# Patient Record
Sex: Female | Born: 1950 | Race: White | Hispanic: No | Marital: Married | Smoking: Never smoker
Health system: Southern US, Community
[De-identification: ages and names within clinical notes are randomized; demographics above are authoritative.]

## PROBLEM LIST (undated history)

## (undated) DIAGNOSIS — C50919 Malignant neoplasm of unspecified site of unspecified female breast: Secondary | ICD-10-CM

## (undated) DIAGNOSIS — C801 Malignant (primary) neoplasm, unspecified: Secondary | ICD-10-CM

## (undated) DIAGNOSIS — K219 Gastro-esophageal reflux disease without esophagitis: Secondary | ICD-10-CM

## (undated) DIAGNOSIS — M1990 Unspecified osteoarthritis, unspecified site: Secondary | ICD-10-CM

## (undated) DIAGNOSIS — K759 Inflammatory liver disease, unspecified: Secondary | ICD-10-CM

## (undated) DIAGNOSIS — R112 Nausea with vomiting, unspecified: Secondary | ICD-10-CM

## (undated) DIAGNOSIS — Z9889 Other specified postprocedural states: Secondary | ICD-10-CM

## (undated) HISTORY — PX: VULVECTOMY: SHX1086

## (undated) HISTORY — DX: Malignant neoplasm of unspecified site of unspecified female breast: C50.919

## (undated) HISTORY — DX: Malignant (primary) neoplasm, unspecified: C80.1

## (undated) HISTORY — PX: BREAST BIOPSY: SHX20

## (undated) HISTORY — PX: OTHER SURGICAL HISTORY: SHX169

---

## 1995-03-18 HISTORY — PX: ABDOMINAL HYSTERECTOMY: SHX81

## 2004-01-30 ENCOUNTER — Ambulatory Visit: Payer: Self-pay | Admitting: Unknown Physician Specialty

## 2004-02-21 ENCOUNTER — Ambulatory Visit: Payer: Self-pay | Admitting: Gynecologic Oncology

## 2004-03-01 ENCOUNTER — Ambulatory Visit: Payer: Self-pay | Admitting: Unknown Physician Specialty

## 2004-03-20 ENCOUNTER — Ambulatory Visit: Payer: Self-pay | Admitting: Oncology

## 2005-02-17 ENCOUNTER — Ambulatory Visit: Payer: Self-pay

## 2005-02-19 ENCOUNTER — Ambulatory Visit: Payer: Self-pay

## 2005-12-16 ENCOUNTER — Ambulatory Visit: Payer: Self-pay | Admitting: Vascular Surgery

## 2005-12-22 ENCOUNTER — Ambulatory Visit: Payer: Self-pay | Admitting: Unknown Physician Specialty

## 2006-01-28 ENCOUNTER — Ambulatory Visit: Payer: Self-pay

## 2006-11-06 ENCOUNTER — Ambulatory Visit: Payer: Self-pay | Admitting: Internal Medicine

## 2007-02-24 ENCOUNTER — Ambulatory Visit: Payer: Self-pay

## 2007-04-22 ENCOUNTER — Ambulatory Visit: Payer: Self-pay | Admitting: Internal Medicine

## 2007-04-23 ENCOUNTER — Ambulatory Visit: Payer: Self-pay | Admitting: Internal Medicine

## 2008-06-01 ENCOUNTER — Ambulatory Visit: Payer: Self-pay | Admitting: Internal Medicine

## 2008-09-13 ENCOUNTER — Ambulatory Visit: Payer: Self-pay

## 2008-09-19 IMAGING — CT CT ABD-PELV W/ CM
1 of 2 series · 15 of 32 positions shown, 19 images · non-contrast
Comparison: none

REASON FOR EXAM: abdominal pain   CALL report [phone]
COMMENTS:

PROCEDURE:     CT  - CT ABDOMEN / PELVIS  W  - December 16, 2005  [DATE]
RESULT:
HISTORY: Abdominal pain.

[Series 2: abdomen · axial · 0.56mm/px · z∈[-220,+196]mm · 15 of 58 slices shown, 19 images]
[im 3/58  soft-tissue]
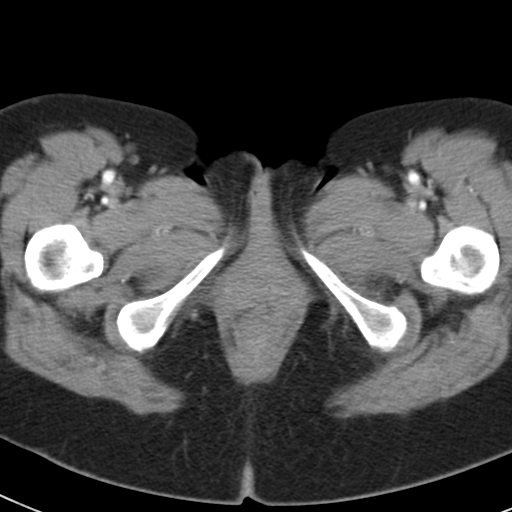
[im 3/58  bone]
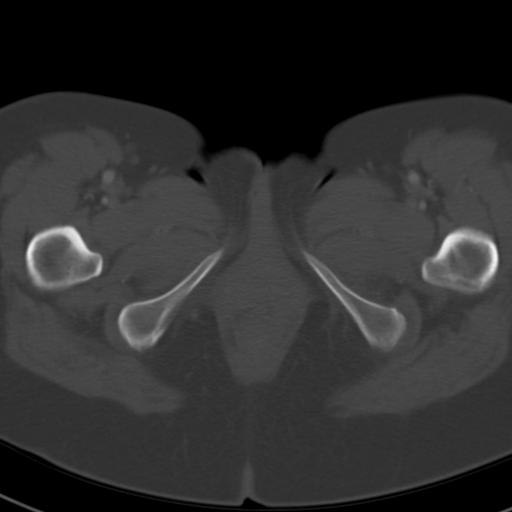
[im 7/58  soft-tissue]
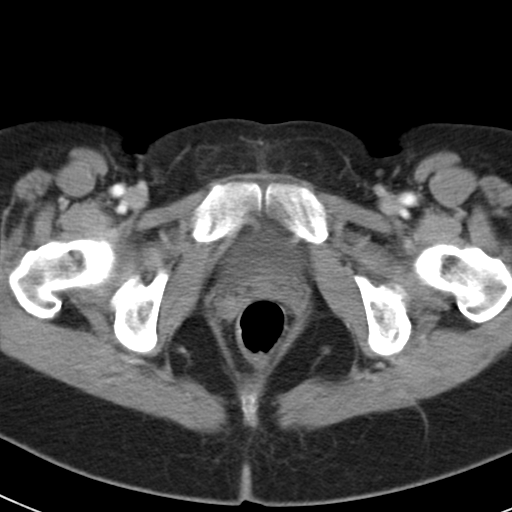
[im 11/58  soft-tissue]
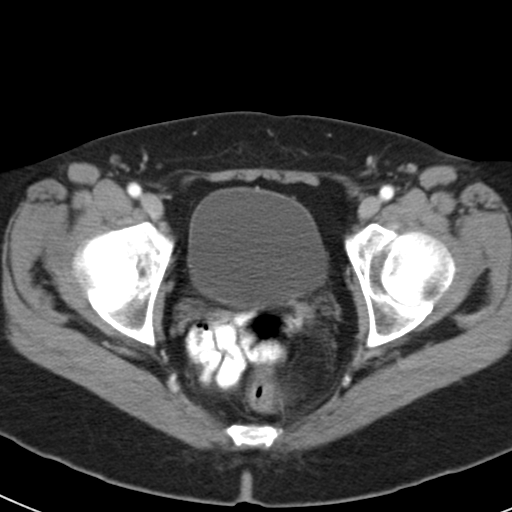
[im 16/58  soft-tissue]
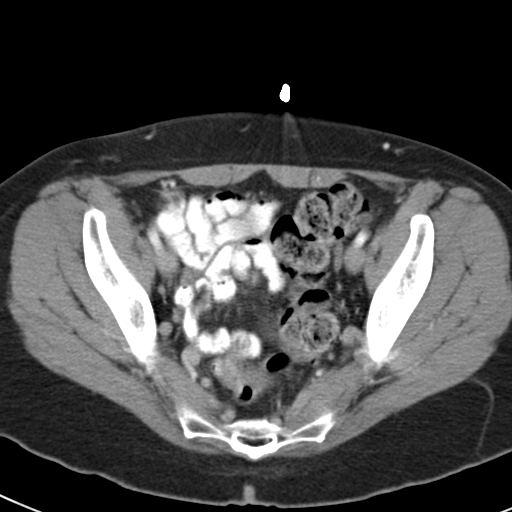
[im 20/58  soft-tissue]
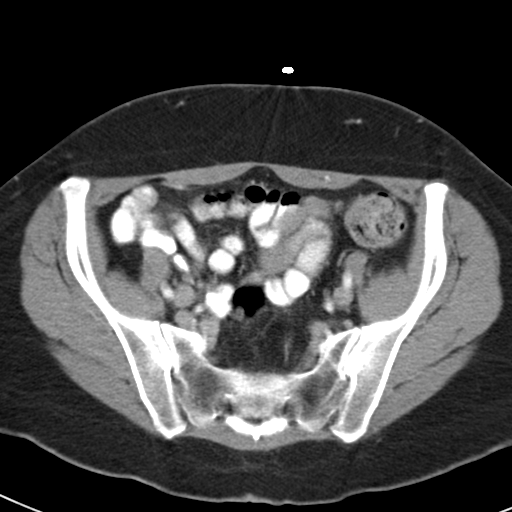
[im 25/58  soft-tissue]
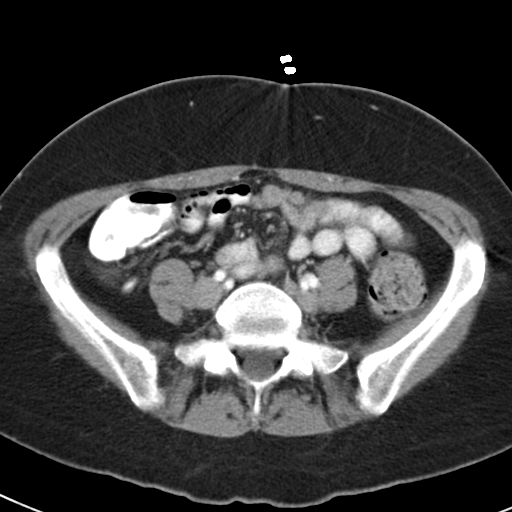
[im 29/58  soft-tissue]
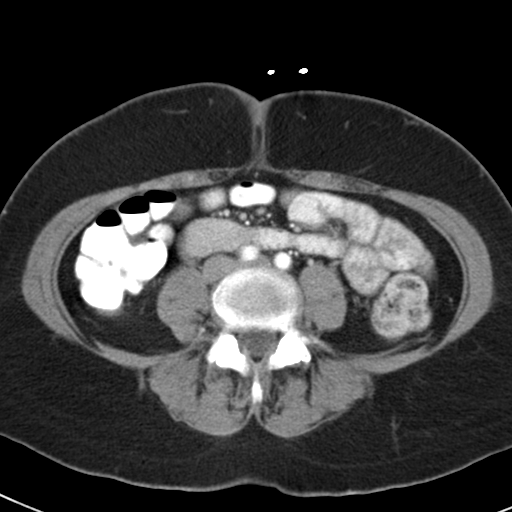
[im 33/58  soft-tissue]
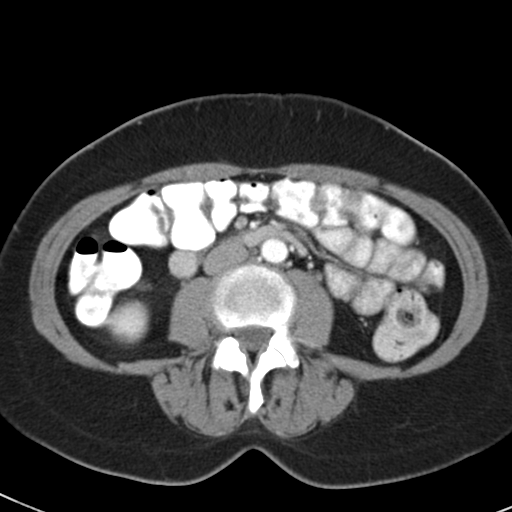
[im 38/58  soft-tissue]
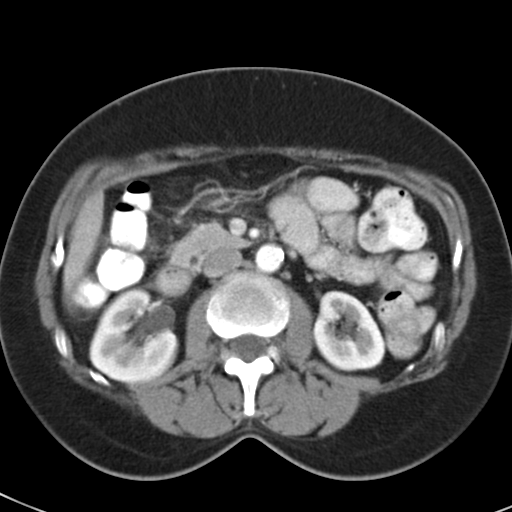
[im 38/58  bone]
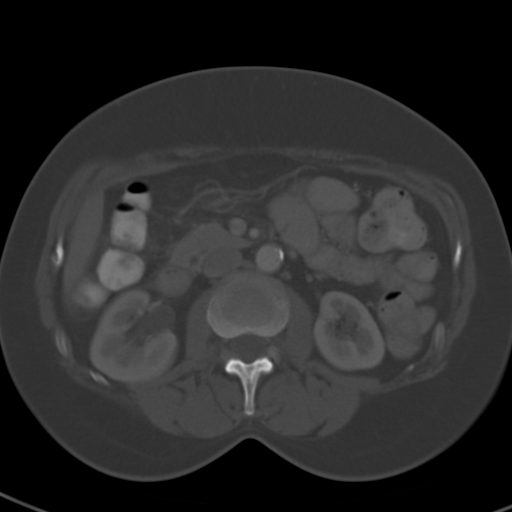
[im 42/58  soft-tissue]
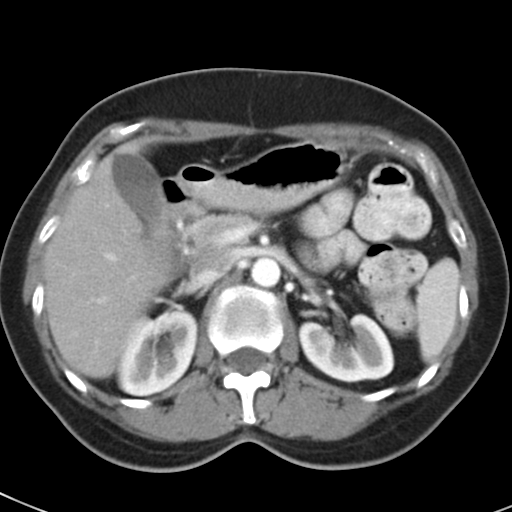
[im 47/58  soft-tissue]
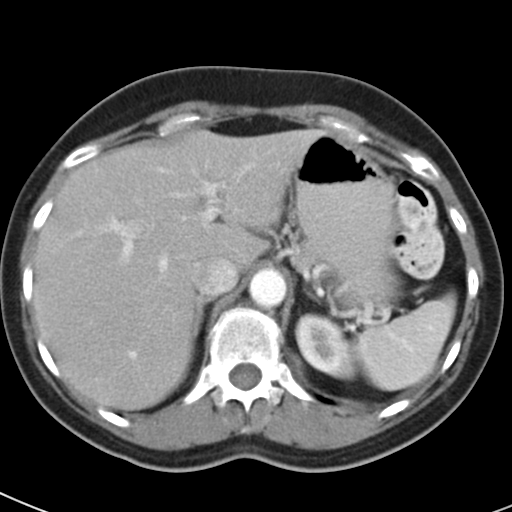
[im 49/58  lung]
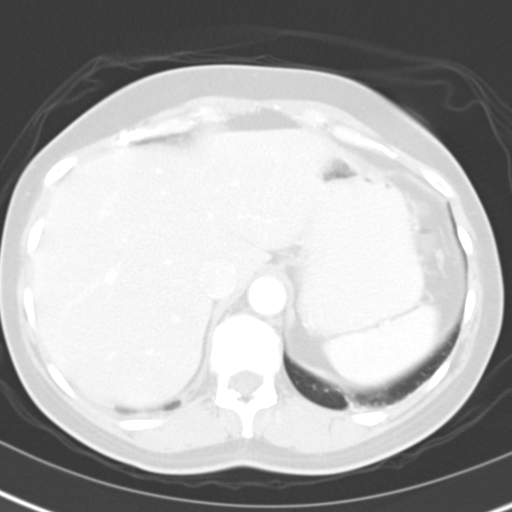
[im 51/58  soft-tissue]
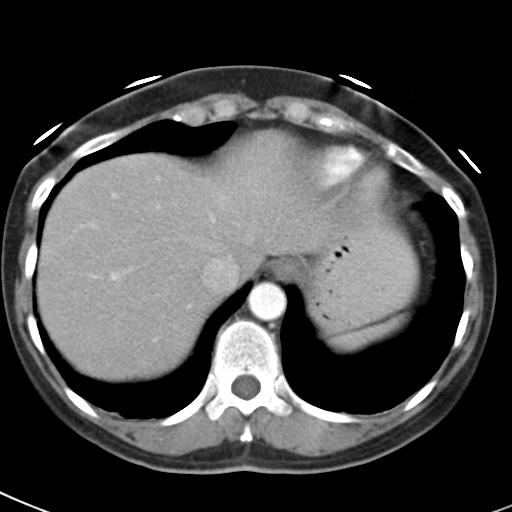
[im 51/58  lung]
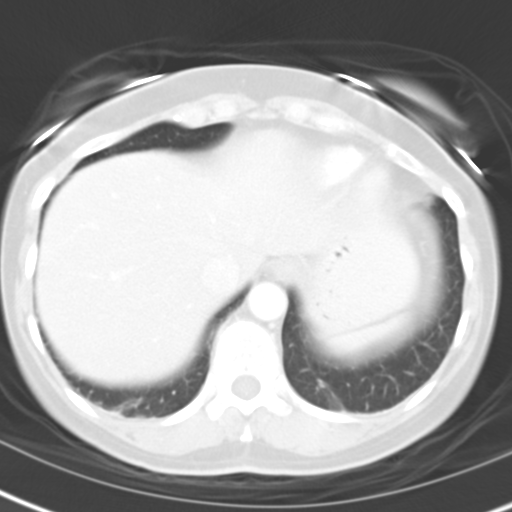
[im 53/58  lung]
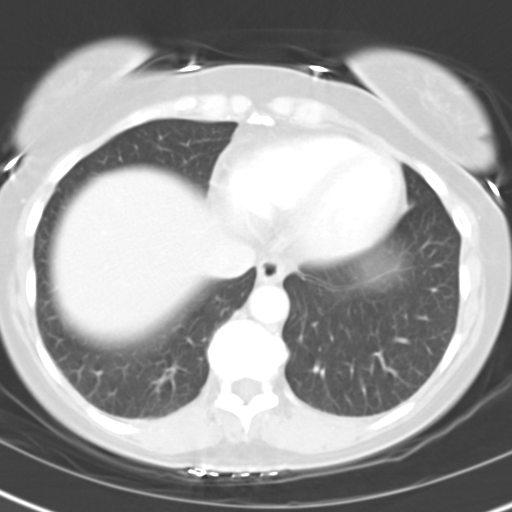
[im 55/58  soft-tissue]
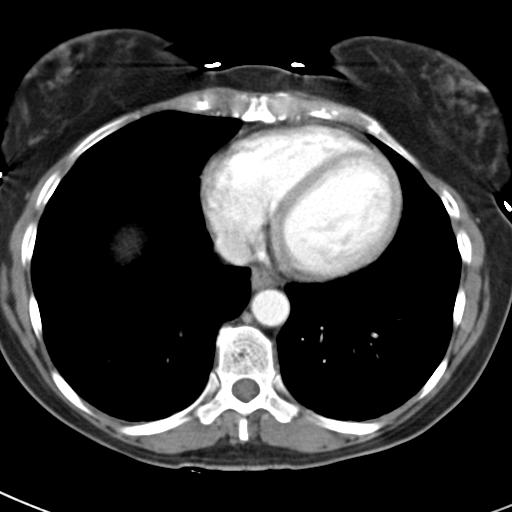
[im 55/58  lung]
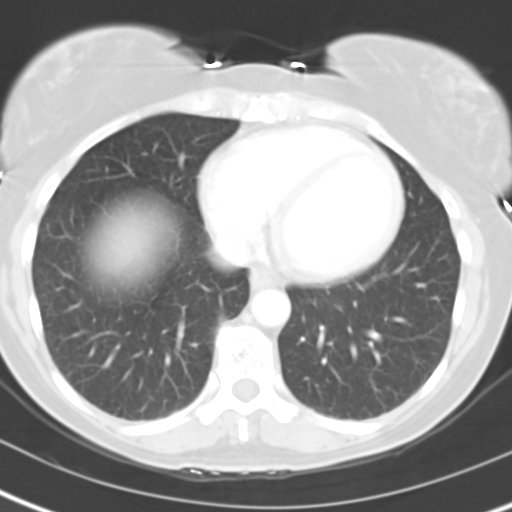

[15 of 32 positions shown; findings below may reference images not displayed]

FINDINGS: IV and oral contrast-enhanced CT scan of the abdomen and
pelvis is obtained.  The liver and spleen are normal.  The pancreas is
normal.  The gallbladder is not distended.  The adrenals are normal.  The
kidneys are normal.  There is no bowel distention.  The appendix is normal.
Stool is noted throughout the colon.  There is soft tissue thickening of the
rectosigmoid.  This could be related to infectious or inflammatory etiology,
including colitis.  However, malignancy cannot be excluded, and colonoscopy
is suggested with attention to the rectosigmoid.  No pelvic masses are
noted.  There is no inguinal adenopathy.  The bladder is unremarkable.
IMPRESSION: Bowel wall thickening of the rectosigmoid for which
colonoscopy is suggested for further evaluation.  Although this could be an
infectious or inflammatory etiology, colonoscopy is suggested to exclude a
malignancy involving the rectosigmoid.

This report was phoned to the patient's physician.

## 2009-09-19 ENCOUNTER — Ambulatory Visit: Payer: Self-pay | Admitting: Internal Medicine

## 2010-01-16 ENCOUNTER — Ambulatory Visit: Payer: Self-pay

## 2011-02-11 ENCOUNTER — Ambulatory Visit: Payer: Self-pay | Admitting: Internal Medicine

## 2011-02-14 ENCOUNTER — Ambulatory Visit: Payer: Self-pay | Admitting: Internal Medicine

## 2011-08-20 ENCOUNTER — Ambulatory Visit: Payer: Self-pay | Admitting: Internal Medicine

## 2012-10-14 ENCOUNTER — Ambulatory Visit: Payer: Self-pay | Admitting: Internal Medicine

## 2012-10-26 ENCOUNTER — Ambulatory Visit: Payer: Self-pay | Admitting: Internal Medicine

## 2012-11-29 ENCOUNTER — Ambulatory Visit: Payer: Self-pay | Admitting: Surgery

## 2012-12-20 ENCOUNTER — Ambulatory Visit: Payer: Self-pay | Admitting: Surgery

## 2012-12-30 ENCOUNTER — Other Ambulatory Visit (HOSPITAL_COMMUNITY): Payer: Self-pay | Admitting: Surgery

## 2012-12-30 DIAGNOSIS — R921 Mammographic calcification found on diagnostic imaging of breast: Secondary | ICD-10-CM

## 2013-01-26 ENCOUNTER — Ambulatory Visit (HOSPITAL_COMMUNITY): Payer: Commercial Managed Care - PPO

## 2013-01-26 ENCOUNTER — Ambulatory Visit (HOSPITAL_COMMUNITY)
Admission: RE | Admit: 2013-01-26 | Discharge: 2013-01-26 | Disposition: A | Discharge status: Home / Self Care | Payer: 59 | Source: Ambulatory Visit | Attending: Surgery | Admitting: Surgery

## 2013-01-26 DIAGNOSIS — R921 Mammographic calcification found on diagnostic imaging of breast: Secondary | ICD-10-CM

## 2013-01-26 DIAGNOSIS — Z803 Family history of malignant neoplasm of breast: Secondary | ICD-10-CM | POA: Insufficient documentation

## 2013-01-26 DIAGNOSIS — N63 Unspecified lump in unspecified breast: Secondary | ICD-10-CM | POA: Insufficient documentation

## 2013-01-26 MED ORDER — GADOBENATE DIMEGLUMINE 529 MG/ML IV SOLN
14.0000 mL | Freq: Once | INTRAVENOUS | Status: AC | PRN
  Administered 2013-01-26: 14 mL via INTRAVENOUS

## 2013-01-28 ENCOUNTER — Other Ambulatory Visit: Payer: Self-pay | Admitting: Surgery

## 2013-01-28 DIAGNOSIS — R928 Other abnormal and inconclusive findings on diagnostic imaging of breast: Secondary | ICD-10-CM

## 2013-02-08 ENCOUNTER — Ambulatory Visit
Admission: RE | Admit: 2013-02-08 | Discharge: 2013-02-08 | Disposition: A | Discharge status: Home / Self Care | Payer: 59 | Source: Ambulatory Visit | Attending: Surgery | Admitting: Surgery

## 2013-02-08 DIAGNOSIS — R928 Other abnormal and inconclusive findings on diagnostic imaging of breast: Secondary | ICD-10-CM

## 2013-02-08 MED ORDER — GADOBENATE DIMEGLUMINE 529 MG/ML IV SOLN
14.0000 mL | Freq: Once | INTRAVENOUS | Status: AC | PRN
  Administered 2013-02-08: 14 mL via INTRAVENOUS

## 2013-02-14 HISTORY — PX: TOTAL MASTECTOMY: SHX6129

## 2013-02-16 ENCOUNTER — Ambulatory Visit: Payer: Self-pay | Admitting: Cardiology

## 2013-02-24 ENCOUNTER — Ambulatory Visit: Payer: Self-pay | Admitting: Surgery

## 2013-03-08 ENCOUNTER — Ambulatory Visit: Payer: Self-pay | Admitting: Internal Medicine

## 2013-03-17 ENCOUNTER — Ambulatory Visit: Payer: Self-pay | Admitting: Internal Medicine

## 2013-04-18 ENCOUNTER — Ambulatory Visit: Payer: Self-pay | Admitting: Internal Medicine

## 2013-07-19 ENCOUNTER — Ambulatory Visit: Payer: Self-pay | Admitting: Internal Medicine

## 2013-08-15 ENCOUNTER — Ambulatory Visit: Payer: Self-pay | Admitting: Internal Medicine

## 2013-09-23 ENCOUNTER — Ambulatory Visit: Payer: Self-pay | Admitting: Unknown Physician Specialty

## 2013-10-17 ENCOUNTER — Ambulatory Visit: Payer: Self-pay | Admitting: Internal Medicine

## 2013-11-01 ENCOUNTER — Ambulatory Visit: Payer: Self-pay | Admitting: Internal Medicine

## 2013-11-01 LAB — CREATININE, SERUM
Creatinine: 0.76 mg/dL (ref 0.60–1.30)
EGFR (African American): >60
EGFR (Non-African Amer.): >60

## 2013-11-01 LAB — BUN: BUN: 13 mg/dL (ref 7–18)

## 2013-11-02 ENCOUNTER — Other Ambulatory Visit (HOSPITAL_COMMUNITY): Payer: Self-pay | Admitting: Internal Medicine

## 2013-11-02 DIAGNOSIS — C50911 Malignant neoplasm of unspecified site of right female breast: Secondary | ICD-10-CM

## 2013-11-02 DIAGNOSIS — R922 Inconclusive mammogram: Secondary | ICD-10-CM

## 2013-11-15 ENCOUNTER — Ambulatory Visit: Payer: Self-pay | Admitting: Internal Medicine

## 2014-02-14 DIAGNOSIS — C50919 Malignant neoplasm of unspecified site of unspecified female breast: Secondary | ICD-10-CM

## 2014-02-14 HISTORY — DX: Malignant neoplasm of unspecified site of unspecified female breast: C50.919

## 2014-05-18 ENCOUNTER — Ambulatory Visit
Admit: 2014-05-18 | Disposition: A | Discharge status: Home / Self Care | Payer: Self-pay | Attending: Hematology and Oncology | Admitting: Hematology and Oncology

## 2014-06-16 ENCOUNTER — Ambulatory Visit
Admit: 2014-06-16 | Disposition: A | Discharge status: Home / Self Care | Payer: Self-pay | Attending: Internal Medicine | Admitting: Internal Medicine

## 2014-06-27 ENCOUNTER — Encounter: Payer: Self-pay | Admitting: *Deleted

## 2014-07-07 NOTE — Op Note (Signed)
PATIENT NAME:  [name], [name] MR#:  [id number] DATE OF BIRTH:  07-20-1950  DATE OF PROCEDURE:  02/24/2013  PREOPERATIVE DIAGNOSIS: Right breast carcinoma.   POSTOPERATIVE DIAGNOSIS: Right breast carcinoma.   OPERATION: Right mastectomy with sentinel node biopsy.   ANESTHESIA: General.   SURGEON: Micheline Maze, MD  ASSISTANTBebe Liter, B and E student.   DESCRIPTION OF PROCEDURE: With the patient in the supine position, after the induction of appropriate general anesthesia and appropriate padding and positioning of the patient, the patient's right chest and upper arm were prepped with ChloraPrep and draped with sterile towels. A fishmouth incision was made around the nipple from the lateral aspect of the chest wall to just lateral to the sternum. Superior flap and inferior flap were created by using traction sutures of 3-0 silk, elevating the skin and dissecting the flap down to the chest wall. Superiorly, the flap was taken down to the second intercostal space. Inferiorly, it was taken down to the inframammary fold. The axilla was then interrogated with the Neoprobe. The background counts in the axilla were in the 10 to 20 range. The lymph node was identified with counts in the 1200 to 1300 range. The counts were over 5000 at the injection site. Single lymph node was identified and sent for touch prep, which were negative for macrometastasis. The area was irrigated. No other significant lymph nodes were identified with the Neoprobe. The breast was then swept off the chest wall using Bovie electrocautery. It was marked for pathology. The area was then copiously irrigated. Flat Jackson-Pratt drains were inserted through the inferior flap, one to the superior flap and one to the axilla. The incision was closed with interrupted vertical mattress sutures of 4-0 nylon. Drain was secured with 3-0 nylon. Sterile compressive dressings were applied. The patient was returned to the recovery room, having tolerated the  procedure well. Sponge, instrument and needle counts were correct x2 in the operating room.   ____________________________ Micheline Maze, MD rle:lb D: 02/24/2013 13:56:56 ET T: 02/24/2013 14:43:38 ET JOB#: [id number]  cc: Micheline Maze, MD, <Dictator> Halina Maidens, MD Rodena Goldmann MD ELECTRONICALLY SIGNED 02/25/2013 23:38

## 2014-09-19 ENCOUNTER — Other Ambulatory Visit: Payer: Self-pay

## 2014-09-19 DIAGNOSIS — C50919 Malignant neoplasm of unspecified site of unspecified female breast: Secondary | ICD-10-CM

## 2014-09-20 ENCOUNTER — Encounter: Payer: Self-pay | Admitting: Hematology and Oncology

## 2014-09-20 ENCOUNTER — Inpatient Hospital Stay (HOSPITAL_BASED_OUTPATIENT_CLINIC_OR_DEPARTMENT_OTHER): Payer: 59 | Admitting: Hematology and Oncology

## 2014-09-20 ENCOUNTER — Inpatient Hospital Stay: Payer: 59 | Attending: Hematology and Oncology

## 2014-09-20 DIAGNOSIS — Z8589 Personal history of malignant neoplasm of other organs and systems: Secondary | ICD-10-CM

## 2014-09-20 DIAGNOSIS — Z803 Family history of malignant neoplasm of breast: Secondary | ICD-10-CM

## 2014-09-20 DIAGNOSIS — Z79811 Long term (current) use of aromatase inhibitors: Secondary | ICD-10-CM | POA: Insufficient documentation

## 2014-09-20 DIAGNOSIS — C50911 Malignant neoplasm of unspecified site of right female breast: Secondary | ICD-10-CM | POA: Diagnosis not present

## 2014-09-20 DIAGNOSIS — Z170 Estrogen receptor positive status [ER+]: Secondary | ICD-10-CM | POA: Diagnosis not present

## 2014-09-20 DIAGNOSIS — M79671 Pain in right foot: Secondary | ICD-10-CM | POA: Insufficient documentation

## 2014-09-20 DIAGNOSIS — Z806 Family history of leukemia: Secondary | ICD-10-CM | POA: Insufficient documentation

## 2014-09-20 DIAGNOSIS — Z79899 Other long term (current) drug therapy: Secondary | ICD-10-CM | POA: Diagnosis not present

## 2014-09-20 DIAGNOSIS — M79672 Pain in left foot: Secondary | ICD-10-CM | POA: Insufficient documentation

## 2014-09-20 DIAGNOSIS — M2550 Pain in unspecified joint: Secondary | ICD-10-CM

## 2014-09-20 DIAGNOSIS — C50919 Malignant neoplasm of unspecified site of unspecified female breast: Secondary | ICD-10-CM

## 2014-09-20 LAB — COMPREHENSIVE METABOLIC PANEL
ALT: 18 U/L (ref 14–54)
AST: 22 U/L (ref 15–41)
Albumin: 4.1 g/dL (ref 3.5–5.0)
Alkaline Phosphatase: 65 U/L (ref 38–126)
Anion gap: 10 (ref 5–15)
BUN: 16 mg/dL (ref 6–20)
CO2: 25 mmol/L (ref 22–32)
Calcium: 9.7 mg/dL (ref 8.9–10.3)
Chloride: 103 mmol/L (ref 101–111)
Creatinine, Ser: 0.85 mg/dL (ref 0.44–1.00)
GFR calc Af Amer: >60 mL/min (ref >60)
GFR calc non Af Amer: >60 mL/min (ref >60)
Glucose, Bld: 110 mg/dL — ABNORMAL HIGH (ref 65–99)
Potassium: 4 mmol/L (ref 3.5–5.1)
Sodium: 138 mmol/L (ref 135–145)
Total Bilirubin: 0.4 mg/dL (ref 0.3–1.2)
Total Protein: 7.4 g/dL (ref 6.5–8.1)

## 2014-09-20 LAB — CBC WITH DIFFERENTIAL/PLATELET
Basophils Absolute: 0.1 10*3/uL (ref 0–0.1)
Basophils Relative: 1 %
Eosinophils Absolute: 0.3 10*3/uL (ref 0–0.7)
Eosinophils Relative: 5 %
HCT: 42.4 % (ref 35.0–47.0)
Hemoglobin: 14.5 g/dL (ref 12.0–16.0)
Lymphocytes Relative: 26 %
Lymphs Abs: 1.6 10*3/uL (ref 1.0–3.6)
MCH: 31.2 pg (ref 26.0–34.0)
MCHC: 34.2 g/dL (ref 32.0–36.0)
MCV: 91.4 fL (ref 80.0–100.0)
Monocytes Absolute: 0.9 10*3/uL (ref 0.2–0.9)
Monocytes Relative: 15 %
Neutro Abs: 3.4 10*3/uL (ref 1.4–6.5)
Neutrophils Relative %: 53 %
Platelets: 277 10*3/uL (ref 150–440)
RBC: 4.63 MIL/uL (ref 3.80–5.20)
RDW: 13.9 % (ref 11.5–14.5)
WBC: 6.3 10*3/uL (ref 3.6–11.0)

## 2014-09-21 LAB — CANCER ANTIGEN 27.29: CA 27.29: 22.6 U/mL (ref 0.0–38.6)

## 2014-11-02 ENCOUNTER — Ambulatory Visit: Payer: 59

## 2014-11-06 ENCOUNTER — Ambulatory Visit
Admission: RE | Admit: 2014-11-06 | Discharge: 2014-11-06 | Disposition: A | Discharge status: Home / Self Care | Payer: 59 | Source: Ambulatory Visit | Attending: Hematology and Oncology | Admitting: Hematology and Oncology

## 2014-11-06 ENCOUNTER — Other Ambulatory Visit: Payer: Self-pay | Admitting: Hematology and Oncology

## 2014-11-06 DIAGNOSIS — Z9011 Acquired absence of right breast and nipple: Secondary | ICD-10-CM | POA: Diagnosis not present

## 2014-11-06 DIAGNOSIS — C50911 Malignant neoplasm of unspecified site of right female breast: Secondary | ICD-10-CM

## 2014-11-06 DIAGNOSIS — Z853 Personal history of malignant neoplasm of breast: Secondary | ICD-10-CM | POA: Insufficient documentation

## 2014-11-06 DIAGNOSIS — Z1231 Encounter for screening mammogram for malignant neoplasm of breast: Secondary | ICD-10-CM | POA: Diagnosis not present

## 2014-12-05 ENCOUNTER — Telehealth: Payer: Self-pay | Admitting: *Deleted

## 2014-12-05 MED ORDER — EXEMESTANE 25 MG PO TABS: 25.0000 mg | ORAL_TABLET | Freq: Every day | ORAL | Status: DC

## 2014-12-05 NOTE — Telephone Encounter (Signed)
Escribed

## 2014-12-18 ENCOUNTER — Telehealth: Payer: Self-pay

## 2014-12-18 ENCOUNTER — Encounter: Payer: Self-pay | Admitting: Internal Medicine

## 2014-12-18 ENCOUNTER — Other Ambulatory Visit: Payer: Self-pay | Admitting: Internal Medicine

## 2014-12-18 DIAGNOSIS — L659 Nonscarring hair loss, unspecified: Secondary | ICD-10-CM | POA: Insufficient documentation

## 2014-12-18 DIAGNOSIS — Z86002 Personal history of in-situ neoplasm of other and unspecified genital organs: Secondary | ICD-10-CM | POA: Insufficient documentation

## 2014-12-18 DIAGNOSIS — R600 Localized edema: Secondary | ICD-10-CM

## 2014-12-18 DIAGNOSIS — Z853 Personal history of malignant neoplasm of breast: Secondary | ICD-10-CM | POA: Insufficient documentation

## 2014-12-18 DIAGNOSIS — B1910 Unspecified viral hepatitis B without hepatic coma: Secondary | ICD-10-CM

## 2014-12-18 DIAGNOSIS — Z8619 Personal history of other infectious and parasitic diseases: Secondary | ICD-10-CM | POA: Insufficient documentation

## 2014-12-18 DIAGNOSIS — C50919 Malignant neoplasm of unspecified site of unspecified female breast: Secondary | ICD-10-CM | POA: Insufficient documentation

## 2014-12-18 DIAGNOSIS — R609 Edema, unspecified: Secondary | ICD-10-CM | POA: Insufficient documentation

## 2014-12-18 DIAGNOSIS — K219 Gastro-esophageal reflux disease without esophagitis: Secondary | ICD-10-CM

## 2014-12-18 DIAGNOSIS — J3089 Other allergic rhinitis: Secondary | ICD-10-CM | POA: Insufficient documentation

## 2014-12-18 DIAGNOSIS — E785 Hyperlipidemia, unspecified: Secondary | ICD-10-CM | POA: Insufficient documentation

## 2014-12-18 DIAGNOSIS — C519 Malignant neoplasm of vulva, unspecified: Secondary | ICD-10-CM | POA: Insufficient documentation

## 2014-12-18 NOTE — Telephone Encounter (Signed)
done

## 2014-12-25 ENCOUNTER — Encounter: Payer: Self-pay | Admitting: Internal Medicine

## 2014-12-27 ENCOUNTER — Encounter: Payer: Self-pay | Admitting: Internal Medicine

## 2014-12-27 ENCOUNTER — Ambulatory Visit (INDEPENDENT_AMBULATORY_CARE_PROVIDER_SITE_OTHER): Payer: 59 | Admitting: Internal Medicine

## 2014-12-27 VITALS — BP 118/64 | HR 80 | Ht 66.0 in | Wt 168.0 lb

## 2014-12-27 DIAGNOSIS — K219 Gastro-esophageal reflux disease without esophagitis: Secondary | ICD-10-CM | POA: Diagnosis not present

## 2014-12-27 DIAGNOSIS — C50911 Malignant neoplasm of unspecified site of right female breast: Secondary | ICD-10-CM | POA: Diagnosis not present

## 2014-12-27 DIAGNOSIS — Z01818 Encounter for other preprocedural examination: Secondary | ICD-10-CM | POA: Diagnosis not present

## 2014-12-27 NOTE — Progress Notes (Signed)
Date:  12/27/2014   Name:  [name]   DOB:  January 06, 1951   MRN:  [id number]   Chief Complaint: Pre-op Exam  Patient is here for a preop examination. She's undergone mastectomy and treatment for breast cancer and is now planning tissue expander and prosthesis. Her plastic surgeon is just requesting that she have a preop checkup. Blood work was done recently through oncology and was normal. Prior to her mastectomy she had a cardiology workup and stress test which was also normal. She has occasional reflux for which she takes omeprazole 20 mg every other day. She denies early satiety, vomiting, blood in the stool or significant abdominal pain.   Review of Systems  Constitutional: Negative for fever, chills and fatigue.  HENT: Negative for hearing loss, tinnitus and trouble swallowing.   Eyes: Negative for visual disturbance.  Respiratory: Negative for choking, chest tightness and shortness of breath.   Cardiovascular: Negative for chest pain, palpitations and leg swelling.  Gastrointestinal: Negative for abdominal pain, diarrhea and constipation.       Reflux  Genitourinary: Negative for dysuria and hematuria.  Musculoskeletal: Positive for arthralgias. Negative for neck pain and neck stiffness. Back pain: feet.  Skin: Negative for color change and rash.  Allergic/Immunologic: Positive for environmental allergies.  Neurological: Negative for dizziness, light-headedness and headaches.  Hematological: Negative for adenopathy. Does not bruise/bleed easily.  Psychiatric/Behavioral: Negative for sleep disturbance and dysphoric mood.    Patient Active Problem List   Diagnosis Date Noted  . Laboratory animal allergy 12/18/2014  . 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 Alopecia 12/18/2014  . Edema extremities 12/18/2014  . Acid reflux 12/18/2014  . HBV (hepatitis B virus) infection 12/18/2014  . Malignant neoplasm of breast ([place]) 12/18/2014  . Hyperlipidemia, mild 12/18/2014  . Cancer of  right female breast ([place]) 09/20/2014    Prior to Admission medications   Medication Sig Start Date End Date Taking? Authorizing Provider  Calcium Carbonate-Vitamin D (CALCIUM + D PO) Take by mouth.   Yes Historical Provider, MD  exemestane (AROMASIN) 25 MG tablet Take 1 tablet (25 mg total) by mouth daily after breakfast. 12/05/14  Yes Lequita Asal, MD  fexofenadine (ALLEGRA) 180 MG tablet Take 1 tablet by mouth daily. 03/01/14  Yes Historical Provider, MD  glucosamine-chondroitin 500-400 MG tablet Take 1 tablet by mouth 2 (two) times daily.   Yes Historical Provider, MD  Magnesium 500 MG TABS Take by mouth.   Yes Historical Provider, MD  Melatonin 5 MG TABS Take 5 mg by mouth at bedtime as needed.   Yes Historical Provider, MD  MULTIPLE VITAMIN PO Take 1 tablet by mouth daily.   Yes Historical Provider, MD  Omega 3 1000 MG CAPS Take 1,000 mg by mouth 2 (two) times daily.   Yes Historical Provider, MD  omeprazole (PRILOSEC) 20 MG capsule Take 1 capsule by mouth daily. 03/01/14  Yes Historical Provider, MD  Probiotic Product (PROBIOTIC DAILY PO) Take 10 mg by mouth 2 (two) times daily.   Yes Historical Provider, MD  triamcinolone (NASACORT ALLERGY 24HR) 55 MCG/ACT AERO nasal inhaler Place into the nose. 03/29/14  Yes Historical Provider, MD  Turmeric Curcumin 500 MG CAPS Take 2 tablets by mouth.   Yes Historical Provider, MD    Allergies  Allergen Reactions  . Penicillins Rash  . Sulfa Antibiotics Rash    Past Surgical History  Procedure Laterality Date  . Abdominal hysterectomy    . Total mastectomy Right 02/2013  stage 1 ER/PR+  . Vulvectomy      Social History  Substance Use Topics  . Smoking status: Never Smoker   . Smokeless tobacco: Never Used  . Alcohol Use: 1.8 oz/week    3 Glasses of wine per week     Ref Range 25moago    WBC 3.6 - 11.0 K/uL 6.3   RBC 3.80 - 5.20 MIL/uL 4.63   Hemoglobin 12.0 - 16.0 g/dL 14.5   HCT 35.0 - 47.0 % 42.4   MCV 80.0 -  100.0 fL 91.4   MCH 26.0 - 34.0 pg 31.2   MCHC 32.0 - 36.0 g/dL 34.2   RDW 11.5 - 14.5 % 13.9   Platelets 150 - 440 K/uL 277   Neutrophils Relative % % 53   Neutro Abs 1.4 - 6.5 K/uL 3.4   Lymphocytes Relative % 26   Lymphs Abs 1.0 - 3.6 K/uL 1.6   Monocytes Relative % 15   Monocytes Absolute 0.2 - 0.9 K/uL 0.9   Eosinophils Relative % 5   Eosinophils Absolute 0 - 0.7 K/uL 0.3   Basophils Relative % 1   Basophils Absolute 0 - 0.1 K/uL 0.1   Resulting Agency [facility]       Specimen Collected: 09/20/14 11:09 AM            Ref Range 322mogo (09/20/14) 1y60yro (11/01/13) 45yr60yr (11/01/13)    Sodium 135 - 145 mmol/L 138      Potassium 3.5 - 5.1 mmol/L 4.0      Chloride 101 - 111 mmol/L 103      CO2 22 - 32 mmol/L 25      Glucose, Bld 65 - 99 mg/dL 110 (H)      BUN 6 - 20 mg/dL 16 13R     Creatinine, Ser 0.44 - 1.00 mg/dL 0.85  0.76R    Calcium 8.9 - 10.3 mg/dL 9.7      Total Protein 6.5 - 8.1 g/dL 7.4      Albumin 3.5 - 5.0 g/dL 4.1      AST 15 - 41 U/L 22      ALT 14 - 54 U/L 18      Alkaline Phosphatase 38 - 126 U/L 65      Total Bilirubin 0.3 - 1.2 mg/dL 0.4      GFR calc non Af Amer >60 mL/min >60  >60R, CM    GFR calc Af Amer >60 mL/min >60  >60R   Comments: (NOTE)  The eGFR has been calculated using the CKD EPI equation.  This calculation has not been validated in all clinical situations.  eGFR's persistently <60 mL/min signify possible Chronic Kidney  Disease.      Anion gap 5 - 15  10     Resulting Agency  [facility]      Specimen Collected: 09/20/14 11:09 AM          Medication list has been reviewed and updated.   Physical Exam  Constitutional: She is oriented to person, place, and time. She appears well-developed and well-nourished. No distress.  HENT:  Head: Normocephalic and atraumatic.  Eyes: Conjunctivae are normal. Pupils are equal, round, and reactive to light. Right  eye exhibits no discharge. Left eye exhibits no discharge. No scleral icterus.  Neck: Normal range of motion. Neck supple. Carotid bruit is not present. No thyromegaly present.  Cardiovascular: Normal rate, regular rhythm, normal heart sounds and intact distal pulses.  Pulmonary/Chest: Effort normal and breath sounds normal. No respiratory distress.  Abdominal: Soft. Normal appearance and bowel sounds are normal. There is no hepatosplenomegaly. There is no tenderness. There is no rebound.  Musculoskeletal: Normal range of motion.       Right knee: Normal.       Left knee: Normal.  Lymphadenopathy:    She has no cervical adenopathy.  Neurological: She is alert and oriented to person, place, and time. She has normal reflexes.  Skin: Skin is warm and dry. No rash noted.  Psychiatric: She has a normal mood and affect. Her behavior is normal. Thought content normal.    BP 118/64 mmHg  Pulse 80  Ht 5' 6"  (1.676 m)  Wt 168 lb (76.204 kg)  BMI 27.13 kg/m2  Assessment and Plan: 1. Encounter for preoperative examination for general surgical procedure Normal physical exam with normal recent labs Should be cleared to proceed  2. Malignant neoplasm of right female breast, unspecified site of breast [facility]) Planning tissue expander/prosthesis and contralateral breast reduction  3. Gastroesophageal reflux disease, esophagitis presence not specified Continue omeprazole as needed   Halina Maidens, MD [employer]  12/27/2014

## 2015-01-10 ENCOUNTER — Other Ambulatory Visit: Payer: Self-pay | Admitting: *Deleted

## 2015-01-10 NOTE — Patient Outreach (Signed)
[facility]) Care Management  01/10/2015  [name] 1950-09-16 [id number]  RN spoke with pt today and introduced the [facility] program and services for [facility] members. Pt further explained her request for the benefit exception due to her residential distance from the [place] and opt to have an upcoming procedure at the [facility] in [place]. Pt states she would has had interaction with Dr. Tula Nakayama prior and would prefer to remain with this provider for requested mastectomy reconstruction surgery planned for Nov. Pt has expressed that she is closer to the [place] location for this procedure then coming to the [facility] here in [place], [place]. RN verified the present paperwork for CPT coding on the upcoming procedure and will update pt accordingly on any addition information or progress on this case.  Member appreciative for the assistance.   Will inquire further on the specifics with [facility] concerning networking facility/providers closer to the [place] location for the requested CPT codes on this requested procedure.   Raina Mina, RN Care Management Coordinator [facility] Main Office [phone]

## 2015-01-12 ENCOUNTER — Encounter: Payer: Self-pay | Admitting: *Deleted

## 2015-01-15 ENCOUNTER — Encounter: Payer: Self-pay | Admitting: Anesthesiology

## 2015-01-16 HISTORY — PX: REDUCTION MAMMAPLASTY: SUR839

## 2015-01-16 NOTE — Discharge Instructions (Signed)

## 2015-01-18 ENCOUNTER — Encounter
Admission: RE | Disposition: A | Discharge status: Home / Self Care | Payer: Self-pay | Source: Ambulatory Visit | Attending: Plastic Surgery

## 2015-01-18 ENCOUNTER — Ambulatory Visit: Payer: 59 | Admitting: Certified Registered"

## 2015-01-18 ENCOUNTER — Ambulatory Visit
Admission: RE | Admit: 2015-01-18 | Discharge: 2015-01-18 | Disposition: A | Discharge status: Home / Self Care | Payer: 59 | Source: Ambulatory Visit | Attending: Plastic Surgery | Admitting: Plastic Surgery

## 2015-01-18 DIAGNOSIS — Z853 Personal history of malignant neoplasm of breast: Secondary | ICD-10-CM | POA: Insufficient documentation

## 2015-01-18 DIAGNOSIS — Z9011 Acquired absence of right breast and nipple: Secondary | ICD-10-CM | POA: Diagnosis not present

## 2015-01-18 DIAGNOSIS — Z880 Allergy status to penicillin: Secondary | ICD-10-CM | POA: Insufficient documentation

## 2015-01-18 DIAGNOSIS — K219 Gastro-esophageal reflux disease without esophagitis: Secondary | ICD-10-CM | POA: Insufficient documentation

## 2015-01-18 DIAGNOSIS — Z90710 Acquired absence of both cervix and uterus: Secondary | ICD-10-CM | POA: Insufficient documentation

## 2015-01-18 DIAGNOSIS — Z8619 Personal history of other infectious and parasitic diseases: Secondary | ICD-10-CM | POA: Insufficient documentation

## 2015-01-18 DIAGNOSIS — Z79899 Other long term (current) drug therapy: Secondary | ICD-10-CM | POA: Diagnosis not present

## 2015-01-18 DIAGNOSIS — Z882 Allergy status to sulfonamides status: Secondary | ICD-10-CM | POA: Insufficient documentation

## 2015-01-18 DIAGNOSIS — N6489 Other specified disorders of breast: Secondary | ICD-10-CM | POA: Diagnosis not present

## 2015-01-18 DIAGNOSIS — Z8589 Personal history of malignant neoplasm of other organs and systems: Secondary | ICD-10-CM | POA: Diagnosis not present

## 2015-01-18 HISTORY — PX: TISSUE EXPANDER PLACEMENT: SHX2530

## 2015-01-18 HISTORY — PX: BREAST REDUCTION SURGERY: SHX8

## 2015-01-18 HISTORY — DX: Other specified postprocedural states: Z98.890

## 2015-01-18 HISTORY — DX: Nausea with vomiting, unspecified: R11.2

## 2015-01-18 SURGERY — INSERTION, TISSUE EXPANDER
Anesthesia: General | Laterality: Right | Wound class: Clean

## 2015-01-18 MED ORDER — OXYCODONE HCL 5 MG/5ML PO SOLN: 5.0000 mg | Freq: Once | ORAL | Status: AC | PRN

## 2015-01-18 MED ORDER — MIDAZOLAM HCL 5 MG/5ML IJ SOLN
INTRAMUSCULAR | Status: DC | PRN
  Administered 2015-01-18: 2 mg via INTRAVENOUS

## 2015-01-18 MED ORDER — OXYCODONE HCL 5 MG PO TABS
5.0000 mg | ORAL_TABLET | Freq: Once | ORAL | Status: AC | PRN
  Administered 2015-01-18: 5 mg via ORAL

## 2015-01-18 MED ORDER — BUPIVACAINE-EPINEPHRINE (PF) 0.25% -1:200000 IJ SOLN
INTRAMUSCULAR | Status: DC | PRN
  Administered 2015-01-18: 100 mL via INTRAMUSCULAR

## 2015-01-18 MED ORDER — ONDANSETRON HCL 4 MG/2ML IJ SOLN
INTRAMUSCULAR | Status: DC | PRN
  Administered 2015-01-18: 4 mg via INTRAVENOUS

## 2015-01-18 MED ORDER — FENTANYL CITRATE (PF) 100 MCG/2ML IJ SOLN
INTRAMUSCULAR | Status: DC | PRN
  Administered 2015-01-18 (×3): 25 ug via INTRAVENOUS
  Administered 2015-01-18 (×2): 50 ug via INTRAVENOUS
  Administered 2015-01-18: 25 ug via INTRAVENOUS

## 2015-01-18 MED ORDER — PROPOFOL 10 MG/ML IV BOLUS
INTRAVENOUS | Status: DC | PRN
  Administered 2015-01-18: 200 mg via INTRAVENOUS

## 2015-01-18 MED ORDER — LIDOCAINE HCL (CARDIAC) 20 MG/ML IV SOLN
INTRAVENOUS | Status: DC | PRN
  Administered 2015-01-18: 40 mg via INTRATRACHEAL

## 2015-01-18 MED ORDER — VANCOMYCIN HCL 1000 MG IV SOLR
1000.0000 mg | Freq: Once | INTRAVENOUS | Status: AC
  Administered 2015-01-18: 1000 mg via INTRAVENOUS

## 2015-01-18 MED ORDER — LACTATED RINGERS IV SOLN
INTRAVENOUS | Status: DC
  Administered 2015-01-18: 07:00:00 via INTRAVENOUS

## 2015-01-18 MED ORDER — ACETAMINOPHEN 10 MG/ML IV SOLN
1000.0000 mg | Freq: Once | INTRAVENOUS | Status: AC
  Administered 2015-01-18: 1000 mg via INTRAVENOUS

## 2015-01-18 MED ORDER — HYDROMORPHONE HCL 1 MG/ML IJ SOLN
0.2500 mg | INTRAMUSCULAR | Status: DC | PRN
  Administered 2015-01-18: 0.5 mg via INTRAVENOUS
  Administered 2015-01-18 (×2): 0.25 mg via INTRAVENOUS

## 2015-01-18 MED ORDER — METHYLENE BLUE 1 % INJ SOLN
INTRAMUSCULAR | Status: DC | PRN
  Administered 2015-01-18: 1 mL via INTRADERMAL

## 2015-01-18 MED ORDER — GABAPENTIN 300 MG PO CAPS
300.0000 mg | ORAL_CAPSULE | Freq: Once | ORAL | Status: AC
  Administered 2015-01-18: 300 mg via ORAL

## 2015-01-18 MED ORDER — PROMETHAZINE HCL 25 MG/ML IJ SOLN: 6.2500 mg | INTRAMUSCULAR | Status: DC | PRN

## 2015-01-18 MED ORDER — SODIUM CHLORIDE 0.9 % IR SOLN
Status: DC | PRN
  Administered 2015-01-18: 10 mL

## 2015-01-18 MED ORDER — SCOPOLAMINE 1 MG/3DAYS TD PT72
1.0000 | MEDICATED_PATCH | TRANSDERMAL | Status: DC
  Administered 2015-01-18: 1.5 mg via TRANSDERMAL

## 2015-01-18 MED ORDER — MEPERIDINE HCL 25 MG/ML IJ SOLN: 6.2500 mg | INTRAMUSCULAR | Status: DC | PRN

## 2015-01-18 MED ORDER — DEXAMETHASONE SODIUM PHOSPHATE 4 MG/ML IJ SOLN
INTRAMUSCULAR | Status: DC | PRN
  Administered 2015-01-18: 8 mg via INTRAVENOUS

## 2015-01-18 SURGICAL SUPPLY — 66 items
ALLODERM 132 THICK RTU PERF (Tissue) ×1 IMPLANT
ALLODERM 132CM THICK RTU PERF (Tissue) ×1 IMPLANT
ALLODERM SELECT RTU 132 THK PF (Tissue) ×2 IMPLANT
BACTOSHIELD CHG 4% 4OZ (MISCELLANEOUS) ×6
BASIN GRAD PLASTIC 32OZ STRL (MISCELLANEOUS) ×4 IMPLANT
BLADE BOVIE TIP EXT 4 (BLADE) IMPLANT
BLADE SURG 15 STRL LF DISP TIS (BLADE) ×2 IMPLANT
BLADE SURG 15 STRL SS (BLADE) ×2
BLADE SURG SZ10 CARB STEEL (BLADE) ×8 IMPLANT
BNDG GAUZE 4.5X4.1 6PLY STRL (MISCELLANEOUS) ×8 IMPLANT
CANISTER SUCT 1200ML W/VALVE (MISCELLANEOUS) ×4 IMPLANT
CLOSURE WOUND 1/2 X4 (GAUZE/BANDAGES/DRESSINGS) ×4
CNTNR SPEC 2.5X3XGRAD LEK (MISCELLANEOUS) ×2
CONT SPEC 4OZ STER OR WHT (MISCELLANEOUS) ×2
CONTAINER SPEC 2.5X3XGRAD LEK (MISCELLANEOUS) ×2 IMPLANT
COVER LIGHT HANDLE FLEXIBLE (MISCELLANEOUS) ×8 IMPLANT
DRAIN BLAKE 15F W/TROCAR (DRAIN) ×12 IMPLANT
DRAPE CHEST BREAST 15X10 FENES (DRAPES) ×4 IMPLANT
DRAPE SHEET LG 3/4 BI-LAMINATE (DRAPES) ×4 IMPLANT
DRSG TEGADERM 2-3/8X2-3/4 SM (GAUZE/BANDAGES/DRESSINGS) ×8 IMPLANT
DRSG TEGADERM 4X4.75 (GAUZE/BANDAGES/DRESSINGS) ×28 IMPLANT
ELECT CAUTERY BLADE TIP 2.5 (TIP) ×8
ELECTRODE CAUTERY BLDE TIP 2.5 (TIP) ×4 IMPLANT
GAUZE SPONGE 4X4 12PLY STRL (GAUZE/BANDAGES/DRESSINGS) ×4 IMPLANT
GLOVE BIO SURGEON STRL SZ7.5 (GLOVE) ×8 IMPLANT
GOWN STRL REUS W/ TWL LRG LVL3 (GOWN DISPOSABLE) ×6 IMPLANT
GOWN STRL REUS W/TWL LRG LVL3 (GOWN DISPOSABLE) ×6
IV SET PRIMARY 15D 139IN B9900 (IV SETS) ×4 IMPLANT
LIQUID BAND (GAUZE/BANDAGES/DRESSINGS) ×8 IMPLANT
MARKER SKIN SURG W/RULER VIO (MISCELLANEOUS) ×8 IMPLANT
NDL HYPO TW 22X1.5 (NEEDLE) ×4 IMPLANT
NEEDLE HYPO 18GX1.5 BLUNT FILL (NEEDLE) ×16 IMPLANT
NS IRRIG 500ML POUR BTL (IV SOLUTION) ×12 IMPLANT
PACK BASIN MINOR ARMC (MISCELLANEOUS) ×4 IMPLANT
PAD ABD DERMACEA PRESS 5X9 (GAUZE/BANDAGES/DRESSINGS) IMPLANT
SCRUB CHG 4% DYNA-HEX 4OZ (MISCELLANEOUS) ×6 IMPLANT
SOL PREP PVP 2OZ (MISCELLANEOUS) ×4
SOLUTION PREP PVP 2OZ (MISCELLANEOUS) ×2 IMPLANT
SPONGE LAP 18X18 5 PK (GAUZE/BANDAGES/DRESSINGS) ×12 IMPLANT
STAPLER SKIN PROX 35W (STAPLE) ×4 IMPLANT
STOPCOCK 4WAY DISCOFIX (IV SETS) ×4 IMPLANT
STRAP BODY AND KNEE 60X3 (MISCELLANEOUS) ×4 IMPLANT
STRIP CLOSURE SKIN 1/2X4 (GAUZE/BANDAGES/DRESSINGS) ×12 IMPLANT
SUCT RESERVOIR 100CC (MISCELLANEOUS) ×8 IMPLANT
SUT GTX CV3 36 (SUTURE) IMPLANT
SUT MNCRL 4-0 (SUTURE) ×4
SUT MNCRL 4-0 27XMFL (SUTURE) ×4
SUT MON AB 3-0 SH 27 (SUTURE) IMPLANT
SUT PDS 2-0 27IN (SUTURE) IMPLANT
SUT PDS 3 0 CT 1 27 (SUTURE) ×8 IMPLANT
SUT PDSII 0 (SUTURE) ×8 IMPLANT
SUT SILK 2 0 SH (SUTURE) ×8 IMPLANT
SUT VIC AB 2-0 CT1 27 (SUTURE) ×2
SUT VIC AB 2-0 CT1 TAPERPNT 27 (SUTURE) ×2 IMPLANT
SUT VIC AB 3-0 SH 27 (SUTURE)
SUT VIC AB 3-0 SH 27X BRD (SUTURE) IMPLANT
SUT VICRYL 3-0 CR8 SH (SUTURE) ×4 IMPLANT
SUTURE MNCRL 4-0 27XMF (SUTURE) ×4 IMPLANT
SUTURE PDS 2-0 II CT-1 (SUTURE) IMPLANT
SYR 20CC LL (SYRINGE) ×4 IMPLANT
SYR BULB IRRIG 60ML STRL (SYRINGE) ×4 IMPLANT
TISSUE ALLDRM SELECT RTU 132 (Tissue) ×2 IMPLANT
TOWEL OR 17X26 4PK STRL BLUE (TOWEL DISPOSABLE) ×8 IMPLANT
Tissue expander (Expander) ×4 IMPLANT
UNDERPAD 30X60 958B10 (PK) (MISCELLANEOUS) ×8 IMPLANT
WATER STERILE IRR 500ML POUR (IV SOLUTION) ×4 IMPLANT

## 2015-01-18 NOTE — OR Nursing (Signed)
A total of 0.335kg of left breast tissue was removed and sent as surgical pathology specimen.

## 2015-01-18 NOTE — Transfer of Care (Signed)
Immediate Anesthesia Transfer of Care Note  Patient: [name]  Procedure(s) Performed: Procedure(s) with comments: Right  BREAST EXPANDER PLACEMENT WITH ALLODERM (Right) - DR Tula Nakayama RM 2 PER BRENDA MAMMARY REDUCTION  (BREAST) (Left)  Patient Location: PACU  Anesthesia Type: General  Level of Consciousness: awake, alert  and patient cooperative  Airway and Oxygen Therapy: Patient Spontanous Breathing and Patient connected to supplemental oxygen  Post-op Assessment: Post-op Vital signs reviewed, Patient's Cardiovascular Status Stable, Respiratory Function Stable, Patent Airway and No signs of Nausea or vomiting  Post-op Vital Signs: Reviewed and stable  Complications: No apparent anesthesia complications

## 2015-01-18 NOTE — Anesthesia Preprocedure Evaluation (Signed)
Anesthesia Evaluation  Patient identified by MRN, date of birth, ID band Patient awake    Reviewed: Allergy & Precautions, NPO status , Patient's Chart, lab work & pertinent test results, reviewed documented beta blocker date and time   History of Anesthesia Complications (+) PONV and history of anesthetic complications  Airway Mallampati: II  TM Distance: >3 FB Neck ROM: Full    Dental no notable dental hx.    Pulmonary neg pulmonary ROS,    Pulmonary exam normal        Cardiovascular negative cardio ROS Normal cardiovascular exam     Neuro/Psych negative neurological ROS     GI/Hepatic GERD  ,(+) Hepatitis -, B  Endo/Other  negative endocrine ROS  Renal/GU negative Renal ROS  negative genitourinary   Musculoskeletal negative musculoskeletal ROS (+)   Abdominal   Peds negative pediatric ROS (+)  Hematology negative hematology ROS (+)   Anesthesia Other Findings   Reproductive/Obstetrics negative OB ROS                             Anesthesia Physical Anesthesia Plan  ASA: II  Anesthesia Plan: General   Post-op Pain Management:    Induction: Intravenous  Airway Management Planned:   Additional Equipment:   Intra-op Plan:   Post-operative Plan:   Informed Consent: I have reviewed the patients History and Physical, chart, labs and discussed the procedure including the risks, benefits and alternatives for the proposed anesthesia with the patient or authorized representative who has indicated his/her understanding and acceptance.     Plan Discussed with: CRNA  Anesthesia Plan Comments:         Anesthesia Quick Evaluation

## 2015-01-18 NOTE — H&P (Signed)
  The current H&P has been reviewed and updated. It will be scanned in at a later date.  

## 2015-01-18 NOTE — Anesthesia Postprocedure Evaluation (Signed)
  Anesthesia Post-op Note  Patient: [name]  Procedure(s) Performed: Procedure(s) with comments: Right  BREAST EXPANDER PLACEMENT WITH ALLODERM (Right) - DR Tula Nakayama RM 2 PER BRENDA MAMMARY REDUCTION  (BREAST) (Left)  Anesthesia type:General  Patient location: PACU  Post pain: Pain level controlled  Post assessment: Post-op Vital signs reviewed, Patient's Cardiovascular Status Stable, Respiratory Function Stable, Patent Airway and No signs of Nausea or vomiting  Post vital signs: Reviewed and stable  Last Vitals:  Filed Vitals:   01/18/15 1130  BP: 144/81  Pulse: 90  Temp:   Resp: 16    Level of consciousness: awake, alert  and patient cooperative  Complications: No apparent anesthesia complications

## 2015-01-18 NOTE — Anesthesia Procedure Notes (Signed)
Procedure Name: LMA Insertion Date/Time: 01/18/2015 7:37 AM Performed by: Londell Moh Pre-anesthesia Checklist: Patient identified, Emergency Drugs available, Suction available, Timeout performed and Patient being monitored Patient Re-evaluated:Patient Re-evaluated prior to inductionOxygen Delivery Method: Circle system utilized Preoxygenation: Pre-oxygenation with 100% oxygen Intubation Type: IV induction LMA: LMA inserted LMA Size: 4.0 Number of attempts: 1 Placement Confirmation: positive ETCO2 and breath sounds checked- equal and bilateral Tube secured with: Tape

## 2015-01-19 ENCOUNTER — Encounter: Payer: Self-pay | Admitting: Plastic Surgery

## 2015-01-19 NOTE — Op Note (Signed)
[name], [name]                ACCOUNT NO.:  [id number]  MEDICAL RECORD NO.:  [id number]  LOCATION:  [facility]                        FACILITY:  [facility]  PHYSICIAN:  Cleda Daub, MD       DATE OF BIRTH:  June 03, 1950  DATE OF PROCEDURE:  01/18/2015 DATE OF DISCHARGE:  01/18/2015                              OPERATIVE REPORT   PREOPERATIVE DIAGNOSIS:  Previous history of right breast cancer status post mastectomy with asymmetry.  POSTOPERATIVE DIAGNOSIS:  Previous history of right breast cancer status post mastectomy with asymmetry.  PROCEDURE: 1. Left breast reduction (superomedial pedicle short scar). 2. Right breast delayed reconstruction with tissue expander and     acellular dermal matrix.  STAFF SURGEON:  Cleda Daub, MD.  ASSISTANT:  Marland Kitchen.  EBL:  Less than 50 mL.  COMPLICATIONS:  None.  DRAINS:  A 15-French Blake drain x3, one in the left and two on the right.  SPECIMENS:  Left breast 335 g.  DISPOSITION:  Stable to recovery.  STATEMENT OF NECESSITY:  The patient is a very pleasant 64 year old female who has had a right mastectomy and has a significant breast asymmetry with macromastia on the left and ptosis.  She desires a smaller breasts and requests reconstruction.  We have discussed the procedure, the risks, the benefits, and the alternatives.  We have utilized ASPS consent forms to discuss the common and uncommon complications and she has requested the procedures as described.  STATEMENT OF PROCEDURE:  The patient was brought to the operating room, awake, alert, and comfortable, and placed on the OR table in the supine position with both arms outstretched.  After anesthesia had been introduced, she was infiltrated with dilute mixture of lidocaine and Marcaine and marked with Methylene Blue according to the preoperative markings.  She was then prepped and draped in the usual sterile fashion.  Directing attention first towards the left breast.  A 45 mm  Cookie cutter was used to circumscribe the nipple areolar complex.  A 6 cm wide superior medial pedicle was de-epithelialized and then a mosque pattern was deepened sharply all the way around.  Skin flaps were elevated superiorly in a fairly thick fashion and medial and laterally creating a medial and lateral pillar.  The inferior portion of the breast was grasped with double hooks and dissected in a subcutaneous facelift plane.  The intervening tissue in the inferior pole was swept off the pectoralis fascia and then a C-shaped specimen was resected around the pedicle.  Care was taken to preserve the medial perforators and the attachments medially.  A small amount of extra resection was performed superiorly and at the triple point.  Hemostasis was confirmed.  A 15- [place] Blake drain was brought out through a stab incision.  The medial and lateral pillars were then reapproximated with interrupted 3-0 PDS sutures creating a projected shape with a smaller base diameter.  The nipple was then inset with 3-0 Vicryl and the closure was completed with 4-0 Monocryl.  On the right side, the reconstruction was performed at this point.  The previous mastectomy scar was excised and skin flaps were elevated superiorly and inferiorly.  The pectoralis was  divided along the inframammary fold up to the sternum.  A lighted retractor was utilized and dissection continued superiorly in order to create a pocket for the expander.  The base diameter was measured at 14 to 15 cm and it was felt that the patient would need approximately 600 mL.  An MX 600 mL 14 cm wide expander was chosen.  The air was removed.  A piece of acellular dermal matrix was then irrigated, washed, and prepared.  It was then placed in the field and pexed to the inferior mammary fold, which was recreated approximately 1 cm lower than previously.  It was pexed down using interrupted 0-PDS sutures in a mattress fashion.  Next, the  superior edge of the ADM was sutured to the edge of the pectoralis using 2-0 Vicryl and the pocket was then copiously irrigated and a 15-French Blake drain was brought out through a stab incision anteriorly into the superficial space and posteriorly into the deep space underneath the pectoralis.  These were secured into place.  Next, the expander was seated into the base of the wound inferiorly against the inframammary fold and then the closure was completed between pectoralis and ADM.  The expander was then expanded to 150 mL and she was inspected again for hemostasis.  The wound was irrigated.  Of course I did change my gloves prior to placing the expander.  Minimal touch of the expander and contact with the skin was preferred.  Finally the skin edges were reapproximated with 3-0 Vicryl in the scar layer followed by the deep dermis and a 4-0 subcuticular Monocryl completed the closure. Needle, sponge, and lap counts were correct and additional 50 mL was placed in the expander for a total of 300 mL.  The wounds were dressed with Dermabond, Steri-Strips, and sterile dressings.  She tolerated the procedure well.  There were no apparent complications.          ______________________________ Cleda Daub, MD     BSC/MEDQ  D:  01/18/2015  T:  01/19/2015  Job:  [id number]

## 2015-01-22 ENCOUNTER — Encounter: Payer: Self-pay | Admitting: Plastic Surgery

## 2015-01-22 LAB — SURGICAL PATHOLOGY

## 2015-01-23 ENCOUNTER — Encounter: Payer: Self-pay | Admitting: Plastic Surgery

## 2015-01-24 ENCOUNTER — Other Ambulatory Visit: Payer: 59

## 2015-01-24 ENCOUNTER — Ambulatory Visit: Payer: 59 | Admitting: Hematology and Oncology

## 2015-01-30 ENCOUNTER — Other Ambulatory Visit: Payer: Self-pay | Admitting: *Deleted

## 2015-01-31 NOTE — Patient Outreach (Addendum)
[facility]) Care Management  01/30/2015  [name] Aug 27, 1950 [id number]  RN spoke with pt today concerning her requested for a benefit exception. Pt verifies she has underwent her procedure and currently awaiting a response from [facility] concerning her covered benefits. Based upon the research of the facility (in network) and the physician or surgeon fees for an outpatient surgery there is a coverage of benefits. RN explained the process and the coinsurance based upon the billed tier.  RN informed the pt that the physician/surgeon fees are covered based upon the Tier 1 and 2 with a  coninsurance of 20% that's would be covered by the member. RN inquired on the bills received for this procedure performed earlier in this month however pt states the physician was awaiting approved via [facility] prior to billing. RN requested further information on the billing process and the  purpose for the delay. Pt proceeded to state the doctor does not except [facility] insurance. Based upon this information provided RN has encouraged pt to requested her provider to submit the billing under her insurance for possible coverage however if not this was not a benefit exception due to the providers option not to except her insurance. RN verified if she was aware of this information prior to proceeding with the planned surgery (pt verified she was aware). Pt indicated she would discussed this information with her provider and request office to bill her insurance. Pt has also indicated that she has been paying the speciality co-pays and received pre-op and post-op visits. Pt assumed this providers has billed her insurance for the difference but again will confirm and contact this provider's billing office to inquire further. As a Marketing executive offered to follow up in few weeks to inquired further on the progress. Will present closure on this case at that time as pt is aware she may have to work out a payment plan on the recent  surgery based upon her provider not excepting her insurance coverage. Pt with full understanding and grateful for the information provided today. No other inquires or request at this time.   Raina Mina, RN Care Management Coordinator [facility] Main Office [phone]

## 2015-02-15 HISTORY — PX: AUGMENTATION MAMMAPLASTY: SUR837

## 2015-02-21 ENCOUNTER — Other Ambulatory Visit: Payer: 59

## 2015-02-21 ENCOUNTER — Ambulatory Visit: Payer: 59 | Admitting: Hematology and Oncology

## 2015-02-28 ENCOUNTER — Inpatient Hospital Stay: Payer: 59 | Attending: Hematology and Oncology

## 2015-02-28 ENCOUNTER — Encounter: Payer: Self-pay | Admitting: Hematology and Oncology

## 2015-02-28 ENCOUNTER — Ambulatory Visit (HOSPITAL_BASED_OUTPATIENT_CLINIC_OR_DEPARTMENT_OTHER): Payer: 59 | Admitting: Hematology and Oncology

## 2015-02-28 VITALS — BP 142/71 | HR 90 | Temp 98.7°F | Resp 18 | Ht 66.0 in | Wt 163.6 lb

## 2015-02-28 DIAGNOSIS — Z8589 Personal history of malignant neoplasm of other organs and systems: Secondary | ICD-10-CM

## 2015-02-28 DIAGNOSIS — Z170 Estrogen receptor positive status [ER+]: Secondary | ICD-10-CM

## 2015-02-28 DIAGNOSIS — Z806 Family history of leukemia: Secondary | ICD-10-CM | POA: Insufficient documentation

## 2015-02-28 DIAGNOSIS — M2550 Pain in unspecified joint: Secondary | ICD-10-CM | POA: Diagnosis not present

## 2015-02-28 DIAGNOSIS — C50911 Malignant neoplasm of unspecified site of right female breast: Secondary | ICD-10-CM | POA: Insufficient documentation

## 2015-02-28 DIAGNOSIS — Z79811 Long term (current) use of aromatase inhibitors: Secondary | ICD-10-CM

## 2015-02-28 DIAGNOSIS — Z79899 Other long term (current) drug therapy: Secondary | ICD-10-CM | POA: Diagnosis not present

## 2015-02-28 DIAGNOSIS — Z803 Family history of malignant neoplasm of breast: Secondary | ICD-10-CM | POA: Insufficient documentation

## 2015-02-28 LAB — CBC WITH DIFFERENTIAL/PLATELET
Basophils Absolute: 0.1 10*3/uL (ref 0–0.1)
Basophils Relative: 1 %
Eosinophils Absolute: 0.3 10*3/uL (ref 0–0.7)
Eosinophils Relative: 4 %
HCT: 42.4 % (ref 35.0–47.0)
Hemoglobin: 14.2 g/dL (ref 12.0–16.0)
Lymphocytes Relative: 26 %
Lymphs Abs: 1.8 10*3/uL (ref 1.0–3.6)
MCH: 30.7 pg (ref 26.0–34.0)
MCHC: 33.6 g/dL (ref 32.0–36.0)
MCV: 91.3 fL (ref 80.0–100.0)
Monocytes Absolute: 0.9 10*3/uL (ref 0.2–0.9)
Monocytes Relative: 14 %
Neutro Abs: 3.7 10*3/uL (ref 1.4–6.5)
Neutrophils Relative %: 55 %
Platelets: 280 10*3/uL (ref 150–440)
RBC: 4.65 MIL/uL (ref 3.80–5.20)
RDW: 14 % (ref 11.5–14.5)
WBC: 6.8 10*3/uL (ref 3.6–11.0)

## 2015-02-28 LAB — COMPREHENSIVE METABOLIC PANEL
ALT: 15 U/L (ref 14–54)
AST: 22 U/L (ref 15–41)
Albumin: 4.3 g/dL (ref 3.5–5.0)
Alkaline Phosphatase: 68 U/L (ref 38–126)
Anion gap: 9 (ref 5–15)
BUN: 12 mg/dL (ref 6–20)
CO2: 24 mmol/L (ref 22–32)
Calcium: 9.5 mg/dL (ref 8.9–10.3)
Chloride: 104 mmol/L (ref 101–111)
Creatinine, Ser: 0.8 mg/dL (ref 0.44–1.00)
GFR calc Af Amer: >60 mL/min (ref >60)
GFR calc non Af Amer: >60 mL/min (ref >60)
Glucose, Bld: 113 mg/dL — ABNORMAL HIGH (ref 65–99)
Potassium: 4 mmol/L (ref 3.5–5.1)
Sodium: 137 mmol/L (ref 135–145)
Total Bilirubin: 0.8 mg/dL (ref 0.3–1.2)
Total Protein: 7.7 g/dL (ref 6.5–8.1)

## 2015-02-28 NOTE — Progress Notes (Signed)
Patient is here for follow-up of breast cancer. Patient states that she just had reconstructive breast surgery on November 3rd by Dr. Tula Nakayama. She states that everything went great, she is just having pain and soreness. Otherwise patient states that she has been doing well.

## 2015-02-28 NOTE — Progress Notes (Signed)
[facility] day:  02/28/2015   Chief Complaint: [name] is a 64 y.o. female with a history of stage IA right breast cancer who is seen for 5 month assessment.  HPI: The patient was last seen in the medical oncology clinic on 09/20/2014.  At that time, she denies any breast concerns.  Bone density study was normal.  She was tolerating her Aromasin with mild joint pain.  Exam was stable.  CA27.29 was 22.6 (normal).  Left sided mammogram on 11/06/2014 revealed no evidence of malignancy.  During the interim, she underwent right breast delayed reconstruction with tissue expander and acellular dermal matrix plus left breast reduction on 01/18/2015 by Dr. Nicholaus Bloom.  She states that she tolerated the procedure well.  Post-operatively she developed a left sided nipple infection.  She was off work for recovery.  She was off Aromasin for about 1 month.  Symptomatically, she notes a little residual soreness.    Past Medical History  Diagnosis Date  . Breast cancer ([place]) 02/2014  . Cancer ([place])     Vulvular  . PONV (postoperative nausea and vomiting)     pt states scopalamine patch helped and zofran and phenergan helped    Past Surgical History  Procedure Laterality Date  . Abdominal hysterectomy    . Total mastectomy Right 02/2013    stage 1 ER/PR+  . Vulvectomy    . Tissue expander placement Right 01/18/2015    Procedure: Right  BREAST EXPANDER PLACEMENT WITH ALLODERM;  Surgeon: Nicholaus Bloom, MD;  Location: [place];  Service: Plastics;  Laterality: Right;  DR COAN RM 2 PER BRENDA  . Breast reduction surgery Left 01/18/2015    Procedure: MAMMARY REDUCTION  (BREAST);  Surgeon: Nicholaus Bloom, MD;  Location: [place];  Service: Plastics;  Laterality: Left;    Family History  Problem Relation Age of Onset  . Hypertension Father   . Diabetes Father   . Stroke Father   . Diabetes Brother   . Hypertension Mother   . Thyroid  disease Mother   . Leukemia Maternal Grandmother   . Breast cancer Paternal Grandmother     Social History:  reports that she has never smoked. She has never used smokeless tobacco. She reports that she drinks about 1.8 oz of alcohol per week. She reports that she does not use illicit drugs.  The patient is alone today.  Allergies:  Allergies  Allergen Reactions  . Penicillins Rash  . Sulfa Antibiotics Rash    Current Medications: Current Outpatient Prescriptions  Medication Sig Dispense Refill  . Calcium Carbonate-Vitamin D (CALCIUM + D PO) Take by mouth.    Marland Kitchen exemestane (AROMASIN) 25 MG tablet Take 1 tablet (25 mg total) by mouth daily after breakfast. 90 tablet 0  . fexofenadine (ALLEGRA) 180 MG tablet Take 1 tablet by mouth daily.    Marland Kitchen glucosamine-chondroitin 500-400 MG tablet Take 1 tablet by mouth 2 (two) times daily.    . Magnesium 500 MG TABS Take by mouth.    . Melatonin 5 MG TABS Take 5 mg by mouth at bedtime as needed.    . MULTIPLE VITAMIN PO Take 1 tablet by mouth daily.    . Omega 3 1000 MG CAPS Take 1,000 mg by mouth 2 (two) times daily.    Marland Kitchen omeprazole (PRILOSEC) 20 MG capsule Take 1 capsule by mouth daily.    . Probiotic Product (PROBIOTIC DAILY PO) Take 10 mg by  mouth 2 (two) times daily.    Marland Kitchen triamcinolone (NASACORT ALLERGY 24HR) 55 MCG/ACT AERO nasal inhaler Place into the nose.    . Turmeric Curcumin 500 MG CAPS Take 2 tablets by mouth daily.      No current facility-administered medications for this visit.    Review of Systems:  GENERAL:  Feels "ok".  No fevers, sweats or weight loss. PERFORMANCE STATUS (ECOG):  1 HEENT:  Cracked crown.  No visual changes, runny nose, sore throat, mouth sores or tenderness. Lungs: No shortness of breath or cough.  No hemoptysis. Cardiac:  No chest pain, palpitations, orthopnea, or PND. GI:  No nausea, vomiting, diarrhea, constipation, melena or hematochezia. GU:  No urgency, frequency, dysuria, or  hematuria. Musculoskeletal:  Mild joint aches.  No muscle tenderness. Extremities:  No pain or swelling. Skin:  Interval left sided nipple infection.  No rashes or skin changes. Neuro:  No headache, numbness or weakness, balance or coordination issues. Endocrine:  No diabetes, thyroid issues, hot flashes or night sweats. Psych:  No mood changes, depression or anxiety. Pain:  No focal pain. Review of systems:  All other systems reviewed and found to be negative.  Physical Exam: Blood pressure 142/71, pulse 90, temperature 98.7 F (37.1 C), temperature source Tympanic, resp. rate 18, height _0  (1.676 m), weight 163 lb 9.3 oz (74.2 kg). GENERAL:  Well developed, well nourished, sitting comfortably in the exam room in no acute distress. MENTAL STATUS:  Alert and oriented to person, place and time. HEAD:  Short brown hair.  Normocephalic, atraumatic, face symmetric, no Cushingoid features. EYES:  Glasses.  Blue eyes.  Pupils equal round and reactive to light and accomodation.  No conjunctivitis or scleral icterus. ENT:  Oropharynx clear without lesion.  Tongue normal. Mucous membranes moist.  RESPIRATORY:  Clear to auscultation without rales, wheezes or rhonchi. CARDIOVASCULAR:  Regular rate and rhythm without murmur, rub or gallop. BREAST:  Right sided reconstruction with well healed incision.  No erythema or nodularity.  Left breast without masses, skin changes or nipple discharge. Small bandage on nipple. ABDOMEN:  Soft, non-tender, with active bowel sounds, and no hepatosplenomegaly.  No masses. SKIN:  No rashes, ulcers or lesions. EXTREMITIES: No edema, no skin discoloration or tenderness.  No palpable cords. LYMPH NODES: No palpable cervical, supraclavicular, axillary or inguinal adenopathy  NEUROLOGICAL: Unremarkable. PSYCH:  Appropriate.  Appointment on 02/28/2015  Component Date Value Ref Range Status  . WBC 02/28/2015 6.8  3.6 - 11.0 K/uL Final  . RBC 02/28/2015 4.65  3.80 -  5.20 MIL/uL Final  . Hemoglobin 02/28/2015 14.2  12.0 - 16.0 g/dL Final  . HCT 02/28/2015 42.4  35.0 - 47.0 % Final  . MCV 02/28/2015 91.3  80.0 - 100.0 fL Final  . MCH 02/28/2015 30.7  26.0 - 34.0 pg Final  . MCHC 02/28/2015 33.6  32.0 - 36.0 g/dL Final  . RDW 02/28/2015 14.0  11.5 - 14.5 % Final  . Platelets 02/28/2015 280  150 - 440 K/uL Final  . Neutrophils Relative % 02/28/2015 55   Final  . Neutro Abs 02/28/2015 3.7  1.4 - 6.5 K/uL Final  . Lymphocytes Relative 02/28/2015 26   Final  . Lymphs Abs 02/28/2015 1.8  1.0 - 3.6 K/uL Final  . Monocytes Relative 02/28/2015 14   Final  . Monocytes Absolute 02/28/2015 0.9  0.2 - 0.9 K/uL Final  . Eosinophils Relative 02/28/2015 4   Final  . Eosinophils Absolute 02/28/2015 0.3  0 - 0.7  K/uL Final  . Basophils Relative 02/28/2015 1   Final  . Basophils Absolute 02/28/2015 0.1  0 - 0.1 K/uL Final  . Sodium 02/28/2015 137  135 - 145 mmol/L Final  . Potassium 02/28/2015 4.0  3.5 - 5.1 mmol/L Final  . Chloride 02/28/2015 104  101 - 111 mmol/L Final  . CO2 02/28/2015 24  22 - 32 mmol/L Final  . Glucose, Bld 02/28/2015 113* 65 - 99 mg/dL Final  . BUN 02/28/2015 12  6 - 20 mg/dL Final  . Creatinine, Ser 02/28/2015 0.80  0.44 - 1.00 mg/dL Final  . Calcium 02/28/2015 9.5  8.9 - 10.3 mg/dL Final  . Total Protein 02/28/2015 7.7  6.5 - 8.1 g/dL Final  . Albumin 02/28/2015 4.3  3.5 - 5.0 g/dL Final  . AST 02/28/2015 22  15 - 41 U/L Final  . ALT 02/28/2015 15  14 - 54 U/L Final  . Alkaline Phosphatase 02/28/2015 68  38 - 126 U/L Final  . Total Bilirubin 02/28/2015 0.8  0.3 - 1.2 mg/dL Final  . GFR calc non Af Amer 02/28/2015 >60  >60 mL/min Final  . GFR calc Af Amer 02/28/2015 >60  >60 mL/min Final   Comment: (NOTE) The eGFR has been calculated using the CKD EPI equation. This calculation has not been validated in all clinical situations. eGFR's persistently <60 mL/min signify possible Chronic Kidney Disease.   . Anion gap 02/28/2015 9  5 - 15  Final    Assessment:  [name] is a 64 y.o. female with a history of stage IA right breast cancer status post mastectomy on 02/24/2013. 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 Sentinel lymph node was negative. Tumor was ER/PR positive and HER-2/neu negative.  She has been on aromatase inhibitors since 03/2013. She was initially on Femara than Arimidex, but discontinued secondary to hot flashes and joint pain. Currently she is tolerating Aromasin.  Left sided mammogram on 11/06/2014 revealed no evidence of malignancy.  CA27.29 was 22.6 (normal) on 09/20/2014.  Bone density study on 06/06/2014 revealed a T score of -0.9 in the L1-L4 region and -0.7 in the right femoral neck (normal).  She underwent right sided breast reconstruction and left sided breast reduction on 01/18/2015.  She was off Aromasin for 1 month.  She had a history of an abnormal PAP smear and is status post hysterectomy. 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 BRCA 1 and 2 testing was negative.   Symptomatically, she is doing well.  Exam reveals post-operative changes.  Plan: 1. Labs today:  CBC with diff, CMP, CA27.29. 2. Continue Aromasin. 3. Review left sided mammogram. 4. Discuss calcium and vitamin D. 5. RTC in 6 months for MD assess and labs (CBC, CMP, CA27.29).   Lequita Asal, MD  02/28/2015, 1:35 PM

## 2015-02-28 NOTE — Progress Notes (Signed)
[facility] day:  09/20/2014  Chief Complaint: [name] is a 64 y.o. female with a history of stage IA right breast cancer who is seen for 4 month assessment.  HPI: The patient was last seen in the medical oncology clinic on 05/24/2014.  At that time, she noted some joint aches on Aromasin.  She was considering a left-sided mastectomy versus right-sided reconstruction. Exam was unremarkable. CA-27-29 was 23.9 (normal).  She underwent bone density study on 06/06/2014. T score was -0.9 in the L1-L4 region and -0.7 in the right femoral neck (normal).  Symptomatically, she notes that her feet hurt all the time.  She denies any breast concerns.  Past Medical History  Diagnosis Date  . Breast cancer ([place]) 02/2014  . Cancer ([place])     Vulvular  . PONV (postoperative nausea and vomiting)     pt states scopalamine patch helped and zofran and phenergan helped    Past Surgical History  Procedure Laterality Date  . Abdominal hysterectomy    . Total mastectomy Right 02/2013    stage 1 ER/PR+  . Vulvectomy    . Tissue expander placement Right 01/18/2015    Procedure: Right  BREAST EXPANDER PLACEMENT WITH ALLODERM;  Surgeon: Nicholaus Bloom, MD;  Location: [employer];  Service: Plastics;  Laterality: Right;  DR COAN RM 2 PER BRENDA  . Breast reduction surgery Left 01/18/2015    Procedure: MAMMARY REDUCTION  (BREAST);  Surgeon: Nicholaus Bloom, MD;  Location: [place];  Service: Plastics;  Laterality: Left;    Family History  Problem Relation Age of Onset  . Hypertension Father   . Diabetes Father   . Stroke Father   . Diabetes Brother   . Hypertension Mother   . Thyroid disease Mother   . Leukemia Maternal Grandmother   . Breast cancer Paternal Grandmother     Social History:  reports that she has never smoked. She has never used smokeless tobacco. She reports that she drinks about 1.8 oz of alcohol per week. She reports that she  does not use illicit drugs.  The patient is alone today.  Allergies:  Allergies  Allergen Reactions  . Penicillins Rash  . Sulfa Antibiotics Rash    Current Medications: Current Outpatient Prescriptions  Medication Sig Dispense Refill  . Calcium Carbonate-Vitamin D (CALCIUM + D PO) Take by mouth.    Marland Kitchen glucosamine-chondroitin 500-400 MG tablet Take 1 tablet by mouth 2 (two) times daily.    . Magnesium 500 MG TABS Take by mouth.    . Melatonin 5 MG TABS Take 5 mg by mouth at bedtime as needed.    . Omega 3 1000 MG CAPS Take 1,000 mg by mouth 2 (two) times daily.    . Probiotic Product (PROBIOTIC DAILY PO) Take 10 mg by mouth 2 (two) times daily.    Marland Kitchen exemestane (AROMASIN) 25 MG tablet Take 1 tablet (25 mg total) by mouth daily after breakfast. 90 tablet 0  . fexofenadine (ALLEGRA) 180 MG tablet Take 1 tablet by mouth daily.    . MULTIPLE VITAMIN PO Take 1 tablet by mouth daily.    Marland Kitchen omeprazole (PRILOSEC) 20 MG capsule Take 1 capsule by mouth daily.    Marland Kitchen triamcinolone (NASACORT ALLERGY 24HR) 55 MCG/ACT AERO nasal inhaler Place into the nose.    . Turmeric Curcumin 500 MG CAPS Take 2 tablets by mouth daily.      No current facility-administered medications for this  visit.    Review of Systems:  GENERAL:  Feels good.  Active.  No fevers, sweats or weight loss. PERFORMANCE STATUS (ECOG):  0 HEENT:  No visual changes, runny nose, sore throat, mouth sores or tenderness. Lungs: No shortness of breath or cough.  No hemoptysis. Cardiac:  No chest pain, palpitations, orthopnea, or PND. GI:  No nausea, vomiting, diarrhea, constipation, melena or hematochezia. GU:  No urgency, frequency, dysuria, or hematuria. Musculoskeletal:  Feet hurt all of the time.  Mild joint aches.  No muscle tenderness. Extremities:  No pain or swelling. Skin:  No rashes or skin changes. Neuro:  No headache, numbness or weakness, balance or coordination issues. Endocrine:  No diabetes, thyroid issues, hot flashes  or night sweats. Psych:  No mood changes, depression or anxiety. Pain:  No focal pain. Review of systems:  All other systems reviewed and found to be negative.  Physical Exam: Blood pressure 143/73, pulse 79, temperature 97.8 F (36.6 C), temperature source Oral, resp. rate 17, height 5' 5.5" (1.664 m), weight 165 lb 7.3 oz (75.05 kg). GENERAL:  Well developed, well nourished, sitting comfortably in the exam room in no acute distress. MENTAL STATUS:  Alert and oriented to person, place and time. HEAD:  Shoulder length brown hair.  Normocephalic, atraumatic, face symmetric, no Cushingoid features. EYES:  Blue eyes.  Pupils equal round and reactive to light and accomodation.  No conjunctivitis or scleral icterus. ENT:  Oropharynx clear without lesion.  Tongue normal. Mucous membranes moist.  RESPIRATORY:  Clear to auscultation without rales, wheezes or rhonchi. CARDIOVASCULAR:  Regular rate and rhythm without murmur, rub or gallop. BREAST:  Right sided mastectomy without erythema or nodularity.  Left breast without masses, skin changes or nipple discharge.  ABDOMEN:  Soft, non-tender, with active bowel sounds, and no hepatosplenomegaly.  No masses. SKIN:  No rashes, ulcers or lesions. EXTREMITIES: No edema, no skin discoloration or tenderness.  No palpable cords. LYMPH NODES: No palpable cervical, supraclavicular, axillary or inguinal adenopathy  NEUROLOGICAL: Unremarkable. PSYCH:  Appropriate.  Appointment on 09/20/2014  Component Date Value Ref Range Status  . WBC 09/20/2014 6.3  3.6 - 11.0 K/uL Final  . RBC 09/20/2014 4.63  3.80 - 5.20 MIL/uL Final  . Hemoglobin 09/20/2014 14.5  12.0 - 16.0 g/dL Final  . HCT 09/20/2014 42.4  35.0 - 47.0 % Final  . MCV 09/20/2014 91.4  80.0 - 100.0 fL Final  . MCH 09/20/2014 31.2  26.0 - 34.0 pg Final  . MCHC 09/20/2014 34.2  32.0 - 36.0 g/dL Final  . RDW 09/20/2014 13.9  11.5 - 14.5 % Final  . Platelets 09/20/2014 277  150 - 440 K/uL Final  .  Neutrophils Relative % 09/20/2014 53   Final  . Neutro Abs 09/20/2014 3.4  1.4 - 6.5 K/uL Final  . Lymphocytes Relative 09/20/2014 26   Final  . Lymphs Abs 09/20/2014 1.6  1.0 - 3.6 K/uL Final  . Monocytes Relative 09/20/2014 15   Final  . Monocytes Absolute 09/20/2014 0.9  0.2 - 0.9 K/uL Final  . Eosinophils Relative 09/20/2014 5   Final  . Eosinophils Absolute 09/20/2014 0.3  0 - 0.7 K/uL Final  . Basophils Relative 09/20/2014 1   Final  . Basophils Absolute 09/20/2014 0.1  0 - 0.1 K/uL Final  . Sodium 09/20/2014 138  135 - 145 mmol/L Final  . Potassium 09/20/2014 4.0  3.5 - 5.1 mmol/L Final  . Chloride 09/20/2014 103  101 - 111 mmol/L Final  .  CO2 09/20/2014 25  22 - 32 mmol/L Final  . Glucose, Bld 09/20/2014 110* 65 - 99 mg/dL Final  . BUN 09/20/2014 16  6 - 20 mg/dL Final  . Creatinine, Ser 09/20/2014 0.85  0.44 - 1.00 mg/dL Final  . Calcium 09/20/2014 9.7  8.9 - 10.3 mg/dL Final  . Total Protein 09/20/2014 7.4  6.5 - 8.1 g/dL Final  . Albumin 09/20/2014 4.1  3.5 - 5.0 g/dL Final  . AST 09/20/2014 22  15 - 41 U/L Final  . ALT 09/20/2014 18  14 - 54 U/L Final  . Alkaline Phosphatase 09/20/2014 65  38 - 126 U/L Final  . Total Bilirubin 09/20/2014 0.4  0.3 - 1.2 mg/dL Final  . GFR calc non Af Amer 09/20/2014 >60  >60 mL/min Final  . GFR calc Af Amer 09/20/2014 >60  >60 mL/min Final   Comment: (NOTE) The eGFR has been calculated using the CKD EPI equation. This calculation has not been validated in all clinical situations. eGFR's persistently <60 mL/min signify possible Chronic Kidney Disease.   . Anion gap 09/20/2014 10  5 - 15 Final  . CA 27.29 09/20/2014 22.6  0.0 - 38.6 U/mL Final   Comment: (NOTE) [employer]/ACS methodology Performed At: [facility] [address] [place], [place] [id number] Lindon Romp MD FY:[id number]     Assessment:  [name] is a 64 y.o. female with a history of stage IA right breast cancer status post mastectomy on  02/24/2013. 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 Sentinel lymph node was negative. Tumor was ER/PR positive and HER-2/neu negative.  She has been on aromatase inhibitors since 03/2013. She was initially on Femara than Arimidex, but discontinued secondary to hot flashes and joint pain. Currently she is tolerating Aromasin.  Mammogram on 10/17/2013 was benign. CA27.29 was 23.9 on 05/24/2014.  Bone density study on 06/06/2014 revealed a T score of -0.9 in the L1-L4 region and -0.7 in the right femoral neck (normal).  She had a history of an abnormal PAP smear and is status post hysterectomy. 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 BRCA 1 and 2 testing was negative.   Symptomatically, she notes mild joint pain.  Exam is stable.  Plan: 1. Labs today:  CBC with diff, CMP, CA27.29. 2. Continue Aromasin. 3. Schedule left sided mammogram 10/18/2014. 4. Review interval bone density study. 5. RTC in 4 months for MD assess, labs (CBC, CMP, CA27.29), and review of mammogram.   Lequita Asal, MD  09/20/2014

## 2015-03-01 ENCOUNTER — Other Ambulatory Visit: Payer: Self-pay

## 2015-03-01 DIAGNOSIS — C50911 Malignant neoplasm of unspecified site of right female breast: Secondary | ICD-10-CM

## 2015-03-01 LAB — CANCER ANTIGEN 27.29: CA 27.29: 25.3 U/mL (ref 0.0–38.6)

## 2015-03-06 NOTE — Discharge Instructions (Signed)

## 2015-03-13 ENCOUNTER — Ambulatory Visit: Payer: 59 | Admitting: Certified Registered"

## 2015-03-13 ENCOUNTER — Encounter: Payer: Self-pay | Admitting: *Deleted

## 2015-03-13 ENCOUNTER — Ambulatory Visit
Admission: RE | Admit: 2015-03-13 | Discharge: 2015-03-13 | Disposition: A | Discharge status: Home / Self Care | Payer: 59 | Source: Ambulatory Visit | Attending: Plastic Surgery | Admitting: Plastic Surgery

## 2015-03-13 ENCOUNTER — Encounter
Admission: RE | Disposition: A | Discharge status: Home / Self Care | Payer: Self-pay | Source: Ambulatory Visit | Attending: Plastic Surgery

## 2015-03-13 DIAGNOSIS — K219 Gastro-esophageal reflux disease without esophagitis: Secondary | ICD-10-CM | POA: Insufficient documentation

## 2015-03-13 DIAGNOSIS — M1990 Unspecified osteoarthritis, unspecified site: Secondary | ICD-10-CM | POA: Insufficient documentation

## 2015-03-13 DIAGNOSIS — Z90710 Acquired absence of both cervix and uterus: Secondary | ICD-10-CM | POA: Insufficient documentation

## 2015-03-13 DIAGNOSIS — C50911 Malignant neoplasm of unspecified site of right female breast: Secondary | ICD-10-CM | POA: Insufficient documentation

## 2015-03-13 DIAGNOSIS — Z880 Allergy status to penicillin: Secondary | ICD-10-CM | POA: Insufficient documentation

## 2015-03-13 DIAGNOSIS — G588 Other specified mononeuropathies: Secondary | ICD-10-CM | POA: Diagnosis not present

## 2015-03-13 DIAGNOSIS — Z8619 Personal history of other infectious and parasitic diseases: Secondary | ICD-10-CM | POA: Diagnosis not present

## 2015-03-13 DIAGNOSIS — T85848A Pain due to other internal prosthetic devices, implants and grafts, initial encounter: Secondary | ICD-10-CM | POA: Insufficient documentation

## 2015-03-13 DIAGNOSIS — Z803 Family history of malignant neoplasm of breast: Secondary | ICD-10-CM | POA: Diagnosis not present

## 2015-03-13 DIAGNOSIS — Z882 Allergy status to sulfonamides status: Secondary | ICD-10-CM | POA: Insufficient documentation

## 2015-03-13 DIAGNOSIS — E785 Hyperlipidemia, unspecified: Secondary | ICD-10-CM | POA: Insufficient documentation

## 2015-03-13 DIAGNOSIS — Z8544 Personal history of malignant neoplasm of other female genital organs: Secondary | ICD-10-CM | POA: Insufficient documentation

## 2015-03-13 DIAGNOSIS — Z9011 Acquired absence of right breast and nipple: Secondary | ICD-10-CM | POA: Diagnosis not present

## 2015-03-13 DIAGNOSIS — Z79899 Other long term (current) drug therapy: Secondary | ICD-10-CM | POA: Diagnosis not present

## 2015-03-13 DIAGNOSIS — Y834 Other reconstructive surgery as the cause of abnormal reaction of the patient, or of later complication, without mention of misadventure at the time of the procedure: Secondary | ICD-10-CM | POA: Insufficient documentation

## 2015-03-13 HISTORY — DX: Unspecified osteoarthritis, unspecified site: M19.90

## 2015-03-13 HISTORY — PX: TISSUE EXPANDER PLACEMENT: SHX2530

## 2015-03-13 HISTORY — DX: Inflammatory liver disease, unspecified: K75.9

## 2015-03-13 SURGERY — INSERTION, TISSUE EXPANDER
Anesthesia: General | Laterality: Right | Wound class: Clean

## 2015-03-13 MED ORDER — SODIUM CHLORIDE 0.9 % IR SOLN
Status: DC | PRN
  Administered 2015-03-13: 500 mL

## 2015-03-13 MED ORDER — ONDANSETRON HCL 4 MG/2ML IJ SOLN
INTRAMUSCULAR | Status: DC | PRN
  Administered 2015-03-13: 4 mg via INTRAVENOUS

## 2015-03-13 MED ORDER — SODIUM CHLORIDE 0.9 % IV SOLN
1000.0000 mg | Freq: Once | INTRAVENOUS | Status: AC
  Administered 2015-03-13: 1000 mg via INTRAVENOUS

## 2015-03-13 MED ORDER — LACTATED RINGERS IV SOLN
INTRAVENOUS | Status: DC
  Administered 2015-03-13: 11:00:00 via INTRAVENOUS

## 2015-03-13 MED ORDER — MIDAZOLAM HCL 5 MG/5ML IJ SOLN
INTRAMUSCULAR | Status: DC | PRN
  Administered 2015-03-13: 2 mg via INTRAVENOUS

## 2015-03-13 MED ORDER — FENTANYL CITRATE (PF) 100 MCG/2ML IJ SOLN
INTRAMUSCULAR | Status: DC | PRN
  Administered 2015-03-13 (×2): 25 ug via INTRAVENOUS
  Administered 2015-03-13: 50 ug via INTRAVENOUS

## 2015-03-13 MED ORDER — DEXAMETHASONE SODIUM PHOSPHATE 4 MG/ML IJ SOLN
INTRAMUSCULAR | Status: DC | PRN
  Administered 2015-03-13: 4 mg via INTRAVENOUS

## 2015-03-13 MED ORDER — LIDOCAINE HCL (CARDIAC) 20 MG/ML IV SOLN
INTRAVENOUS | Status: DC | PRN
  Administered 2015-03-13: 40 mg via INTRATRACHEAL

## 2015-03-13 MED ORDER — SCOPOLAMINE 1 MG/3DAYS TD PT72
1.0000 | MEDICATED_PATCH | Freq: Once | TRANSDERMAL | Status: DC
  Administered 2015-03-13: 1.5 mg via TRANSDERMAL

## 2015-03-13 MED ORDER — DIPHENHYDRAMINE HCL 50 MG/ML IJ SOLN
INTRAMUSCULAR | Status: DC | PRN
  Administered 2015-03-13: 25 mg via INTRAVENOUS

## 2015-03-13 MED ORDER — OXYCODONE HCL 5 MG PO TABS
5.0000 mg | ORAL_TABLET | Freq: Once | ORAL | Status: AC | PRN
  Administered 2015-03-13: 5 mg via ORAL

## 2015-03-13 MED ORDER — OXYCODONE HCL 5 MG/5ML PO SOLN: 5.0000 mg | Freq: Once | ORAL | Status: AC | PRN

## 2015-03-13 MED ORDER — ONDANSETRON HCL 4 MG/2ML IJ SOLN
4.0000 mg | Freq: Once | INTRAMUSCULAR | Status: DC | PRN
Start: 2015-03-13 — End: 2015-03-13

## 2015-03-13 MED ORDER — GLYCOPYRROLATE 0.2 MG/ML IJ SOLN
INTRAMUSCULAR | Status: DC | PRN
  Administered 2015-03-13: .1 mg via INTRAVENOUS

## 2015-03-13 MED ORDER — FENTANYL CITRATE (PF) 100 MCG/2ML IJ SOLN: 25.0000 ug | INTRAMUSCULAR | Status: DC | PRN

## 2015-03-13 MED ORDER — PROPOFOL 10 MG/ML IV BOLUS
INTRAVENOUS | Status: DC | PRN
  Administered 2015-03-13: 130 mg via INTRAVENOUS

## 2015-03-13 MED ORDER — METOCLOPRAMIDE HCL 5 MG/ML IJ SOLN
INTRAMUSCULAR | Status: DC | PRN
  Administered 2015-03-13: 10 mg via INTRAVENOUS

## 2015-03-13 MED ORDER — SODIUM CHLORIDE 0.9 % IJ SOLN
INTRAMUSCULAR | Status: DC | PRN
  Administered 2015-03-13: 50 mL via INTRAMUSCULAR

## 2015-03-13 SURGICAL SUPPLY — 66 items
BACTOSHIELD CHG 4% 4OZ (MISCELLANEOUS) ×6
BASIN GRAD PLASTIC 32OZ STRL (MISCELLANEOUS) ×3 IMPLANT
BLADE BOVIE TIP EXT 4 (BLADE) ×3 IMPLANT
BLADE SURG 15 STRL LF DISP TIS (BLADE) ×1 IMPLANT
BLADE SURG 15 STRL SS (BLADE) ×2
BLADE SURG SZ10 CARB STEEL (BLADE) ×3 IMPLANT
BNDG GAUZE 4.5X4.1 6PLY STRL (MISCELLANEOUS) ×6 IMPLANT
CANISTER SUCT 1200ML W/VALVE (MISCELLANEOUS) ×3 IMPLANT
CLOSURE WOUND 1/2 X4 (GAUZE/BANDAGES/DRESSINGS) ×1
CNTNR SPEC 2.5X3XGRAD LEK (MISCELLANEOUS) ×1
CONT SPEC 4OZ STER OR WHT (MISCELLANEOUS) ×2
CONTAINER SPEC 2.5X3XGRAD LEK (MISCELLANEOUS) ×1 IMPLANT
COVER LIGHT HANDLE UNIVERSAL (MISCELLANEOUS) ×6 IMPLANT
DRAIN BLAKE 15F W/TROCAR (DRAIN) IMPLANT
DRAPE CHEST BREAST 15X10 FENES (DRAPES) ×3 IMPLANT
DRAPE SHEET LG 3/4 BI-LAMINATE (DRAPES) ×3 IMPLANT
DRSG TEGADERM 4X4.75 (GAUZE/BANDAGES/DRESSINGS) ×6 IMPLANT
ELECT CAUTERY BLADE TIP 2.5 (TIP) ×3
ELECTRODE CAUTERY BLDE TIP 2.5 (TIP) ×1 IMPLANT
GAUZE SPONGE 4X4 12PLY STRL (GAUZE/BANDAGES/DRESSINGS) ×3 IMPLANT
GLOVE BIO SURGEON STRL SZ7.5 (GLOVE) ×6 IMPLANT
GOWN STRL REUS W/ TWL LRG LVL3 (GOWN DISPOSABLE) ×2 IMPLANT
GOWN STRL REUS W/TWL LRG LVL3 (GOWN DISPOSABLE) ×4
IMPL BREAST INSPIRA 445CC (Breast) ×1 IMPLANT
IMPLANT BREAST INSPIRA 445CC (Breast) ×2 IMPLANT
IV NS 1000ML (IV SOLUTION) ×2
IV NS 1000ML BAXH (IV SOLUTION) ×1 IMPLANT
IV SET PRIMARY 60D N/DEHP TUR (IV SETS) IMPLANT
KIT ROOM TURNOVER OR (KITS) ×3 IMPLANT
LIQUID BAND (GAUZE/BANDAGES/DRESSINGS) ×3 IMPLANT
MARKER SKIN SURG W/RULER VIO (MISCELLANEOUS) ×6 IMPLANT
NATRELLE INSPIRA RE-STERILIZABLE SIZER ×3 IMPLANT
NDL HYPO TW 22X1.5 (NEEDLE) ×3 IMPLANT
NEEDLE HYPO 18GX1.5 BLUNT FILL (NEEDLE) ×6 IMPLANT
NS IRRIG 500ML POUR BTL (IV SOLUTION) ×3 IMPLANT
PACK BASIN MINOR ARMC (MISCELLANEOUS) ×3 IMPLANT
SCRUB CHG 4% DYNA-HEX 4OZ (MISCELLANEOUS) ×3 IMPLANT
SPONGE LAP 18X18 5 PK (GAUZE/BANDAGES/DRESSINGS) ×6 IMPLANT
STAPLER SKIN PROX 35W (STAPLE) ×3 IMPLANT
STOPCOCK 4WAY DISCOFIX (IV SETS) IMPLANT
STRAP BODY AND KNEE 60X3 (MISCELLANEOUS) ×3 IMPLANT
STRIP CLOSURE SKIN 1/2X4 (GAUZE/BANDAGES/DRESSINGS) ×2 IMPLANT
SUCT RESERVOIR 100CC (MISCELLANEOUS) IMPLANT
SUT 0 PDO 45CMX45XM (SUTURE) ×3 IMPLANT
SUT GTX CV3 36 (SUTURE) IMPLANT
SUT MNCRL 4-0 (SUTURE) ×2
SUT MNCRL 4-0 27XMFL (SUTURE) ×1
SUT MON AB 3-0 SH 27 (SUTURE) ×3 IMPLANT
SUT PDS 2-0 27IN (SUTURE) IMPLANT
SUT PDS 3 0 CT 1 27 (SUTURE) ×3 IMPLANT
SUT PDSII 0 (SUTURE) IMPLANT
SUT SILK 2 0 SH (SUTURE) ×3 IMPLANT
SUT VIC AB 3-0 SH 27 (SUTURE)
SUT VIC AB 3-0 SH 27X BRD (SUTURE) IMPLANT
SUT VICRYL 3-0 CR8 SH (SUTURE) ×3 IMPLANT
SUTURE MNCRL 4-0 27XMF (SUTURE) ×1 IMPLANT
SUTURE PDS 2-0 II CT-1 (SUTURE) ×3 IMPLANT
SYR 20CC LL (SYRINGE) ×3 IMPLANT
SYR 3ML LL SCALE MARK (SYRINGE) ×3 IMPLANT
SYR 50ML LL SCALE MARK (SYRINGE) IMPLANT
SYR BULB IRRIG 60ML STRL (SYRINGE) ×3 IMPLANT
SYR CONTROL 10ML LL (SYRINGE) IMPLANT
SYRINGE 10CC LL (SYRINGE) ×3 IMPLANT
TOWEL OR 17X26 4PK STRL BLUE (TOWEL DISPOSABLE) ×3 IMPLANT
UNDERPAD 30X60 958B10 (PK) (MISCELLANEOUS) ×6 IMPLANT
WATER STERILE IRR 500ML POUR (IV SOLUTION) IMPLANT

## 2015-03-13 NOTE — Anesthesia Preprocedure Evaluation (Signed)
Anesthesia Evaluation  Patient identified by MRN, date of birth, ID band  Reviewed: Allergy & Precautions, H&P , NPO status , Patient's Chart, lab work & pertinent test results  History of Anesthesia Complications (+) PONV and history of anesthetic complications  Airway Mallampati: II  TM Distance: >3 FB Neck ROM: full    Dental no notable dental hx.    Pulmonary    Pulmonary exam normal        Cardiovascular  Rhythm:regular Rate:Normal     Neuro/Psych    GI/Hepatic GERD  ,  Endo/Other    Renal/GU      Musculoskeletal   Abdominal   Peds  Hematology   Anesthesia Other Findings   Reproductive/Obstetrics                             Anesthesia Physical Anesthesia Plan  ASA: II  Anesthesia Plan: General LMA   Post-op Pain Management:    Induction:   Airway Management Planned:   Additional Equipment:   Intra-op Plan:   Post-operative Plan:   Informed Consent: I have reviewed the patients History and Physical, chart, labs and discussed the procedure including the risks, benefits and alternatives for the proposed anesthesia with the patient or authorized representative who has indicated his/her understanding and acceptance.     Plan Discussed with: CRNA  Anesthesia Plan Comments:         Anesthesia Quick Evaluation

## 2015-03-13 NOTE — Op Note (Unsigned)
[name], [name]                ACCOUNT NO.:  [id number]  MEDICAL RECORD NO.:  [id number]  LOCATION:  [facility]                        FACILITY:  [facility]  PHYSICIAN:  Cleda Daub, MD       DATE OF BIRTH:  31-May-1950  DATE OF PROCEDURE:  03/13/2015 DATE OF DISCHARGE:  03/13/2015                              OPERATIVE REPORT   PREOPERATIVE DIAGNOSIS:  Previous right breast cancer status post first stage breast reconstruction with expander and nerve pain.  POSTOPERATIVE DIAGNOSIS:  Previous right breast cancer status post first stage breast reconstruction with expander and nerve pain.  PROCEDURE PERFORMED:  Right expander removal, extensive capsulotomy, lateral capsulorraphy, and nerve ablation.  SURGEON:  Cleda Daub, MD  STAFF SURGEON:  Cleda Daub, MD  ANESTHESIA:  General endotracheal.  EBL:  Less than 10 mL.  COMPLICATIONS:  None.  FINDINGS:  Good symmetry and implant position at completion of procedure.  DISPOSITION:  Stable to recovery.  STATEMENT OF NECESSITY:  The patient is a pleasant 64 year old female with a history of right breast cancer.  She had successfully undergone mastectomy and first stage breast reconstruction with expander.  She has obtained a size that is agreeable to her that fairly closely matches her left side and she does not want to be significantly larger on the right. We have discussed removal of the expander and placement of a formal silicone implant.  She is also complaining of nerve pain from the lateral thoracic nerve distribution and request the exploration.  The risks, benefits, and alternatives were reviewed with her and she has requested the procedure as described.  STATEMENT OF PROCEDURE:  The patient was brought to the operating room, awake, alert, and comfortable and placed on the OR table in the supine position with both arms outstretched.  After anesthesia was introduced, the peri-breast region was infiltrated with a dilute  mixture of lidocaine and Marcaine.  This was allowed to take effect during which time, she was prepped and draped in the usual sterile fashion.  The previous mastectomy incision was opened laterally for approximately 6 cm and carried down through the skin and subcutaneous tissues.  At the deep layer where the capsule was identified, the capsule was divided superiorly approximately 1 cm above the incision creating a sterile step.  The expander was found to be densely adherent to the entire capsule except for the lower pole where the ADM was fully incorporated. No fluid was found in the pocket.  The expander was bluntly dissected away from the capsule and removed without difficulty.  The pocket was examined and there was no evidence of complication or problem.  The lateral region was exposed along the edge of pectoralis major and the lateral intercostal nerve was identified and cauterized.  Next, the lateral breast pocket was advanced over pectoralis major using a 2-armed 0 PDS suture creating a capsulorraphy at the lateral chest wall.  This was performed in several layers to reinforce in buttress the lateral wall.  At each stage, a sizer was placed in the pocket and examined for position.  Ultimately, a 445 and then 485 mL sizer were examined, but the smaller size was chosen.  The wound was copiously irrigated.  The superior and medial capsule was opened as well creating a slightly larger pocket.  Hemostasis was confirmed.  Triple antibiotic irrigation was utilized and then the capsule was sutured with temporary 3-0 Vicryl and re-prepped with Betadine and I changed my gloves.  The formal 445 mL implant was placed in the pocket and position was confirmed.  The deep layer was closed in layers with 3-0 Vicryl and then deep dermal 3-0 Vicryl and a 4-0 Monocryl completed the closure.  Needle, sponge, and lap counts were correct.  She tolerated the procedure well and was placed in a compressive  garment, awakened smoothly from anesthesia and transferred to recovery in good condition.          ______________________________ Cleda Daub, MD     BSC/MEDQ  D:  03/13/2015  T:  03/13/2015  Job:  [id number]

## 2015-03-13 NOTE — Transfer of Care (Signed)
Immediate Anesthesia Transfer of Care Note  Patient: [name]  Procedure(s) Performed: Procedure(s): Right breast tissue expander removal and Sillicone implant placement. (Right)  Patient Location: PACU  Anesthesia Type: General LMA  Level of Consciousness: awake, alert  and patient cooperative  Airway and Oxygen Therapy: Patient Spontanous Breathing and Patient connected to supplemental oxygen  Post-op Assessment: Post-op Vital signs reviewed, Patient's Cardiovascular Status Stable, Respiratory Function Stable, Patent Airway and No signs of Nausea or vomiting  Post-op Vital Signs: Reviewed and stable  Complications: No apparent anesthesia complications

## 2015-03-13 NOTE — Anesthesia Procedure Notes (Signed)
Procedure Name: LMA Insertion Date/Time: 03/13/2015 11:11 AM Performed by: Mayme Genta Pre-anesthesia Checklist: Patient identified, Emergency Drugs available, Suction available, Timeout performed and Patient being monitored Patient Re-evaluated:Patient Re-evaluated prior to inductionOxygen Delivery Method: Circle system utilized Preoxygenation: Pre-oxygenation with 100% oxygen Intubation Type: IV induction LMA: LMA inserted LMA Size: 3.0 Number of attempts: 1 Placement Confirmation: positive ETCO2 and breath sounds checked- equal and bilateral Tube secured with: Tape

## 2015-03-13 NOTE — Anesthesia Postprocedure Evaluation (Signed)
Anesthesia Post Note  Patient: GENASIS BARSZCZ  Procedure(s) Performed: Procedure(s) (LRB): Right breast tissue expander removal and Sillicone implant placement. (Right)  Patient location during evaluation: PACU Anesthesia Type: General Level of consciousness: awake and alert and oriented Pain management: satisfactory to patient Vital Signs Assessment: post-procedure vital signs reviewed and stable Respiratory status: spontaneous breathing, nonlabored ventilation and respiratory function stable Cardiovascular status: blood pressure returned to baseline and stable Postop Assessment: Adequate PO intake and No signs of nausea or vomiting Anesthetic complications: no    Raliegh Ip

## 2015-03-13 NOTE — H&P (Signed)
  The current H&P has been reviewed and updated. It will be scanned in at a later date.  

## 2015-03-15 ENCOUNTER — Encounter: Payer: Self-pay | Admitting: Plastic Surgery

## 2015-03-19 NOTE — Op Note (Signed)
[name], [name]                ACCOUNT NO.:  [id number]  MEDICAL RECORD NO.:  [id number]  LOCATION:  [facility]                        FACILITY:  [facility]  PHYSICIAN:  Cleda Daub, MD       DATE OF BIRTH:  11/23/1950  DATE OF PROCEDURE:  03/13/2015 DATE OF DISCHARGE:  03/13/2015                              OPERATIVE REPORT   PREOPERATIVE DIAGNOSIS:  Previous right breast cancer status post first stage expander placement.  POSTOPERATIVE DIAGNOSIS:  Previous right breast cancer status post first stage expander placement.  PROCEDURE PERFORMED:  Right tissue expander removal and replacement; lateral capsulorraphy; superior and medial capsulotomies.  SURGEON:  Cleda Daub, MD  STAFF SURGEON:  Cleda Daub, MD  ANESTHESIA:  General endotracheal plus local infiltration.  EBL:  Less than 20 mL.  COMPLICATIONS:  None.  FINDINGS:  Good implant position and size symmetry at completion of procedure.  DISPOSITION:  Stable to recovery.  STATEMENT OF NECESSITY:  The patient is a pleasant 65 year old female. She has completed her first stage breast reconstruction with a tissue expander.  She has also already had a reduction on the left side and wants to be closer in size to the left, but did not want to be as large. She currently feels that she has a close approximation with a 375 mL saline fill.  We discussed sizing, the risks, the benefits, and the alternatives to the procedure and she has selected to proceed with implant exchange.  The risks of the ASPS consent form was reviewed and she has requested the procedure as described.  STATEMENT OF PROCEDURE:  The patient was brought to the operating room, awake, alert, and comfortable and after endotracheal anesthesia was introduced, both arms were outstretched, bony prominences were padded. The right was infiltrated with a dilute mixture of lidocaine and Marcaine and she was prepped and draped in usual sterile fashion.  The lateral  breast incision was reopened for approximately 6 cm and the dissection continued down to the capsule and then in a stair-step fashion, the capsule was opened approximately 1 cm superior to the incision.  Capsule was opened and the tissue was dissected away from the expander without difficulty and the expander was 100% incorporated. There was no free fluid and no blood.  The expander was removed and the pocket was examined.  The lateral capsulorraphy was performed and the lateral nerve that had been bothering the patient was identified and cauterized.  An additional stitch that was found in the lateral region near the T5 nerve was identified and removed.  The lateral buttress was then performed using a 0 Quill PDO suture beginning superiorly and ending along the inferior lateral portion of the breast.  A multilayered raphe was performed to pexy the lateral gutter and close the breast base with.  At each successive run of the suture, the sizer were placed and the patient was examined for implant position and symmetry.  Ultimately, a 445 mL SRM sizer was chosen and felt to be a good Orthoptist.  The expander was removed.  A superior and medial capsulotomy was performed to allow expansion of the pocket superiorly and the wound was copiously  irrigated with triple antibiotic irrigation.  Next, I placed the stitches in the capsule.  The skin was reprepped with Betadine and the formal implant was placed after I changed my gloves.  The capsule was closed with the interrupted 3-0 Vicryl sutures and then deep layer 3-0 Vicryl, and 4-0 Monocryl completed the closure.  Needle, sponge, and lap counts were correct. She tolerated the procedure well and was transferred to recovery in a compressive bra and good condition.          ______________________________ Cleda Daub, MD     BSC/MEDQ  D:  03/18/2015  T:  03/19/2015  Job:  [id number]

## 2015-03-20 ENCOUNTER — Encounter: Payer: Self-pay | Admitting: Plastic Surgery

## 2015-03-26 ENCOUNTER — Other Ambulatory Visit: Payer: Self-pay | Admitting: Hematology and Oncology

## 2015-05-21 ENCOUNTER — Other Ambulatory Visit: Payer: Self-pay | Admitting: Internal Medicine

## 2015-08-14 DIAGNOSIS — Z124 Encounter for screening for malignant neoplasm of cervix: Secondary | ICD-10-CM | POA: Diagnosis not present

## 2015-08-14 DIAGNOSIS — Z01419 Encounter for gynecological examination (general) (routine) without abnormal findings: Secondary | ICD-10-CM | POA: Diagnosis not present

## 2015-08-29 ENCOUNTER — Other Ambulatory Visit: Payer: Self-pay | Admitting: *Deleted

## 2015-08-29 ENCOUNTER — Other Ambulatory Visit: Payer: 59

## 2015-08-29 ENCOUNTER — Ambulatory Visit: Payer: 59 | Admitting: Hematology and Oncology

## 2015-08-29 NOTE — Patient Outreach (Signed)
[facility]) Care Management  08/29/2015  EMMAGRACE CICCHETTI [birth year] [id number]  RN attempted outreach call to pt however only able to leave a message. Will continue attempts to reach pt for [facility] services.  Raina Mina, RN Care Management Coordinator [place] Office [phone]

## 2015-08-30 ENCOUNTER — Other Ambulatory Visit: Payer: Self-pay | Admitting: *Deleted

## 2015-08-30 ENCOUNTER — Ambulatory Visit (INDEPENDENT_AMBULATORY_CARE_PROVIDER_SITE_OTHER): Payer: 59 | Admitting: Internal Medicine

## 2015-08-30 ENCOUNTER — Encounter: Payer: Self-pay | Admitting: Internal Medicine

## 2015-08-30 VITALS — BP 111/80 | HR 86 | Resp 16 | Ht 66.0 in | Wt 165.8 lb

## 2015-08-30 DIAGNOSIS — C50911 Malignant neoplasm of unspecified site of right female breast: Secondary | ICD-10-CM

## 2015-08-30 DIAGNOSIS — M7072 Other bursitis of hip, left hip: Secondary | ICD-10-CM | POA: Diagnosis not present

## 2015-08-30 MED ORDER — HYDROCODONE-ACETAMINOPHEN 5-325 MG PO TABS: 1.0000 | ORAL_TABLET | Freq: Four times a day (QID) | ORAL | Status: DC | PRN

## 2015-08-30 MED ORDER — METHYLPREDNISOLONE 4 MG PO TBPK: ORAL_TABLET | ORAL | Status: DC

## 2015-08-30 NOTE — Patient Outreach (Signed)
[facility]) Care Management  08/30/2015  LISL KENNIS [birth year] [id number]  Rn attempted once again to reach pt however unsuccessful. HIPAA approved voice message left with a request to return call to his RN. Will attempt once again to contact this pt this week prior to case closure if unsuccessful.  Raina Mina, RN Care Management Coordinator [place] Office [phone]

## 2015-08-30 NOTE — Patient Instructions (Signed)
Hip Bursitis Bursitis is a swelling and soreness (inflammation) of a fluid-filled sac (bursa). This sac overlies and protects the joints.  CAUSES   Injury.  Overuse of the muscles surrounding the joint.  Arthritis.  Gout.  Infection.  Cold weather.  Inadequate warm-up and conditioning prior to activities. The cause may not be known.  SYMPTOMS   Mild to severe irritation.  Tenderness and swelling over the outside of the hip.  Pain with motion of the hip.  If the bursa becomes infected, a fever may be present. Redness, tenderness, and warmth will develop over the hip. Symptoms usually lessen in 3 to 4 weeks with treatment, but can come back. TREATMENT If conservative treatment does not work, your caregiver may advise draining the bursa and injecting cortisone into the area. This may speed up the healing process. This may also be used as an initial treatment of choice. HOME CARE INSTRUCTIONS   Apply ice to the affected area for 15-20 minutes every 3 to 4 hours while awake for the first 2 days. Put the ice in a plastic bag and place a towel between the bag of ice and your skin.  Rest the painful joint as much as possible, but continue to put the joint through a normal range of motion at least 4 times per day. When the pain lessens, begin normal, slow movements and usual activities to help prevent stiffness of the hip.  Only take over-the-counter or prescription medicines for pain, discomfort, or fever as directed by your caregiver.  Use crutches to limit weight bearing on the hip joint, if advised.  Elevate your painful hip to reduce swelling. Use pillows for propping and cushioning your legs and hips.  Gentle massage may provide comfort and decrease swelling. SEEK IMMEDIATE MEDICAL CARE IF:   Your pain increases even during treatment, or you are not improving.  You have a fever.  You have heat and inflammation over the involved bursa.  You have any other questions or  concerns. MAKE SURE YOU:   Understand these instructions.  Will watch your condition.  Will get help right away if you are not doing well or get worse.   This information is not intended to replace advice given to you by your health care provider. Make sure you discuss any questions you have with your health care provider.   Document Released: 08/23/2001 Document Revised: 05/26/2011 Document Reviewed: 10/03/2014 Elsevier Interactive Patient Education 2016 [employer] Inc.  

## 2015-08-30 NOTE — Progress Notes (Signed)
Date:  08/30/2015   Name:  [name]   DOB:  29-Sep-1950   MRN:  [id number]   Chief Complaint: Hip Pain Hip Pain  The incident occurred at work (excessive work hours last week). The pain is present in the left hip. The quality of the pain is described as aching and shooting. The pain is moderate. The pain has been fluctuating since onset. Pertinent negatives include no numbness. The symptoms are aggravated by weight bearing. She has tried ice, NSAIDs and acetaminophen for the symptoms. The treatment provided moderate relief.      Review of Systems  Constitutional: Negative for fever, chills and fatigue.  Respiratory: Negative for cough and shortness of breath.   Cardiovascular: Negative for chest pain.  Musculoskeletal: Positive for arthralgias.  Neurological: Negative for weakness and numbness.    Patient Active Problem List   Diagnosis Date Noted  . Environmental and seasonal allergies 12/18/2014  . Carcinoma of vulva ([place]) 12/18/2014  . Alopecia 12/18/2014  . Edema extremities 12/18/2014  . Acid reflux 12/18/2014  . HBV (hepatitis B virus) infection 12/18/2014  . Malignant neoplasm of breast ([place]) 12/18/2014  . Hyperlipidemia, mild 12/18/2014    Prior to Admission medications   Medication Sig Start Date End Date Taking? Authorizing Provider  Calcium Carbonate-Vitamin D (CALCIUM + D PO) Take by mouth.   Yes Historical Provider, MD  exemestane (AROMASIN) 25 MG tablet TAKE 1 TABLET (25 MG TOTAL) BY MOUTH DAILY AFTER BREAKFAST. 03/26/15  Yes Lequita Asal, MD  fexofenadine (ALLEGRA) 180 MG tablet Take 1 tablet by mouth daily. 03/01/14  Yes Historical Provider, MD  glucosamine-chondroitin 500-400 MG tablet Take 1 tablet by mouth 2 (two) times daily.   Yes Historical Provider, MD  Magnesium 500 MG TABS Take by mouth.   Yes Historical Provider, MD  Melatonin 5 MG TABS Take 5 mg by mouth at bedtime as needed.   Yes Historical Provider, MD  MULTIPLE VITAMIN PO Take 1  tablet by mouth daily.   Yes Historical Provider, MD  Omega 3 1000 MG CAPS Take 1,000 mg by mouth 2 (two) times daily.   Yes Historical Provider, MD  omeprazole (PRILOSEC) 20 MG capsule TAKE 1 CAPSULE BY MOUTH ONCE DAILY 05/21/15  Yes Glean Hess, MD  Probiotic Product (PROBIOTIC DAILY PO) Take 10 mg by mouth 2 (two) times daily.   Yes Historical Provider, MD  triamcinolone (NASACORT ALLERGY 24HR) 55 MCG/ACT AERO nasal inhaler Place into the nose. 03/29/14  Yes Historical Provider, MD  Turmeric Curcumin 500 MG CAPS Take 2 tablets by mouth daily.    Yes Historical Provider, MD    Allergies  Allergen Reactions  . Penicillins Rash  . Sulfa Antibiotics Rash    Past Surgical History  Procedure Laterality Date  . Abdominal hysterectomy    . Total mastectomy Right 02/2013    stage 1 ER/PR+  . Vulvectomy    . Tissue expander placement Right 01/18/2015    Procedure: Right  BREAST EXPANDER PLACEMENT WITH ALLODERM;  Surgeon: Nicholaus Bloom, MD;  Location: [place];  Service: Plastics;  Laterality: Right;  DR COAN RM 2 PER BRENDA  . Breast reduction surgery Left 01/18/2015    Procedure: MAMMARY REDUCTION  (BREAST);  Surgeon: Nicholaus Bloom, MD;  Location: [place];  Service: Plastics;  Laterality: Left;  . Tissue expander placement Right 03/13/2015    Procedure: Right breast tissue expander removal and Sillicone implant placement.;  Surgeon: Nicholaus Bloom, MD;  Location:  [place];  Service: Plastics;  Laterality: Right;    Social History  Substance Use Topics  . Smoking status: Never Smoker   . Smokeless tobacco: Never Used  . Alcohol Use: 1.8 oz/week    3 Glasses of wine per week     Medication list has been reviewed and updated.   Physical Exam  Constitutional: She is oriented to person, place, and time. She appears well-developed. No distress.  HENT:  Head: Normocephalic and atraumatic.  Cardiovascular: Normal rate, regular rhythm and normal heart sounds.     Pulmonary/Chest: Effort normal and breath sounds normal. No respiratory distress.  Musculoskeletal:       Right hip: Normal.       Left hip: She exhibits tenderness (over lateral hip bursa). She exhibits normal range of motion and normal strength.       Lumbar back: She exhibits normal range of motion, no tenderness and no bony tenderness.  Neurological: She is alert and oriented to person, place, and time.  Reflex Scores:      Patellar reflexes are 1+ on the right side and 1+ on the left side.      Achilles reflexes are 1+ on the right side and 1+ on the left side. Skin: Skin is warm and dry. No rash noted.  Psychiatric: She has a normal mood and affect. Her behavior is normal. Thought content normal.  Nursing note and vitals reviewed.   BP 111/80 mmHg  Pulse 86  Resp 16  Ht 5\' 6"  (1.676 m)  Wt 165 lb 12.8 oz (75.206 kg)  BMI 26.77 kg/m2  SpO2 96%  Assessment and Plan: 1. Bursitis, hip, left Continue ice, limited activities Hydrocodone at night to allow sleep Follow up if not improving - methylPREDNISolone (MEDROL DOSEPAK) 4 MG TBPK tablet; Take 6 pills on day 1 the 5 pills day 2 then 4 pills day 3 then 3 pills day 4 then 2 pills day 5 then one pills day 6 then stop  Dispense: 21 tablet; Refill: 0 - HYDROcodone-acetaminophen (NORCO/VICODIN) 5-325 MG tablet; Take 1 tablet by mouth every 6 (six) hours as needed for moderate pain.  Dispense: 30 tablet; Refill: 0  2. Malignant neoplasm of right female breast, unspecified site of breast ([place]) On Aromasin; followed by Oncology - MM Digital Screening Unilat L; Future   Halina Maidens, MD [facility]  08/30/2015

## 2015-08-31 ENCOUNTER — Other Ambulatory Visit: Payer: Self-pay | Admitting: *Deleted

## 2015-08-31 NOTE — Patient Outreach (Signed)
[facility]) Care Management  08/31/2015  [name] 1951-03-06 [id number]   RN attempted to contact pt on the home number and informed to contact pt on her mobile. RN spoke with pt and completed the screening and introduced the [facility] program and purpose for today's call. Pt explained her recent medical issues and verified she has a good support system along with a primary provider she recent visited on yesterday. Pt also indicates she uses the [facility] pharmacy. RN offered resources and [facility] services however pt declined but appreciative for the call today. Due to no needs will close case and provide contact number if needed in the future.  Raina Mina, RN Care Management Coordinator [place] Office [phone]

## 2015-10-01 ENCOUNTER — Other Ambulatory Visit: Payer: Self-pay

## 2015-10-01 DIAGNOSIS — C50911 Malignant neoplasm of unspecified site of right female breast: Secondary | ICD-10-CM

## 2015-10-03 ENCOUNTER — Inpatient Hospital Stay (HOSPITAL_BASED_OUTPATIENT_CLINIC_OR_DEPARTMENT_OTHER): Payer: 59 | Admitting: Hematology and Oncology

## 2015-10-03 ENCOUNTER — Encounter: Payer: Self-pay | Admitting: Hematology and Oncology

## 2015-10-03 ENCOUNTER — Inpatient Hospital Stay: Payer: 59 | Attending: Hematology and Oncology

## 2015-10-03 VITALS — BP 129/75 | HR 84 | Temp 97.6°F | Resp 18 | Wt 167.3 lb

## 2015-10-03 DIAGNOSIS — Z170 Estrogen receptor positive status [ER+]: Secondary | ICD-10-CM | POA: Diagnosis not present

## 2015-10-03 DIAGNOSIS — M129 Arthropathy, unspecified: Secondary | ICD-10-CM | POA: Insufficient documentation

## 2015-10-03 DIAGNOSIS — Z79899 Other long term (current) drug therapy: Secondary | ICD-10-CM

## 2015-10-03 DIAGNOSIS — Z803 Family history of malignant neoplasm of breast: Secondary | ICD-10-CM | POA: Insufficient documentation

## 2015-10-03 DIAGNOSIS — C50911 Malignant neoplasm of unspecified site of right female breast: Secondary | ICD-10-CM | POA: Insufficient documentation

## 2015-10-03 DIAGNOSIS — Z8619 Personal history of other infectious and parasitic diseases: Secondary | ICD-10-CM | POA: Diagnosis not present

## 2015-10-03 DIAGNOSIS — Z90710 Acquired absence of both cervix and uterus: Secondary | ICD-10-CM | POA: Insufficient documentation

## 2015-10-03 DIAGNOSIS — Z79811 Long term (current) use of aromatase inhibitors: Secondary | ICD-10-CM

## 2015-10-03 DIAGNOSIS — R232 Flushing: Secondary | ICD-10-CM | POA: Diagnosis not present

## 2015-10-03 DIAGNOSIS — Z8589 Personal history of malignant neoplasm of other organs and systems: Secondary | ICD-10-CM | POA: Insufficient documentation

## 2015-10-03 DIAGNOSIS — G4700 Insomnia, unspecified: Secondary | ICD-10-CM | POA: Diagnosis not present

## 2015-10-03 LAB — CBC WITH DIFFERENTIAL/PLATELET
Basophils Absolute: 0.1 10*3/uL (ref 0–0.1)
Basophils Relative: 1 %
Eosinophils Absolute: 0.2 10*3/uL (ref 0–0.7)
Eosinophils Relative: 3 %
HCT: 43.7 % (ref 35.0–47.0)
Hemoglobin: 14.8 g/dL (ref 12.0–16.0)
Lymphocytes Relative: 22 %
Lymphs Abs: 1.6 10*3/uL (ref 1.0–3.6)
MCH: 31.1 pg (ref 26.0–34.0)
MCHC: 33.9 g/dL (ref 32.0–36.0)
MCV: 91.7 fL (ref 80.0–100.0)
Monocytes Absolute: 1 10*3/uL — ABNORMAL HIGH (ref 0.2–0.9)
Monocytes Relative: 14 %
Neutro Abs: 4.2 10*3/uL (ref 1.4–6.5)
Neutrophils Relative %: 60 %
Platelets: 287 10*3/uL (ref 150–440)
RBC: 4.76 MIL/uL (ref 3.80–5.20)
RDW: 13.6 % (ref 11.5–14.5)
WBC: 7.1 10*3/uL (ref 3.6–11.0)

## 2015-10-03 LAB — COMPREHENSIVE METABOLIC PANEL
ALT: 20 U/L (ref 14–54)
AST: 23 U/L (ref 15–41)
Albumin: 4 g/dL (ref 3.5–5.0)
Alkaline Phosphatase: 69 U/L (ref 38–126)
Anion gap: 8 (ref 5–15)
BUN: 18 mg/dL (ref 6–20)
CO2: 26 mmol/L (ref 22–32)
Calcium: 9 mg/dL (ref 8.9–10.3)
Chloride: 102 mmol/L (ref 101–111)
Creatinine, Ser: 0.81 mg/dL (ref 0.44–1.00)
GFR calc Af Amer: >60 mL/min (ref >60)
GFR calc non Af Amer: >60 mL/min (ref >60)
Glucose, Bld: 105 mg/dL — ABNORMAL HIGH (ref 65–99)
Potassium: 4 mmol/L (ref 3.5–5.1)
Sodium: 136 mmol/L (ref 135–145)
Total Bilirubin: 0.9 mg/dL (ref 0.3–1.2)
Total Protein: 7.6 g/dL (ref 6.5–8.1)

## 2015-10-03 NOTE — Progress Notes (Signed)
Patient is here for follow up, no complaints she is doing very well.

## 2015-10-03 NOTE — Progress Notes (Signed)
[facility] day:  10/03/2015   Chief Complaint: [name] is a 65 y.o. female with a history of stage IA right breast cancer who is seen for 7 month assessment.  HPI: The patient was last seen in the medical oncology clinic on 02/28/2015.  At that time, she was doing well.  Exam revealed postoperative changes.  CBC with diff, CMP, and CA27.29 were normal.  During the interim, she has done well.  She notes some hot flashes and sleep issues.  She also notes some minor joint aches.     Past Medical History  Diagnosis Date  . Breast cancer ([place]) 02/2014  . Cancer ([place])     Vulvular  . PONV (postoperative nausea and vomiting)     pt states scopalamine patch helped and zofran and phenergan helped  . Arthritis   . Hepatitis     Hep B in 1981    Past Surgical History  Procedure Laterality Date  . Abdominal hysterectomy    . Total mastectomy Right 02/2013    stage 1 ER/PR+  . Vulvectomy    . Tissue expander placement Right 01/18/2015    Procedure: Right  BREAST EXPANDER PLACEMENT WITH ALLODERM;  Surgeon: Nicholaus Bloom, MD;  Location: [place];  Service: Plastics;  Laterality: Right;  DR COAN RM 2 PER BRENDA  . Breast reduction surgery Left 01/18/2015    Procedure: MAMMARY REDUCTION  (BREAST);  Surgeon: Nicholaus Bloom, MD;  Location: [place];  Service: Plastics;  Laterality: Left;  . Tissue expander placement Right 03/13/2015    Procedure: Right breast tissue expander removal and Sillicone implant placement.;  Surgeon: Nicholaus Bloom, MD;  Location: [place];  Service: Plastics;  Laterality: Right;    Family History  Problem Relation Age of Onset  . Hypertension Father   . Diabetes Father   . Stroke Father   . Diabetes Brother   . Hypertension Mother   . Thyroid disease Mother   . Leukemia Maternal Grandmother   . Breast cancer Paternal Grandmother     Social History:  reports that she has never smoked. She  has never used smokeless tobacco. She reports that she drinks about 1.8 oz of alcohol per week. She reports that she does not use illicit drugs.  The patient is alone today.  Allergies:  Allergies  Allergen Reactions  . Penicillins Rash  . Sulfa Antibiotics Rash    Current Medications: Current Outpatient Prescriptions  Medication Sig Dispense Refill  . Calcium Carbonate-Vitamin D (CALCIUM + D PO) Take by mouth.    Marland Kitchen exemestane (AROMASIN) 25 MG tablet TAKE 1 TABLET (25 MG TOTAL) BY MOUTH DAILY AFTER BREAKFAST. 90 tablet 0  . fexofenadine (ALLEGRA) 180 MG tablet Take 1 tablet by mouth daily.    Marland Kitchen glucosamine-chondroitin 500-400 MG tablet Take 1 tablet by mouth 2 (two) times daily.    Marland Kitchen HYDROcodone-acetaminophen (NORCO/VICODIN) 5-325 MG tablet Take 1 tablet by mouth every 6 (six) hours as needed for moderate pain. 30 tablet 0  . Magnesium 500 MG TABS Take by mouth.    . Melatonin 5 MG TABS Take 5 mg by mouth at bedtime as needed.    . methylPREDNISolone (MEDROL DOSEPAK) 4 MG TBPK tablet Take 6 pills on day 1 the 5 pills day 2 then 4 pills day 3 then 3 pills day 4 then 2 pills day 5 then one pills day 6 then stop 21 tablet 0  . MULTIPLE  VITAMIN PO Take 1 tablet by mouth daily.    . Omega 3 1000 MG CAPS Take 1,000 mg by mouth 2 (two) times daily.    Marland Kitchen omeprazole (PRILOSEC) 20 MG capsule TAKE 1 CAPSULE BY MOUTH ONCE DAILY 90 capsule 3  . Probiotic Product (PROBIOTIC DAILY PO) Take 10 mg by mouth 2 (two) times daily.    Marland Kitchen triamcinolone (NASACORT ALLERGY 24HR) 55 MCG/ACT AERO nasal inhaler Place into the nose.    . Turmeric Curcumin 500 MG CAPS Take 2 tablets by mouth daily.      No current facility-administered medications for this visit.    Review of Systems:  GENERAL:  Feels "good".  No fevers, sweats or weight loss.  Weight up 4 pounds. PERFORMANCE STATUS (ECOG):  1 HEENT:  No visual changes, runny nose, sore throat, mouth sores or tenderness. Lungs: No shortness of breath or cough.  No  hemoptysis. Cardiac:  No chest pain, palpitations, orthopnea, or PND. GI:  No nausea, vomiting, diarrhea, constipation, melena or hematochezia. GU:  No urgency, frequency, dysuria, or hematuria. Musculoskeletal:  Mild joint aches (stable).  No muscle tenderness. Extremities:  No pain or swelling. Skin:  Interval left sided nipple infection.  No rashes or skin changes. Neuro:  No headache, numbness or weakness, balance or coordination issues. Endocrine:  Some hot flashes.  No diabetes, thyroid issues. Psych:  No mood changes, depression or anxiety. Pain:  No focal pain. Review of systems:  All other systems reviewed and found to be negative.  Physical Exam: Blood pressure 129/75, pulse 84, temperature 97.6 F (36.4 C), temperature source Tympanic, resp. rate 18, weight 167 lb 5.3 oz (75.9 kg). GENERAL:  Well developed, well nourished, sitting comfortably in the exam room in no acute distress. MENTAL STATUS:  Alert and oriented to person, place and time. HEAD:  Short dark blonde hair.  Normocephalic, atraumatic, face symmetric, no Cushingoid features. EYES:  Glasses.  Blue eyes.  Pupils equal round and reactive to light and accomodation.  No conjunctivitis or scleral icterus. ENT:  Oropharynx clear without lesion.  Tongue normal. Mucous membranes moist.  RESPIRATORY:  Clear to auscultation without rales, wheezes or rhonchi. CARDIOVASCULAR:  Regular rate and rhythm without murmur, rub or gallop. BREAST:  Right sided reconstruction with horizontal well healed incision.  No erythema or nodularity.  Left breast without masses, skin changes or nipple discharge.  Inferior small vertical incision with post-operative changes. ABDOMEN:  Soft, non-tender, with active bowel sounds, and no hepatosplenomegaly.  No masses. SKIN:  No rashes, ulcers or lesions. EXTREMITIES: No edema, no skin discoloration or tenderness.  No palpable cords. LYMPH NODES: No palpable cervical, supraclavicular, axillary or  inguinal adenopathy  NEUROLOGICAL: Unremarkable. PSYCH:  Appropriate.   Appointment on 10/03/2015  Component Date Value Ref Range Status  . WBC 10/03/2015 7.1  3.6 - 11.0 K/uL Final  . RBC 10/03/2015 4.76  3.80 - 5.20 MIL/uL Final  . Hemoglobin 10/03/2015 14.8  12.0 - 16.0 g/dL Final  . HCT 10/03/2015 43.7  35.0 - 47.0 % Final  . MCV 10/03/2015 91.7  80.0 - 100.0 fL Final  . MCH 10/03/2015 31.1  26.0 - 34.0 pg Final  . MCHC 10/03/2015 33.9  32.0 - 36.0 g/dL Final  . RDW 10/03/2015 13.6  11.5 - 14.5 % Final  . Platelets 10/03/2015 287  150 - 440 K/uL Final  . Neutrophils Relative % 10/03/2015 60   Final  . Neutro Abs 10/03/2015 4.2  1.4 - 6.5 K/uL Final  .  Lymphocytes Relative 10/03/2015 22   Final  . Lymphs Abs 10/03/2015 1.6  1.0 - 3.6 K/uL Final  . Monocytes Relative 10/03/2015 14   Final  . Monocytes Absolute 10/03/2015 1.0* 0.2 - 0.9 K/uL Final  . Eosinophils Relative 10/03/2015 3   Final  . Eosinophils Absolute 10/03/2015 0.2  0 - 0.7 K/uL Final  . Basophils Relative 10/03/2015 1   Final  . Basophils Absolute 10/03/2015 0.1  0 - 0.1 K/uL Final  . Sodium 10/03/2015 136  135 - 145 mmol/L Final  . Potassium 10/03/2015 4.0  3.5 - 5.1 mmol/L Final  . Chloride 10/03/2015 102  101 - 111 mmol/L Final  . CO2 10/03/2015 26  22 - 32 mmol/L Final  . Glucose, Bld 10/03/2015 105* 65 - 99 mg/dL Final  . BUN 10/03/2015 18  6 - 20 mg/dL Final  . Creatinine, Ser 10/03/2015 0.81  0.44 - 1.00 mg/dL Final  . Calcium 10/03/2015 9.0  8.9 - 10.3 mg/dL Final  . Total Protein 10/03/2015 7.6  6.5 - 8.1 g/dL Final  . Albumin 10/03/2015 4.0  3.5 - 5.0 g/dL Final  . AST 10/03/2015 23  15 - 41 U/L Final  . ALT 10/03/2015 20  14 - 54 U/L Final  . Alkaline Phosphatase 10/03/2015 69  38 - 126 U/L Final  . Total Bilirubin 10/03/2015 0.9  0.3 - 1.2 mg/dL Final  . GFR calc non Af Amer 10/03/2015 >60  >60 mL/min Final  . GFR calc Af Amer 10/03/2015 >60  >60 mL/min Final   Comment: (NOTE) The eGFR has been  calculated using the CKD EPI equation. This calculation has not been validated in all clinical situations. eGFR's persistently <60 mL/min signify possible Chronic Kidney Disease.   . Anion gap 10/03/2015 8  5 - 15 Final    Assessment:  [name] is a 65 y.o. female with a history of stage IA right breast cancer status post mastectomy on 02/24/2013. 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 Sentinel lymph node was negative. Tumor was ER/PR positive and HER-2/neu negative.  She has been on aromatase inhibitors since 03/2013. She was initially on Femara than Arimidex, but discontinued secondary to hot flashes and joint pain. Currently she is tolerating Aromasin.  CA27.29 was 22.6 (0-38.6) on 09/20/2014, 25.3 on 02/28/2015, and 20.8 on 10/03/2015.  Bone density study on 06/06/2014 revealed a T score of -0.9 in the L1-L4 region and -0.7 in the right femoral neck (normal).  She underwent right sided breast reconstruction and left sided breast reduction on 01/18/2015.  Left sided mammogram on 11/06/2014 revealed no evidence of malignancy.   She had a history of an abnormal PAP smear and is status post hysterectomy. 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 BRCA 1 and 2 testing was negative.   Symptomatically, she is doing well.  She has some minor joint aches and hot flashes.  Exam reveals stable post-operative changes.  Plan: 1. Labs today:  CBC with diff, CMP, CA27.29. 2. Continue Aromasin. 3. Discuss adjuvant hormonal therapy.  Discuss consideration of BCI testing to assess benefit of 5 versus 10 years of adjuvant hormonal therapy.  Patient interested. 4. Continue calcium and vitamin D. 5. Schedule left mammogram 11/06/2015. 6. RTC in 6 months for MD assess, labs (CBC with diff, CMP, CA27.29)   Lequita Asal, MD  10/03/2015, 9:51 AM

## 2015-10-04 LAB — CANCER ANTIGEN 27.29: CA 27.29: 20.8 U/mL (ref 0.0–38.6)

## 2015-10-11 ENCOUNTER — Other Ambulatory Visit: Payer: Self-pay | Admitting: Hematology and Oncology

## 2015-11-13 ENCOUNTER — Ambulatory Visit
Admission: RE | Admit: 2015-11-13 | Discharge: 2015-11-13 | Disposition: A | Discharge status: Home / Self Care | Payer: 59 | Source: Ambulatory Visit | Attending: Hematology and Oncology | Admitting: Hematology and Oncology

## 2015-11-13 ENCOUNTER — Ambulatory Visit: Payer: 59

## 2015-11-13 ENCOUNTER — Other Ambulatory Visit: Payer: Self-pay | Admitting: Hematology and Oncology

## 2015-11-13 DIAGNOSIS — R921 Mammographic calcification found on diagnostic imaging of breast: Secondary | ICD-10-CM

## 2015-11-13 DIAGNOSIS — Z1231 Encounter for screening mammogram for malignant neoplasm of breast: Secondary | ICD-10-CM | POA: Insufficient documentation

## 2015-11-13 DIAGNOSIS — C50911 Malignant neoplasm of unspecified site of right female breast: Secondary | ICD-10-CM

## 2015-11-13 DIAGNOSIS — R928 Other abnormal and inconclusive findings on diagnostic imaging of breast: Secondary | ICD-10-CM | POA: Diagnosis not present

## 2015-11-14 ENCOUNTER — Telehealth: Payer: Self-pay | Admitting: *Deleted

## 2015-11-14 ENCOUNTER — Other Ambulatory Visit: Payer: Self-pay | Admitting: *Deleted

## 2015-11-14 DIAGNOSIS — R928 Other abnormal and inconclusive findings on diagnostic imaging of breast: Secondary | ICD-10-CM

## 2015-11-14 NOTE — Telephone Encounter (Signed)
Dr. Mike Gip made me aware that her screening mammogram showed that she has some calcifications of left breast and will need diag. Mammogram with poss. U/s.  Orders entered and I called pt to let her know that it showed calcifications and she was walking up the hallway where mammography was at and she will stop by to get appt.  I also spoke to jamie in mammography to see what order I needed to enter

## 2015-11-16 ENCOUNTER — Ambulatory Visit
Admission: RE | Admit: 2015-11-16 | Discharge: 2015-11-16 | Disposition: A | Discharge status: Home / Self Care | Payer: 59 | Source: Ambulatory Visit

## 2015-11-16 ENCOUNTER — Ambulatory Visit
Admission: RE | Admit: 2015-11-16 | Discharge: 2015-11-16 | Disposition: A | Discharge status: Home / Self Care | Payer: 59 | Source: Ambulatory Visit | Attending: Hematology and Oncology | Admitting: Hematology and Oncology

## 2015-11-16 DIAGNOSIS — R921 Mammographic calcification found on diagnostic imaging of breast: Secondary | ICD-10-CM

## 2015-11-16 DIAGNOSIS — R922 Inconclusive mammogram: Secondary | ICD-10-CM | POA: Diagnosis not present

## 2015-11-22 ENCOUNTER — Telehealth: Payer: Self-pay | Admitting: *Deleted

## 2015-11-22 NOTE — Telephone Encounter (Signed)
Called pt with results and the radiologist had already told pt about results and it was probably scar tissue from where she had the plastic surgery. She needs another mammogram in 6 months and order entered and colette to sch. The mammogram for 05/16/2016. Pt agreeable to the plan

## 2015-11-22 NOTE — Telephone Encounter (Signed)
-----   Message from Lequita Asal, MD sent at 11/16/2015 12:21 PM EDT ----- Regarding: follow-up mammogram in 6 months  Will set up f/u mammogram in 6 months.  M ----- Message ----- From: Interface, Rad Results In Sent: 11/16/2015  10:00 AM To: Lequita Asal, MD

## 2015-11-27 ENCOUNTER — Ambulatory Visit: Payer: 59

## 2015-11-27 ENCOUNTER — Other Ambulatory Visit: Payer: 59

## 2016-01-21 DIAGNOSIS — H5213 Myopia, bilateral: Secondary | ICD-10-CM | POA: Diagnosis not present

## 2016-04-02 ENCOUNTER — Other Ambulatory Visit: Payer: 59

## 2016-04-02 ENCOUNTER — Ambulatory Visit: Payer: 59 | Admitting: Hematology and Oncology

## 2016-04-15 ENCOUNTER — Other Ambulatory Visit: Payer: Self-pay | Admitting: Hematology and Oncology

## 2016-05-19 ENCOUNTER — Ambulatory Visit
Admission: RE | Admit: 2016-05-19 | Discharge: 2016-05-19 | Disposition: A | Discharge status: Home / Self Care | Payer: 59 | Source: Ambulatory Visit | Attending: Hematology and Oncology | Admitting: Hematology and Oncology

## 2016-05-19 ENCOUNTER — Other Ambulatory Visit: Payer: Self-pay | Admitting: Hematology and Oncology

## 2016-05-19 DIAGNOSIS — R928 Other abnormal and inconclusive findings on diagnostic imaging of breast: Secondary | ICD-10-CM

## 2016-05-19 DIAGNOSIS — C50911 Malignant neoplasm of unspecified site of right female breast: Secondary | ICD-10-CM

## 2016-05-19 DIAGNOSIS — R921 Mammographic calcification found on diagnostic imaging of breast: Secondary | ICD-10-CM | POA: Diagnosis not present

## 2016-05-28 ENCOUNTER — Other Ambulatory Visit: Payer: Self-pay | Admitting: *Deleted

## 2016-05-28 ENCOUNTER — Inpatient Hospital Stay (HOSPITAL_BASED_OUTPATIENT_CLINIC_OR_DEPARTMENT_OTHER): Payer: 59 | Admitting: Hematology and Oncology

## 2016-05-28 ENCOUNTER — Encounter: Payer: Self-pay | Admitting: Hematology and Oncology

## 2016-05-28 ENCOUNTER — Inpatient Hospital Stay: Payer: 59 | Attending: Hematology and Oncology

## 2016-05-28 VITALS — BP 123/78 | HR 80 | Temp 97.0°F | Ht 66.0 in | Wt 162.8 lb

## 2016-05-28 DIAGNOSIS — Z90710 Acquired absence of both cervix and uterus: Secondary | ICD-10-CM

## 2016-05-28 DIAGNOSIS — Z807 Family history of other malignant neoplasms of lymphoid, hematopoietic and related tissues: Secondary | ICD-10-CM | POA: Diagnosis not present

## 2016-05-28 DIAGNOSIS — Z9011 Acquired absence of right breast and nipple: Secondary | ICD-10-CM | POA: Insufficient documentation

## 2016-05-28 DIAGNOSIS — Z79811 Long term (current) use of aromatase inhibitors: Secondary | ICD-10-CM | POA: Insufficient documentation

## 2016-05-28 DIAGNOSIS — Z8589 Personal history of malignant neoplasm of other organs and systems: Secondary | ICD-10-CM | POA: Diagnosis not present

## 2016-05-28 DIAGNOSIS — R232 Flushing: Secondary | ICD-10-CM | POA: Diagnosis not present

## 2016-05-28 DIAGNOSIS — Z803 Family history of malignant neoplasm of breast: Secondary | ICD-10-CM | POA: Insufficient documentation

## 2016-05-28 DIAGNOSIS — M2550 Pain in unspecified joint: Secondary | ICD-10-CM

## 2016-05-28 DIAGNOSIS — Z79899 Other long term (current) drug therapy: Secondary | ICD-10-CM

## 2016-05-28 DIAGNOSIS — Z853 Personal history of malignant neoplasm of breast: Secondary | ICD-10-CM

## 2016-05-28 DIAGNOSIS — Z170 Estrogen receptor positive status [ER+]: Secondary | ICD-10-CM | POA: Insufficient documentation

## 2016-05-28 DIAGNOSIS — G629 Polyneuropathy, unspecified: Secondary | ICD-10-CM | POA: Insufficient documentation

## 2016-05-28 DIAGNOSIS — C50911 Malignant neoplasm of unspecified site of right female breast: Secondary | ICD-10-CM

## 2016-05-28 LAB — CBC WITH DIFFERENTIAL/PLATELET
Basophils Absolute: 0.1 10*3/uL (ref 0–0.1)
Basophils Relative: 1 %
Eosinophils Absolute: 0.3 10*3/uL (ref 0–0.7)
Eosinophils Relative: 4 %
HCT: 45.5 % (ref 35.0–47.0)
Hemoglobin: 15.1 g/dL (ref 12.0–16.0)
Lymphocytes Relative: 23 %
Lymphs Abs: 1.7 10*3/uL (ref 1.0–3.6)
MCH: 30.4 pg (ref 26.0–34.0)
MCHC: 33.2 g/dL (ref 32.0–36.0)
MCV: 91.6 fL (ref 80.0–100.0)
Monocytes Absolute: 1.1 10*3/uL — ABNORMAL HIGH (ref 0.2–0.9)
Monocytes Relative: 15 %
Neutro Abs: 4.2 10*3/uL (ref 1.4–6.5)
Neutrophils Relative %: 57 %
Platelets: 302 10*3/uL (ref 150–440)
RBC: 4.97 MIL/uL (ref 3.80–5.20)
RDW: 13.5 % (ref 11.5–14.5)
WBC: 7.3 10*3/uL (ref 3.6–11.0)

## 2016-05-28 LAB — COMPREHENSIVE METABOLIC PANEL
ALT: 19 U/L (ref 14–54)
AST: 23 U/L (ref 15–41)
Albumin: 4 g/dL (ref 3.5–5.0)
Alkaline Phosphatase: 64 U/L (ref 38–126)
Anion gap: 7 (ref 5–15)
BUN: 16 mg/dL (ref 6–20)
CO2: 27 mmol/L (ref 22–32)
Calcium: 9 mg/dL (ref 8.9–10.3)
Chloride: 103 mmol/L (ref 101–111)
Creatinine, Ser: 0.92 mg/dL (ref 0.44–1.00)
GFR calc Af Amer: >60 mL/min (ref >60)
GFR calc non Af Amer: >60 mL/min (ref >60)
Glucose, Bld: 98 mg/dL (ref 65–99)
Potassium: 4.2 mmol/L (ref 3.5–5.1)
Sodium: 137 mmol/L (ref 135–145)
Total Bilirubin: 0.5 mg/dL (ref 0.3–1.2)
Total Protein: 7.7 g/dL (ref 6.5–8.1)

## 2016-05-28 MED ORDER — VENLAFAXINE HCL ER 37.5 MG PO CP24: 37.5000 mg | ORAL_CAPSULE | Freq: Every day | ORAL | 0 refills | Status: DC

## 2016-05-28 MED ORDER — VENLAFAXINE HCL 37.5 MG PO TABS: 37.5000 mg | ORAL_TABLET | Freq: Two times a day (BID) | ORAL | 0 refills | Status: DC

## 2016-05-28 NOTE — Progress Notes (Signed)
[facility] day:  05/28/16   Chief Complaint: [name] is a 66 y.o. female with a history of stage IA right breast cancer who is seen for 8 month assessment.    HPI: The patient was last seen in the medical oncology clinic on 10/03/2015.  At that time, she was doing well.  Exam revealed postoperative changes.  CBC with diff, CMP, and CA27.29 were normal.  Today's appointment was delayed secondary to inclement weather and vacation.    Left diagnostic mammogram on 05/19/2016 revealed changes of reduction mammoplasty.  There were developing coarse calcifications in the central left breast.  They have a dystrophic appearance c/w fat necrosis.  Diagnostic left mammogram in 11/2016 was recommended.  During the interim, she has done well.  She notes sleep issues, numerous hot flashes and night sweats.  She also notes some minor joint aches and peripheral neuropathy impairing some fine motor skills.    Past Medical History:  Diagnosis Date  . Arthritis   . Breast cancer ([place]) 02/2014  . Cancer ([place])    Vulvular  . Hepatitis    Hep B in 1981  . PONV (postoperative nausea and vomiting)    pt states scopalamine patch helped and zofran and phenergan helped    Past Surgical History:  Procedure Laterality Date  . ABDOMINAL HYSTERECTOMY    . AUGMENTATION MAMMAPLASTY Right [id number]/7672   Silicone  . BREAST BIOPSY Right    Positive  . BREAST REDUCTION SURGERY Left 01/18/2015   Procedure: MAMMARY REDUCTION  (BREAST);  Surgeon: Nicholaus Bloom, MD;  Location: [place];  Service: Plastics;  Laterality: Left;  . REDUCTION MAMMAPLASTY Left 01/2015  . TISSUE EXPANDER PLACEMENT Right 01/18/2015   Procedure: Right  BREAST EXPANDER PLACEMENT WITH ALLODERM;  Surgeon: Nicholaus Bloom, MD;  Location: [place];  Service: Plastics;  Laterality: Right;  DR COAN RM 2 PER BRENDA  . TISSUE EXPANDER PLACEMENT Right 03/13/2015   Procedure: Right breast  tissue expander removal and Sillicone implant placement.;  Surgeon: Nicholaus Bloom, MD;  Location: [place];  Service: Plastics;  Laterality: Right;  . TOTAL MASTECTOMY Right 02/2013   stage 1 ER/PR+  . VULVECTOMY      Family History  Problem Relation Age of Onset  . Hypertension Father   . Diabetes Father   . Stroke Father   . Diabetes Brother   . Hypertension Mother   . Thyroid disease Mother   . Leukemia Maternal Grandmother   . Breast cancer Paternal Grandmother   . Breast cancer Cousin     Social History:  reports that she has never smoked. She has never used smokeless tobacco. She reports that she drinks about 1.8 oz of alcohol per week . She reports that she does not use drugs.  She is a Therapist, art.  She lives in [place].  The patient is alone today.  Allergies:  Allergies  Allergen Reactions  . Penicillins Rash  . Sulfa Antibiotics Rash    Current Medications: Current Outpatient Prescriptions  Medication Sig Dispense Refill  . Calcium Carbonate-Vitamin D (CALCIUM + D PO) Take by mouth.    Marland Kitchen exemestane (AROMASIN) 25 MG tablet TAKE 1 TABLET (25 MG TOTAL) BY MOUTH DAILY AFTER BREAKFAST. 90 tablet 1  . fexofenadine (ALLEGRA) 180 MG tablet Take 1 tablet by mouth daily.    Marland Kitchen glucosamine-chondroitin 500-400 MG tablet Take 1 tablet by mouth 2 (two) times daily.    Marland Kitchen HYDROcodone-acetaminophen (  NORCO/VICODIN) 5-325 MG tablet Take 1 tablet by mouth every 6 (six) hours as needed for moderate pain. 30 tablet 0  . Magnesium 500 MG TABS Take by mouth.    . Melatonin 5 MG TABS Take 5 mg by mouth at bedtime as needed.    . methylPREDNISolone (MEDROL DOSEPAK) 4 MG TBPK tablet Take 6 pills on day 1 the 5 pills day 2 then 4 pills day 3 then 3 pills day 4 then 2 pills day 5 then one pills day 6 then stop 21 tablet 0  . MULTIPLE VITAMIN PO Take 1 tablet by mouth daily.    . Omega 3 1000 MG CAPS Take 1,000 mg by mouth 2 (two) times daily.    Marland Kitchen omeprazole (PRILOSEC) 20 MG  capsule TAKE 1 CAPSULE BY MOUTH ONCE DAILY 90 capsule 3  . Probiotic Product (PROBIOTIC DAILY PO) Take 10 mg by mouth 2 (two) times daily.    Marland Kitchen triamcinolone (NASACORT ALLERGY 24HR) 55 MCG/ACT AERO nasal inhaler Place into the nose.    . Turmeric Curcumin 500 MG CAPS Take 2 tablets by mouth daily.     Marland Kitchen venlafaxine XR (EFFEXOR-XR) 37.5 MG 24 hr capsule Take 1 capsule (37.5 mg total) by mouth daily with breakfast. 30 capsule 0   No current facility-administered medications for this visit.     Review of Systems:  GENERAL:  Feels "good".  No fevers, sweats or weight loss.  Weight down 5 pounds. PERFORMANCE STATUS (ECOG):  1 HEENT:  No visual changes, runny nose, sore throat, mouth sores or tenderness. Lungs: No shortness of breath or cough.  No hemoptysis. Cardiac:  No chest pain, palpitations, orthopnea, or PND. GI:  No nausea, vomiting, diarrhea, constipation, melena or hematochezia. GU:  No urgency, frequency, dysuria, or hematuria. Musculoskeletal:  Mild joint aches (stable).  No muscle tenderness. Extremities:  No pain or swelling. Skin:  No rashes or skin changes. Neuro:  No headache, numbness or weakness, balance or coordination issues. Endocrine:  Numerous hot flashes and night sweats.  No diabetes, thyroid issues. Psych:  No mood changes, depression or anxiety. Pain:  No focal pain. Review of systems:  All other systems reviewed and found to be negative.  Physical Exam: Blood pressure 123/78, pulse 80, temperature 97 F (36.1 C), temperature source Tympanic, height 5' 6"  (1.676 m), weight 162 lb 13 oz (73.9 kg). GENERAL:  Well developed, well nourished, woman sitting comfortably in the exam room in no acute distress. MENTAL STATUS:  Alert and oriented to person, place and time. HEAD:  Short styled strawberry blonde hair.  Normocephalic, atraumatic, face symmetric, no Cushingoid features. EYES:  Glasses.  Blue eyes.  Pupils equal round and reactive to light and accomodation.  No  conjunctivitis or scleral icterus. ENT:  Oropharynx clear without lesion.  Tongue normal. Mucous membranes moist.  RESPIRATORY:  Clear to auscultation without rales, wheezes or rhonchi. CARDIOVASCULAR:  Regular rate and rhythm without murmur, rub or gallop. BREAST:  Right sided reconstruction with midline horizontal well healed incision.  No erythema or nodularity.  Left breast without masses, skin changes or nipple discharge.  Fullness above nipple s/p surgery (chronic).  Inferior small vertical incision with mild post-operative changes. ABDOMEN:  Soft, non-tender, with active bowel sounds, and no hepatosplenomegaly.  No masses. SKIN:  No rashes, ulcers or lesions. EXTREMITIES: No edema, no skin discoloration or tenderness.  No palpable cords. LYMPH NODES: No palpable cervical, supraclavicular, axillary or inguinal adenopathy  NEUROLOGICAL: Unremarkable. PSYCH:  Appropriate.  Appointment on 05/28/2016  Component Date Value Ref Range Status  . WBC 05/28/2016 7.3  3.6 - 11.0 K/uL Final  . RBC 05/28/2016 4.97  3.80 - 5.20 MIL/uL Final  . Hemoglobin 05/28/2016 15.1  12.0 - 16.0 g/dL Final  . HCT 05/28/2016 45.5  35.0 - 47.0 % Final  . MCV 05/28/2016 91.6  80.0 - 100.0 fL Final  . MCH 05/28/2016 30.4  26.0 - 34.0 pg Final  . MCHC 05/28/2016 33.2  32.0 - 36.0 g/dL Final  . RDW 05/28/2016 13.5  11.5 - 14.5 % Final  . Platelets 05/28/2016 302  150 - 440 K/uL Final  . Neutrophils Relative % 05/28/2016 57  % Final  . Neutro Abs 05/28/2016 4.2  1.4 - 6.5 K/uL Final  . Lymphocytes Relative 05/28/2016 23  % Final  . Lymphs Abs 05/28/2016 1.7  1.0 - 3.6 K/uL Final  . Monocytes Relative 05/28/2016 15  % Final  . Monocytes Absolute 05/28/2016 1.1* 0.2 - 0.9 K/uL Final  . Eosinophils Relative 05/28/2016 4  % Final  . Eosinophils Absolute 05/28/2016 0.3  0 - 0.7 K/uL Final  . Basophils Relative 05/28/2016 1  % Final  . Basophils Absolute 05/28/2016 0.1  0 - 0.1 K/uL Final  . Sodium 05/28/2016 137   135 - 145 mmol/L Final  . Potassium 05/28/2016 4.2  3.5 - 5.1 mmol/L Final  . Chloride 05/28/2016 103  101 - 111 mmol/L Final  . CO2 05/28/2016 27  22 - 32 mmol/L Final  . Glucose, Bld 05/28/2016 98  65 - 99 mg/dL Final  . BUN 05/28/2016 16  6 - 20 mg/dL Final  . Creatinine, Ser 05/28/2016 0.92  0.44 - 1.00 mg/dL Final  . Calcium 05/28/2016 9.0  8.9 - 10.3 mg/dL Final  . Total Protein 05/28/2016 7.7  6.5 - 8.1 g/dL Final  . Albumin 05/28/2016 4.0  3.5 - 5.0 g/dL Final  . AST 05/28/2016 23  15 - 41 U/L Final  . ALT 05/28/2016 19  14 - 54 U/L Final  . Alkaline Phosphatase 05/28/2016 64  38 - 126 U/L Final  . Total Bilirubin 05/28/2016 0.5  0.3 - 1.2 mg/dL Final  . GFR calc non Af Amer 05/28/2016 >60  >60 mL/min Final  . GFR calc Af Amer 05/28/2016 >60  >60 mL/min Final   Comment: (NOTE) The eGFR has been calculated using the CKD EPI equation. This calculation has not been validated in all clinical situations. eGFR's persistently <60 mL/min signify possible Chronic Kidney Disease.   . Anion gap 05/28/2016 7  5 - 15 Final    Assessment:  SAKINA BRIONES is a 66 y.o. female with a stage IA right breast cancer status post mastectomy on 02/24/2013.  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 Sentinel lymph node was negative. Tumor was ER/PR positive and HER-2/neu negative.  She underwent right sided breast reconstruction and left sided breast reduction on 01/18/2015.  Left sided mammogram on 11/06/2014 revealed no evidence of malignancy.  Left sided diagnostic mammogram on 05/19/2016 revealed probable benign calcifications in the left breast from fat necrosis.   She has been on aromatase inhibitors since 03/2013. She was initially on Femara, then Arimidex, but discontinued secondary to hot flashes and joint pain. Currently she is tolerating Aromasin.  She continues to have joint pain, hot flashes, and night sweats.  Occasional Aleve relieves joint pain.    CA27.29 has been followed:  22.6 on 09/20/2014, 25.3 on 02/28/2015, and 20.8 on 10/03/2015.  Bone density study on 06/06/2014 revealed a T score of -0.9 in the L1-L4 region and -0.7 in the right femoral neck (normal). She continues to take daily calcium and vitamin D supplements.  She had a history of an abnormal PAP smear and is status post hysterectomy. 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 BRCA 1 and 2 testing was negative.   Symptomatically, she is doing well.  She has some moderate hot flashes and night sweats.  She has minor joint aches.  Exam reveals stable post-operative changes.  Plan: 1. Labs today:  CBC with diff, CMP, CA27.29. 2.   Review mammogram from 05/19/2016.  Discuss plan for follow-up mammogram in 11/2016. 3.   Continue Aromasin. 4.   Start Effexor XR 37.27m daily.  Evaluate dosage in 1 month.  5.   Continue calcium and vitamin D. 6.   Schedule left mammogram in 11/2016. 7.   RTC in 6 months for MD assessment, labs (CBC with diff, CMP, CA27.29), and review of mammogram.  I saw and evaluated the patient, participating in the key portions of the service and reviewing pertinent diagnostic studies and records.  I reviewed the nurse practitioner's note and agree with the findings and the plan.  The assessment and plan were discussed with the patient.  Additional diagnostic studies of a 6 month left sided diagnostic mammogram are needed to clarify the coarse calcifications noted on the recent mammogram and would change the clinical management.  Several questions were asked by the patient and answered.   ALucendia Herrlich NP   MLequita Asal MD  05/28/2016, 11:37 AM

## 2016-05-28 NOTE — Progress Notes (Signed)
Patient her for follow up. No changes since last appointment.

## 2016-05-29 LAB — CANCER ANTIGEN 27.29: CA 27.29: 22.6 U/mL (ref 0.0–38.6)

## 2016-10-24 ENCOUNTER — Other Ambulatory Visit: Payer: Self-pay | Admitting: Hematology and Oncology

## 2016-11-24 ENCOUNTER — Other Ambulatory Visit: Payer: Self-pay | Admitting: Hematology and Oncology

## 2016-11-24 ENCOUNTER — Ambulatory Visit
Admission: RE | Admit: 2016-11-24 | Discharge: 2016-11-24 | Disposition: A | Discharge status: Home / Self Care | Payer: 59 | Source: Ambulatory Visit | Attending: Hematology and Oncology | Admitting: Hematology and Oncology

## 2016-11-24 DIAGNOSIS — Z853 Personal history of malignant neoplasm of breast: Secondary | ICD-10-CM | POA: Diagnosis not present

## 2016-11-24 DIAGNOSIS — C50911 Malignant neoplasm of unspecified site of right female breast: Secondary | ICD-10-CM

## 2016-11-24 DIAGNOSIS — R922 Inconclusive mammogram: Secondary | ICD-10-CM | POA: Diagnosis not present

## 2016-12-02 NOTE — Progress Notes (Signed)
[facility] day:  12/03/16   Chief Complaint: [name] is a 66 y.o. female with a history of stage IA right breast cancer who is seen for 6 month assessment.    HPI: The patient was last seen in the medical oncology clinic on 05/28/2016.  At that time,  patient was doing well overall. She did mention some issues with her sleep, hot flashes, and night sweats. She was also experiencing polyarthralgias and peripheral neuropathy. Patient continued on Aromasin with no side effect. CBC and blood chemistries were unremarkable. CA27.29 was normal at 22.6.  Routine diagnostic mammogram on 11/24/2016 revealed no suspicious masses or concerning for malignancy in the left breast. There was a large area of evolving fat necrosis noted in the upper central and medial left breast. Recommendations were for a screening mammogram in 1 year. Patient has implants.  She plans to have them removed due to malposition. Right implant shifts into axilla at times. Surgery initially done by Dr. Patrice Paradise.   During the interim, she has done well.  She continues to experience vasomotor symptoms, however she did not start the prescribed Effexor citing that the can "just live with the symptoms".  She denies any B symptoms.    Past Medical History:  Diagnosis Date  . Arthritis   . Breast cancer ([place]) 02/2014  . 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 Hepatitis    Hep B in 1981  . PONV (postoperative nausea and vomiting)    pt states scopalamine patch helped and zofran and phenergan helped    Past Surgical History:  Procedure Laterality Date  . ABDOMINAL HYSTERECTOMY    . AUGMENTATION MAMMAPLASTY Right 74/2595   Silicone  . BREAST BIOPSY Right    Positive  . BREAST REDUCTION SURGERY Left 01/18/2015   Procedure: MAMMARY REDUCTION  (BREAST);  Surgeon: Nicholaus Bloom, MD;  Location: [place];  Service: Plastics;  Laterality: Left;  . REDUCTION MAMMAPLASTY Left 01/2015  . TISSUE  EXPANDER PLACEMENT Right 01/18/2015   Procedure: Right  BREAST EXPANDER PLACEMENT WITH ALLODERM;  Surgeon: Nicholaus Bloom, MD;  Location: [place] [place];  Service: Plastics;  Laterality: Right;  DR COAN RM 2 PER BRENDA  . TISSUE EXPANDER PLACEMENT Right 03/13/2015   Procedure: Right breast tissue expander removal and Sillicone implant placement.;  Surgeon: Nicholaus Bloom, MD;  Location: [facility];  Service: Plastics;  Laterality: Right;  . TOTAL MASTECTOMY Right 02/2013   stage 1 ER/PR+  . VULVECTOMY      Family History  Problem Relation Age of Onset  . Hypertension Father   . Diabetes Father   . Stroke Father   . Diabetes Brother   . Hypertension Mother   . Thyroid disease Mother   . Leukemia Maternal Grandmother   . Breast cancer Paternal Grandmother   . Breast cancer Cousin     Social History:  reports that she has never smoked. She has never used smokeless tobacco. She reports that she drinks about 1.8 oz of alcohol per week . She reports that she does not use drugs.  She is a Therapist, art.  She lives in [place].  The patient is alone today.  Allergies:  Allergies  Allergen Reactions  . Penicillins Rash  . Sulfa Antibiotics Rash    Current Medications: Current Outpatient Prescriptions  Medication Sig Dispense Refill  . Calcium Carbonate-Vitamin D (CALCIUM + D PO) Take by mouth.     Marland Kitchen exemestane (AROMASIN)  25 MG tablet TAKE 1 TABLET (25 MG TOTAL) BY MOUTH DAILY AFTER BREAKFAST. 90 tablet 1  . fexofenadine (ALLEGRA) 180 MG tablet Take 1 tablet by mouth daily.    . Magnesium 500 MG TABS Take by mouth.    . Melatonin 5 MG TABS Take 5 mg by mouth at bedtime as needed.    . Omega 3 1000 MG CAPS Take 1,000 mg by mouth 2 (two) times daily.    Marland Kitchen triamcinolone (NASACORT ALLERGY 24HR) 55 MCG/ACT AERO nasal inhaler Place into the nose.    . Turmeric Curcumin 500 MG CAPS Take 2 tablets by mouth daily.     Marland Kitchen glucosamine-chondroitin 500-400 MG tablet Take 1 tablet by mouth 2  (two) times daily.    Marland Kitchen HYDROcodone-acetaminophen (NORCO/VICODIN) 5-325 MG tablet Take 1 tablet by mouth every 6 (six) hours as needed for moderate pain. (Patient not taking: Reported on 12/03/2016) 30 tablet 0  . MULTIPLE VITAMIN PO Take 1 tablet by mouth daily.    Marland Kitchen omeprazole (PRILOSEC) 20 MG capsule TAKE 1 CAPSULE BY MOUTH ONCE DAILY (Patient not taking: Reported on 12/03/2016) 90 capsule 3  . Probiotic Product (PROBIOTIC DAILY PO) Take 10 mg by mouth 2 (two) times daily.     No current facility-administered medications for this visit.     Review of Systems:  GENERAL:  Feels "pretty good".  No fevers, sweats or weight loss.  Weight up 3 pounds. PERFORMANCE STATUS (ECOG):  1 HEENT:  No visual changes, runny nose, sore throat, mouth sores or tenderness. Lungs: No shortness of breath or cough.  No hemoptysis. Cardiac:  No chest pain, palpitations, orthopnea, or PND. GI:  No nausea, vomiting, diarrhea, constipation, melena or hematochezia. GU:  No urgency, frequency, dysuria, or hematuria. Musculoskeletal:  Right hip and knee pain.  No muscle tenderness. Extremities:  No pain or swelling. Skin:  No rashes or skin changes. Neuro:  No headache, numbness or weakness, balance or coordination issues. Endocrine:  Hot flashes and night sweats (see HPI).  No diabetes, thyroid issues. Psych:  No mood changes, depression or anxiety. Pain:  No focal pain. Review of systems:  All other systems reviewed and found to be negative.  Physical Exam: Blood pressure 132/89, pulse 80, temperature (!) 97.3 F (36.3 C), temperature source Tympanic, resp. rate 18, weight 165 lb 10.8 oz (75.1 kg). GENERAL:  Well developed, well nourished, woman sitting comfortably in the exam room in no acute distress. MENTAL STATUS:  Alert and oriented to person, place and time. HEAD:  Short styled strawberry blonde hair.  Normocephalic, atraumatic, face symmetric, no Cushingoid features. EYES:  Glasses.  Blue eyes.  Pupils  equal round and reactive to light and accomodation.  No conjunctivitis or scleral icterus. ENT:  Oropharynx clear without lesion.  Tongue normal. Mucous membranes moist.  RESPIRATORY:  Clear to auscultation without rales, wheezes or rhonchi. CARDIOVASCULAR:  Regular rate and rhythm without murmur, rub or gallop. BREAST:  Right sided reconstruction extending into axillae with midline horizontal well healed incision.  No erythema or nodularity.  Left breast without masses, skin changes or nipple discharge.  4 cm tender area of fullness above nipple s/p surgery (old).  Inferior small vertical incision with mild post-operative changes. ABDOMEN:  Soft, non-tender, with active bowel sounds, and no hepatosplenomegaly.  No masses. SKIN:  No rashes, ulcers or lesions. EXTREMITIES: No edema, no skin discoloration or tenderness.  No palpable cords. LYMPH NODES: No palpable cervical, supraclavicular, axillary or inguinal adenopathy  NEUROLOGICAL: Unremarkable.  PSYCH:  Appropriate.   Appointment on 12/03/2016  Component Date Value Ref Range Status  . WBC 12/03/2016 6.1  3.6 - 11.0 K/uL Final  . RBC 12/03/2016 4.61  3.80 - 5.20 MIL/uL Final  . Hemoglobin 12/03/2016 14.4  12.0 - 16.0 g/dL Final  . HCT 12/03/2016 42.4  35.0 - 47.0 % Final  . MCV 12/03/2016 91.9  80.0 - 100.0 fL Final  . MCH 12/03/2016 31.3  26.0 - 34.0 pg Final  . MCHC 12/03/2016 34.0  32.0 - 36.0 g/dL Final  . RDW 12/03/2016 13.8  11.5 - 14.5 % Final  . Platelets 12/03/2016 269  150 - 440 K/uL Final  . Neutrophils Relative % 12/03/2016 52  % Final  . Neutro Abs 12/03/2016 3.2  1.4 - 6.5 K/uL Final  . Lymphocytes Relative 12/03/2016 24  % Final  . Lymphs Abs 12/03/2016 1.4  1.0 - 3.6 K/uL Final  . Monocytes Relative 12/03/2016 16  % Final  . Monocytes Absolute 12/03/2016 1.0* 0.2 - 0.9 K/uL Final  . Eosinophils Relative 12/03/2016 7  % Final  . Eosinophils Absolute 12/03/2016 0.4  0 - 0.7 K/uL Final  . Basophils Relative 12/03/2016 1   % Final  . Basophils Absolute 12/03/2016 0.1  0 - 0.1 K/uL Final  . Sodium 12/03/2016 137  135 - 145 mmol/L Final  . Potassium 12/03/2016 4.1  3.5 - 5.1 mmol/L Final  . Chloride 12/03/2016 104  101 - 111 mmol/L Final  . CO2 12/03/2016 27  22 - 32 mmol/L Final  . Glucose, Bld 12/03/2016 81  65 - 99 mg/dL Final  . BUN 12/03/2016 18  6 - 20 mg/dL Final  . Creatinine, Ser 12/03/2016 0.85  0.44 - 1.00 mg/dL Final  . Calcium 12/03/2016 8.8* 8.9 - 10.3 mg/dL Final  . Total Protein 12/03/2016 7.6  6.5 - 8.1 g/dL Final  . Albumin 12/03/2016 3.9  3.5 - 5.0 g/dL Final  . AST 12/03/2016 24  15 - 41 U/L Final  . ALT 12/03/2016 18  14 - 54 U/L Final  . Alkaline Phosphatase 12/03/2016 52  38 - 126 U/L Final  . Total Bilirubin 12/03/2016 0.5  0.3 - 1.2 mg/dL Final  . GFR calc non Af Amer 12/03/2016 >60  >60 mL/min Final  . GFR calc Af Amer 12/03/2016 >60  >60 mL/min Final   Comment: (NOTE) The eGFR has been calculated using the CKD EPI equation. This calculation has not been validated in all clinical situations. eGFR's persistently <60 mL/min signify possible Chronic Kidney Disease.   . Anion gap 12/03/2016 6  5 - 15 Final    Assessment:  [name] is a 66 y.o. female with a stage IA right breast cancer status post mastectomy on 02/24/2013.  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 Sentinel lymph node was negative. Tumor was ER/PR positive and HER-2/neu negative.  She underwent right sided breast reconstruction and left sided breast reduction on 01/18/2015.  Left sided mammogram on 11/06/2014 revealed no evidence of malignancy.  Left sided diagnostic mammogram on 05/19/2016 revealed probable benign calcifications in the left breast from fat necrosis.   Diagnostic mammogram on 11/24/2016 revealed no suspicious masses or concerning for malignancy in the left breast. There was a large area of evolving fat necrosis noted in the upper central and medial left breast.   She has been on  aromatase inhibitors since 03/2013. She was initially on Femara, then Arimidex, but discontinued secondary to hot flashes and joint pain.  Currently she is tolerating Aromasin.  She continues to have joint pain, hot flashes, and night sweats.  Occasional Aleve relieves joint pain.    CA27.29 has been followed: 22.6 on 09/20/2014, 25.3 on 02/28/2015, 20.8 on 10/03/2015, and 22.6 on 05/28/2016.  Bone density study on 06/06/2014 was normal with a T score of -0.9 in the L1-L4 region and -0.7 in the right femoral neck (normal). She continues to take daily calcium and vitamin D supplements.  She had a history of an abnormal PAP smear and is status post hysterectomy. 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 BRCA 1 and 2 testing was negative.   Symptomatically, she is doing well.  She has some hot flashes and night sweats.  She declines Effexor.  Exam reveals stable post-operative changes.  Plan: 1. Labs today:  CBC with diff, CMP, CA27.29. 2. Review mammogram-no evidence of malignancy. There was a large area of fat necrosis. 3.   Continue Aromasin. 4.   Schedule bone density study on 12/10/2016. 5.   Discuss bone density. Will consider Prolia after follow-up density study. Printed information on discharge AVS. Will need dental clearance.  6.   Preauth Prolia.  7.   Continue calcium and vitamin D. 8.   RTC in 6 months for MD assessment and labs (CBC with diff, CMP, CA27.29).   Honor Loh, NP  12/03/2016, 11:14 AM   I saw and evaluated the patient, participating in the key portions of the service and reviewing pertinent diagnostic studies and records.  I reviewed the nurse practitioner's note and agree with the findings and the plan.  The assessment and plan were discussed with the patient.  Additional diagnostic bone density study are needed to clarify bone density and would change the clinical management.  A few questions were asked by the patient and  answered.   Lequita Asal, MD 12/03/2016,3:48 PM

## 2016-12-03 ENCOUNTER — Inpatient Hospital Stay: Payer: 59 | Attending: Hematology and Oncology | Admitting: Hematology and Oncology

## 2016-12-03 ENCOUNTER — Other Ambulatory Visit: Payer: Self-pay | Admitting: *Deleted

## 2016-12-03 ENCOUNTER — Inpatient Hospital Stay: Payer: 59

## 2016-12-03 ENCOUNTER — Encounter: Payer: Self-pay | Admitting: Hematology and Oncology

## 2016-12-03 VITALS — BP 132/89 | HR 80 | Temp 97.3°F | Resp 18 | Wt 165.7 lb

## 2016-12-03 DIAGNOSIS — R232 Flushing: Secondary | ICD-10-CM | POA: Insufficient documentation

## 2016-12-03 DIAGNOSIS — Z9011 Acquired absence of right breast and nipple: Secondary | ICD-10-CM | POA: Diagnosis not present

## 2016-12-03 DIAGNOSIS — Z853 Personal history of malignant neoplasm of breast: Secondary | ICD-10-CM

## 2016-12-03 DIAGNOSIS — Z803 Family history of malignant neoplasm of breast: Secondary | ICD-10-CM | POA: Insufficient documentation

## 2016-12-03 DIAGNOSIS — Z79899 Other long term (current) drug therapy: Secondary | ICD-10-CM | POA: Diagnosis not present

## 2016-12-03 DIAGNOSIS — Z170 Estrogen receptor positive status [ER+]: Secondary | ICD-10-CM | POA: Insufficient documentation

## 2016-12-03 DIAGNOSIS — R61 Generalized hyperhidrosis: Secondary | ICD-10-CM | POA: Insufficient documentation

## 2016-12-03 DIAGNOSIS — Z79811 Long term (current) use of aromatase inhibitors: Secondary | ICD-10-CM | POA: Insufficient documentation

## 2016-12-03 DIAGNOSIS — M791 Myalgia: Secondary | ICD-10-CM | POA: Diagnosis not present

## 2016-12-03 DIAGNOSIS — C50911 Malignant neoplasm of unspecified site of right female breast: Secondary | ICD-10-CM | POA: Diagnosis not present

## 2016-12-03 DIAGNOSIS — G629 Polyneuropathy, unspecified: Secondary | ICD-10-CM | POA: Insufficient documentation

## 2016-12-03 DIAGNOSIS — M8580 Other specified disorders of bone density and structure, unspecified site: Secondary | ICD-10-CM

## 2016-12-03 LAB — CBC WITH DIFFERENTIAL/PLATELET
Basophils Absolute: 0.1 10*3/uL (ref 0–0.1)
Basophils Relative: 1 %
Eosinophils Absolute: 0.4 10*3/uL (ref 0–0.7)
Eosinophils Relative: 7 %
HCT: 42.4 % (ref 35.0–47.0)
Hemoglobin: 14.4 g/dL (ref 12.0–16.0)
Lymphocytes Relative: 24 %
Lymphs Abs: 1.4 10*3/uL (ref 1.0–3.6)
MCH: 31.3 pg (ref 26.0–34.0)
MCHC: 34 g/dL (ref 32.0–36.0)
MCV: 91.9 fL (ref 80.0–100.0)
Monocytes Absolute: 1 10*3/uL — ABNORMAL HIGH (ref 0.2–0.9)
Monocytes Relative: 16 %
Neutro Abs: 3.2 10*3/uL (ref 1.4–6.5)
Neutrophils Relative %: 52 %
Platelets: 269 10*3/uL (ref 150–440)
RBC: 4.61 MIL/uL (ref 3.80–5.20)
RDW: 13.8 % (ref 11.5–14.5)
WBC: 6.1 10*3/uL (ref 3.6–11.0)

## 2016-12-03 LAB — COMPREHENSIVE METABOLIC PANEL
ALT: 18 U/L (ref 14–54)
AST: 24 U/L (ref 15–41)
Albumin: 3.9 g/dL (ref 3.5–5.0)
Alkaline Phosphatase: 52 U/L (ref 38–126)
Anion gap: 6 (ref 5–15)
BUN: 18 mg/dL (ref 6–20)
CO2: 27 mmol/L (ref 22–32)
Calcium: 8.8 mg/dL — ABNORMAL LOW (ref 8.9–10.3)
Chloride: 104 mmol/L (ref 101–111)
Creatinine, Ser: 0.85 mg/dL (ref 0.44–1.00)
GFR calc Af Amer: >60 mL/min (ref >60)
GFR calc non Af Amer: >60 mL/min (ref >60)
Glucose, Bld: 81 mg/dL (ref 65–99)
Potassium: 4.1 mmol/L (ref 3.5–5.1)
Sodium: 137 mmol/L (ref 135–145)
Total Bilirubin: 0.5 mg/dL (ref 0.3–1.2)
Total Protein: 7.6 g/dL (ref 6.5–8.1)

## 2016-12-03 NOTE — Progress Notes (Signed)
Here for follow up. Stated oing well but stated hot flashes continue. Did not start Effexor.

## 2016-12-03 NOTE — Patient Instructions (Signed)

## 2016-12-04 LAB — CA 27.29 (SERIAL MONITOR): CA 27.29: 22.4 U/mL (ref 0.0–38.6)

## 2017-01-14 ENCOUNTER — Other Ambulatory Visit: Payer: 59

## 2017-01-14 ENCOUNTER — Ambulatory Visit
Admission: RE | Admit: 2017-01-14 | Discharge: 2017-01-14 | Disposition: A | Discharge status: Home / Self Care | Payer: 59 | Source: Ambulatory Visit | Attending: Urgent Care | Admitting: Urgent Care

## 2017-01-14 DIAGNOSIS — C50911 Malignant neoplasm of unspecified site of right female breast: Secondary | ICD-10-CM | POA: Insufficient documentation

## 2017-01-14 DIAGNOSIS — M8580 Other specified disorders of bone density and structure, unspecified site: Secondary | ICD-10-CM | POA: Insufficient documentation

## 2017-01-14 DIAGNOSIS — Z780 Asymptomatic menopausal state: Secondary | ICD-10-CM | POA: Diagnosis not present

## 2017-01-14 DIAGNOSIS — Z13820 Encounter for screening for osteoporosis: Secondary | ICD-10-CM | POA: Diagnosis not present

## 2017-02-02 DIAGNOSIS — H5213 Myopia, bilateral: Secondary | ICD-10-CM | POA: Diagnosis not present

## 2017-03-12 ENCOUNTER — Other Ambulatory Visit: Payer: Self-pay | Admitting: Hematology and Oncology

## 2017-06-03 ENCOUNTER — Ambulatory Visit: Payer: 59 | Admitting: Hematology and Oncology

## 2017-06-03 ENCOUNTER — Other Ambulatory Visit: Payer: 59

## 2017-07-16 ENCOUNTER — Ambulatory Visit (INDEPENDENT_AMBULATORY_CARE_PROVIDER_SITE_OTHER): Payer: 59 | Admitting: Internal Medicine

## 2017-07-16 ENCOUNTER — Encounter: Payer: Self-pay | Admitting: Internal Medicine

## 2017-07-16 VITALS — BP 132/82 | HR 77 | Resp 16 | Ht 66.0 in | Wt 164.0 lb

## 2017-07-16 DIAGNOSIS — E785 Hyperlipidemia, unspecified: Secondary | ICD-10-CM | POA: Diagnosis not present

## 2017-07-16 DIAGNOSIS — C50911 Malignant neoplasm of unspecified site of right female breast: Secondary | ICD-10-CM

## 2017-07-16 DIAGNOSIS — Z1159 Encounter for screening for other viral diseases: Secondary | ICD-10-CM

## 2017-07-16 DIAGNOSIS — K219 Gastro-esophageal reflux disease without esophagitis: Secondary | ICD-10-CM

## 2017-07-16 DIAGNOSIS — Z23 Encounter for immunization: Secondary | ICD-10-CM | POA: Diagnosis not present

## 2017-07-16 DIAGNOSIS — Z0000 Encounter for general adult medical examination without abnormal findings: Secondary | ICD-10-CM

## 2017-07-16 LAB — POCT URINALYSIS DIPSTICK
BILIRUBIN UA: NEGATIVE
Blood, UA: NEGATIVE
GLUCOSE UA: NEGATIVE
KETONES UA: NEGATIVE
Leukocytes, UA: NEGATIVE
Nitrite, UA: NEGATIVE
Protein, UA: NEGATIVE
Spec Grav, UA: 1.015 (ref 1.010–1.025)
Urobilinogen, UA: 0.2 E.U./dL
pH, UA: 6.5 (ref 5.0–8.0)

## 2017-07-16 MED ORDER — ZOSTER VAC RECOMB ADJUVANTED 50 MCG/0.5ML IM SUSR: 0.5000 mL | Freq: Once | INTRAMUSCULAR | 1 refills | Status: AC

## 2017-07-16 NOTE — Progress Notes (Signed)
Date:  07/16/2017   Name:  [name]   DOB:  09-17-1950   MRN:  [id number]   Chief Complaint: Annual Exam [name] is a 67 y.o. female who presents today for her Complete Annual Exam. She feels well. She reports exercising none but on her feet at work. She reports she is sleeping well.  She is followed by Oncology for breast cancer.  Has about 8 more months of AI therapy. She is due for Tdap and Hep C testing. She would like get Shingrix.   Review of Systems  Constitutional: Negative for chills, fatigue and fever.  HENT: Negative for congestion, hearing loss, tinnitus, trouble swallowing and voice change.   Eyes: Negative for visual disturbance.  Respiratory: Negative for cough, chest tightness, shortness of breath and wheezing.   Cardiovascular: Negative for chest pain, palpitations and leg swelling.  Gastrointestinal: Negative for abdominal pain, constipation, diarrhea and vomiting.       [name] unless she takes nexium  Endocrine: Negative for polydipsia and polyuria.  Genitourinary: Negative for dysuria, frequency, genital sores, vaginal bleeding and vaginal discharge.  Musculoskeletal: Negative for arthralgias, gait problem and joint swelling.  Skin: Negative for color change and rash.  Neurological: Negative for dizziness, tremors, light-headedness and headaches.  Hematological: Negative for adenopathy. Does not bruise/bleed easily.  Psychiatric/Behavioral: Negative for dysphoric mood and sleep disturbance. The patient is not nervous/anxious.     Patient Active Problem List   Diagnosis Date Noted  . Hot flashes 05/28/2016  . Environmental and seasonal allergies 12/18/2014  . Carcinoma of vulva ([place]) 12/18/2014  . Alopecia 12/18/2014  . Edema extremities 12/18/2014  . Acid reflux 12/18/2014  . HBV (hepatitis B virus) infection 12/18/2014  . Malignant neoplasm of breast ([place]) 12/18/2014  . Hyperlipidemia, mild 12/18/2014    Prior to Admission medications     Medication Sig Start Date End Date Taking? Authorizing Provider  Calcium Carbonate-Vitamin D (CALCIUM + D PO) Take by mouth.    Yes [provider]  CINNAMON PO Take by mouth.   Yes [provider]  exemestane (AROMASIN) 25 MG tablet TAKE 1 TABLET (25 MG TOTAL) BY MOUTH DAILY AFTER BREAKFAST. 03/12/17  Yes Corcoran, Drue Second, MD  fexofenadine (ALLEGRA) 180 MG tablet Take 1 tablet by mouth daily. 03/01/14  Yes [provider]  glucosamine-chondroitin 500-400 MG tablet Take 1 tablet by mouth 2 (two) times daily.   Yes [provider]  Magnesium 500 MG TABS Take by mouth.   Yes [provider]  Melatonin 5 MG TABS Take 5 mg by mouth at bedtime as needed.   Yes [provider]  MULTIPLE VITAMIN PO Take 1 tablet by mouth daily.   Yes [provider]  Omega 3 1000 MG CAPS Take 1,000 mg by mouth 2 (two) times daily.   Yes [provider]  Probiotic Product (PROBIOTIC DAILY PO) Take 10 mg by mouth 2 (two) times daily.   Yes [provider]  triamcinolone (NASACORT ALLERGY 24HR) 55 MCG/ACT AERO nasal inhaler Place into the nose. 03/29/14  Yes [provider]  Turmeric Curcumin 500 MG CAPS Take 2 tablets by mouth daily.    Yes [provider]    Allergies  Allergen Reactions  . Penicillins Rash  . Sulfa Antibiotics Rash    Past Surgical History:  Procedure Laterality Date  . ABDOMINAL HYSTERECTOMY    . AUGMENTATION MAMMAPLASTY Right 83/1517   Silicone  . BREAST BIOPSY Right  Positive  . BREAST REDUCTION SURGERY Left 01/18/2015   Procedure: MAMMARY REDUCTION  (BREAST);  Surgeon: Nicholaus Bloom, MD;  Location: [facility];  Service: Plastics;  Laterality: Left;  . REDUCTION MAMMAPLASTY Left 01/2015  . TISSUE EXPANDER PLACEMENT Right 01/18/2015   Procedure: Right  BREAST EXPANDER PLACEMENT WITH ALLODERM;  Surgeon: Nicholaus Bloom, MD;  Location: [facility];  Service: Plastics;  Laterality:  Right;  DR COAN RM 2 PER BRENDA  . TISSUE EXPANDER PLACEMENT Right 03/13/2015   Procedure: Right breast tissue expander removal and Sillicone implant placement.;  Surgeon: Nicholaus Bloom, MD;  Location: [facility];  Service: Plastics;  Laterality: Right;  . TOTAL MASTECTOMY Right 02/2013   stage 1 ER/PR+  . VULVECTOMY      Social History   Tobacco Use  . Smoking status: Never Smoker  . Smokeless tobacco: Never Used  Substance Use Topics  . Alcohol use: Yes    Alcohol/week: 1.8 oz    Types: 3 Glasses of wine per week  . Drug use: No     Medication list has been reviewed and updated.  PHQ 2/9 Scores 07/16/2017 08/31/2015 08/30/2015  PHQ - 2 Score 0 0 0    Physical Exam  Constitutional: She is oriented to person, place, and time. She appears well-developed and well-nourished. No distress.  HENT:  Head: Normocephalic and atraumatic.  Right Ear: Tympanic membrane and ear canal normal.  Left Ear: Tympanic membrane and ear canal normal.  Nose: Right sinus exhibits no maxillary sinus tenderness. Left sinus exhibits no maxillary sinus tenderness.  Mouth/Throat: Uvula is midline and oropharynx is clear and moist.  Eyes: Conjunctivae and EOM are normal. Right eye exhibits no discharge. Left eye exhibits no discharge. No scleral icterus.  Neck: Normal range of motion. Carotid bruit is not present. No erythema present. No thyromegaly present.  Cardiovascular: Normal rate, regular rhythm, normal heart sounds and normal pulses.  Pulmonary/Chest: Effort normal. No respiratory distress. She has no wheezes.  Reconstructed right breast with implant shifted to axilla - no evidence of mass or skin recurrence  Left breast reduction with nodular changed superior breast, skin intact with no changes  Abdominal: Soft. Bowel sounds are normal. There is no hepatosplenomegaly. There is no tenderness. There is no CVA tenderness.  Musculoskeletal: Normal range of motion.  Lymphadenopathy:    She has  no cervical adenopathy.    She has no axillary adenopathy.  Neurological: She is alert and oriented to person, place, and time. She has normal reflexes. No cranial nerve deficit or sensory deficit.  Skin: Skin is warm, dry and intact. No rash noted.  Psychiatric: She has a normal mood and affect. Her speech is normal and behavior is normal. Thought content normal.  Nursing note and vitals reviewed.   BP 132/82   Pulse 77   Resp 16   Ht 5\' 6"  (1.676 m)   Wt 164 lb (74.4 kg)   BMI 26.47 kg/m   Assessment and Plan: 1. Annual physical exam Recommend regular exercise - TSH - POCT urinalysis dipstick  2. Gastroesophageal reflux disease, esophagitis presence not specified Stable, controlled on PPI - CBC with Differential/Platelet  3. Hyperlipidemia, mild Check labs - Comprehensive metabolic panel - Lipid panel  4. Malignant neoplasm of right female breast, unspecified estrogen receptor status, unspecified site of breast ([place]) Followed by Oncology - Cancer antigen 27.29  5. Need for hepatitis C screening test - Hepatitis C antibody  6. Need for shingles vaccine - Zoster Vaccine  Adjuvanted [facility]) injection; Inject 0.5 mLs into the muscle once for 1 dose.  Dispense: 0.5 mL; Refill: 1  7. Need for diphtheria-tetanus-pertussis (Tdap) vaccine - Tdap vaccine greater than or equal to 7yo IM   Meds ordered this encounter  Medications  . Zoster Vaccine Adjuvanted [facility]) injection    Sig: Inject 0.5 mLs into the muscle once for 1 dose.    Dispense:  0.5 mL    Refill:  1    Partially dictated using Editor, commissioning. Any errors are unintentional.  Halina Maidens, MD [facility]  07/16/2017

## 2017-07-16 NOTE — Patient Instructions (Addendum)
Tdap Vaccine (Tetanus, Diphtheria and Pertussis): What You Need to Know 1. Why get vaccinated? Tetanus, diphtheria and pertussis are very serious diseases. Tdap vaccine can protect [place] from these diseases. And, Tdap vaccine given to pregnant women can protect newborn babies against pertussis. TETANUS (Lockjaw) is rare in the [place] today. It causes painful muscle tightening and stiffness, usually all over the body.  It can lead to tightening of muscles in the head and neck so you can't open your mouth, swallow, or sometimes even breathe. Tetanus kills about 1 out of 10 people who are infected even after receiving the best medical care.  DIPHTHERIA is also rare in the [place] today. It can cause a thick coating to form in the back of the throat.  It can lead to breathing problems, heart failure, paralysis, and death.  PERTUSSIS (Whooping Cough) causes severe coughing spells, which can cause difficulty breathing, vomiting and disturbed sleep.  It can also lead to weight loss, incontinence, and rib fractures. Up to 2 in 100 adolescents and 5 in 100 adults with pertussis are hospitalized or have complications, which could include pneumonia or death.  These diseases are caused by bacteria. Diphtheria and pertussis are spread from person to person through secretions from coughing or sneezing. Tetanus enters the body through cuts, scratches, or wounds. Before vaccines, as many as 200,000 cases of diphtheria, 200,000 cases of pertussis, and hundreds of cases of tetanus, were reported in the [place] each year. Since vaccination began, reports of cases for tetanus and diphtheria have dropped by about 99% and for pertussis by about 80%. 2. Tdap vaccine Tdap vaccine can protect adolescents and adults from tetanus, diphtheria, and pertussis. One dose of Tdap is routinely given at age 8 or 17. People who did not get Tdap at that age should get it as soon as possible. Tdap is especially  important for healthcare professionals and anyone having close contact with a baby younger than 12 months. Pregnant women should get a dose of Tdap during every pregnancy, to protect the newborn from pertussis. Infants are most at risk for severe, life-threatening complications from pertussis. Another vaccine, called Td, protects against tetanus and diphtheria, but not pertussis. A Td booster should be given every 10 years. Tdap may be given as one of these boosters if you have never gotten Tdap before. Tdap may also be given after a severe cut or burn to prevent tetanus infection. Your doctor or the person giving you the vaccine can give you more information. Tdap may safely be given at the same time as other vaccines. 3. Some people should not get this vaccine  A person who has ever had a life-threatening allergic reaction after a previous dose of any diphtheria, tetanus or pertussis containing vaccine, OR has a severe allergy to any part of this vaccine, should not get Tdap vaccine. Tell the person giving the vaccine about any severe allergies.  Anyone who had coma or long repeated seizures within 7 days after a childhood dose of DTP or DTaP, or a previous dose of Tdap, should not get Tdap, unless a cause other than the vaccine was found. They can still get Td.  Talk to your doctor if you: ? have seizures or another nervous system problem, ? had severe pain or swelling after any vaccine containing diphtheria, tetanus or pertussis, ? ever had a condition called Guillain-Barr Syndrome (GBS), ? aren't feeling well on the day the shot is scheduled. 4. Risks With any medicine, including  vaccines, there is a chance of side effects. These are usually mild and go away on their own. Serious reactions are also possible but are rare. Most people who get Tdap vaccine do not have any problems with it. Mild problems following Tdap: (Did not interfere with activities)  Pain where the shot was given (about  3 in 4 adolescents or 2 in 3 adults)  Redness or swelling where the shot was given (about 1 person in 5)  Mild fever of at least 100.23F (up to about 1 in 25 adolescents or 1 in 100 adults)  Headache (about 3 or 4 people in 10)  Tiredness (about 1 person in 3 or 4)  Nausea, vomiting, diarrhea, stomach ache (up to 1 in 4 adolescents or 1 in 10 adults)  Chills, sore joints (about 1 person in 10)  Body aches (about 1 person in 3 or 4)  Rash, swollen glands (uncommon)  Moderate problems following Tdap: (Interfered with activities, but did not require medical attention)  Pain where the shot was given (up to 1 in 5 or 6)  Redness or swelling where the shot was given (up to about 1 in 16 adolescents or 1 in 12 adults)  Fever over 102F (about 1 in 100 adolescents or 1 in 250 adults)  Headache (about 1 in 7 adolescents or 1 in 10 adults)  Nausea, vomiting, diarrhea, stomach ache (up to 1 or 3 people in 100)  Swelling of the entire arm where the shot was given (up to about 1 in 500).  Severe problems following Tdap: (Unable to perform usual activities; required medical attention)  Swelling, severe pain, bleeding and redness in the arm where the shot was given (rare).  Problems that could happen after any vaccine:  People sometimes faint after a medical procedure, including vaccination. Sitting or lying down for about 15 minutes can help prevent fainting, and injuries caused by a fall. Tell your doctor if you feel dizzy, or have vision changes or ringing in the ears.  Some people get severe pain in the shoulder and have difficulty moving the arm where a shot was given. This happens very rarely.  Any medication can cause a severe allergic reaction. Such reactions from a vaccine are very rare, estimated at fewer than 1 in a million doses, and would happen within a few minutes to a few hours after the vaccination. As with any medicine, there is a very remote chance of a vaccine  causing a serious injury or death. The safety of vaccines is always being monitored. For more information, visit: http://www.aguilar.org/ 5. What if there is a serious problem? What should I look for? Look for anything that concerns you, such as signs of a severe allergic reaction, very high fever, or unusual behavior. Signs of a severe allergic reaction can include hives, swelling of the face and throat, difficulty breathing, a fast heartbeat, dizziness, and weakness. These would usually start a few minutes to a few hours after the vaccination. What should I do?  If you think it is a severe allergic reaction or other emergency that can't wait, call 9-1-1 or get the person to the nearest hospital. Otherwise, call your doctor.  Afterward, the reaction should be reported to the Vaccine Adverse Event Reporting System (VAERS). Your doctor might file this report, or you can do it yourself through the VAERS web site at www.vaers.SamedayNews.es, or by calling 908 647 5455. ? VAERS does not give medical advice. 6. The National Vacc[employer]  Vaccine Injury Compensation Program (VICP) is a federal program that was created to compensate people who may have been injured by certain vaccines. Persons who believe they may have been injured by a vaccine can learn about the program and about filing a claim by calling 561-804-6615 or visiting the [place] website at GoldCloset.com.ee. There is a time limit to file a claim for compensation. 7. How can I learn more?  Ask your doctor. He or she can give you the vaccine package insert or suggest other sources of information.  Call your local or state health department.  Contact the Centers for Disease Control and Prevention (CDC): ? Call 424-328-3040 (1-800-CDC-INFO) or ? Visit CDC's website at http://hunter.com/ CDC Tdap Vaccine VIS (05/10/13) This information is not intended to replace advice given to you by your  health care provider. Make sure you discuss any questions you have with your health care provider. Document Released: 09/02/2011 Document Revised: 11/22/2015 Document Reviewed: 11/22/2015 Elsevier Interactive Patient Education  2017 [place] Maintenance for Postmenopausal Women Menopause is a normal process in which your reproductive ability comes to an end. This process happens gradually over a span of months to years, usually between the ages of 7 and 80. Menopause is complete when you have missed 12 consecutive menstrual periods. It is important to talk with your health care provider about some of the most common conditions that affect postmenopausal women, such as heart disease, cancer, and bone loss (osteoporosis). Adopting a healthy lifestyle and getting preventive care can help to promote your health and wellness. Those actions can also lower your chances of developing some of these common conditions. What should I know about menopause? During menopause, you may experience a number of symptoms, such as:  Moderate-to-severe hot flashes.  Night sweats.  Decrease in sex drive.  Mood swings.  Headaches.  Tiredness.  Irritability.  Memory problems.  Insomnia.  Choosing to treat or not to treat menopausal changes is an individual decision that you make with your health care provider. What should I know about hormone replacement therapy and supplements? Hormone therapy products are effective for treating symptoms that are associated with menopause, such as hot flashes and night sweats. Hormone replacement carries certain risks, especially as you become older. If you are thinking about using estrogen or estrogen with progestin treatments, discuss the benefits and risks with your health care provider. What should I know about heart disease and stroke? Heart disease, heart attack, and stroke become more likely as you age. This may be due, in part, to the hormonal changes  that your body experiences during menopause. These can affect how your body processes dietary fats, triglycerides, and cholesterol. Heart attack and stroke are both medical emergencies. There are many things that you can do to help prevent heart disease and stroke:  Have your blood pressure checked at least every 1-2 years. High blood pressure causes heart disease and increases the risk of stroke.  If you are 1-29 years old, ask your health care provider if you should take aspirin to prevent a heart attack or a stroke.  Do not use any tobacco products, including cigarettes, chewing tobacco, or electronic cigarettes. If you need help quitting, ask your health care provider.  It is important to eat a healthy diet and maintain a healthy weight. ? Be sure to include plenty of vegetables, fruits, low-fat dairy products, and lean protein. ? Avoid eating foods that are high in solid fats, added sugars, or salt (sodium).  Get  regular exercise. This is one of the most important things that you can do for your health. ? Try to exercise for at least 150 minutes each week. The type of exercise that you do should increase your heart rate and make you sweat. This is known as moderate-intensity exercise. ? Try to do strengthening exercises at least twice each week. Do these in addition to the moderate-intensity exercise.  Know your numbers.Ask your health care provider to check your cholesterol and your blood glucose. Continue to have your blood tested as directed by your health care provider.  What should I know about cancer screening? There are several types of cancer. Take the following steps to reduce your risk and to catch any cancer development as early as possible. Breast Cancer  Practice breast self-awareness. ? This means understanding how your breasts normally appear and feel. ? It also means doing regular breast self-exams. Let your health care provider know about any changes, no matter how  small.  If you are 75 or older, have a clinician do a breast exam (clinical breast exam or CBE) every year. Depending on your age, family history, and medical history, it may be recommended that you also have a yearly breast X-ray (mammogram).  If you have a family history of breast cancer, talk with your health care provider about genetic screening.  If you are at high risk for breast cancer, talk with your health care provider about having an MRI and a mammogram every year.  Breast cancer (BRCA) gene test is recommended for women who have family members with BRCA-related cancers. Results of the assessment will determine the need for genetic counseling and BRCA1 and for BRCA2 testing. BRCA-related cancers include these types: ? Breast. This occurs in males or females. ? Ovarian. ? Tubal. This may also be called fallopian tube cancer. ? Cancer of the abdominal or pelvic lining (peritoneal cancer). ? Prostate. ? Pancreatic.  Cervical, Uterine, and Ovarian Cancer Your health care provider may recommend that you be screened regularly for cancer of the pelvic organs. These include your ovaries, uterus, and vagina. This screening involves a pelvic exam, which includes checking for microscopic changes to the surface of your cervix (Pap test).  For women ages 21-65, health care providers may recommend a pelvic exam and a Pap test every three years. For women ages 66-65, they may recommend the Pap test and pelvic exam, combined with testing for human papilloma virus (HPV), every five years. Some types of HPV increase your risk of cervical cancer. Testing for HPV may also be done on women of any age who have unclear Pap test results.  Other health care providers may not recommend any screening for nonpregnant women who are considered low risk for pelvic cancer and have no symptoms. Ask your health care provider if a screening pelvic exam is right for you.  If you have had past treatment for cervical  cancer or a condition that could lead to cancer, you need Pap tests and screening for cancer for at least 20 years after your treatment. If Pap tests have been discontinued for you, your risk factors (such as having a new sexual partner) need to be reassessed to determine if you should start having screenings again. Some women have medical problems that increase the chance of getting cervical cancer. In these cases, your health care provider may recommend that you have screening and Pap tests more often.  If you have a family history of uterine cancer or ovarian cancer,  talk with your health care provider about genetic screening.  If you have vaginal bleeding after reaching menopause, tell your health care provider.  There are currently no reliable tests available to screen for ovarian cancer.  Lung Cancer Lung cancer screening is recommended for adults 24-47 years old who are at high risk for lung cancer because of a history of smoking. A yearly low-dose CT scan of the lungs is recommended if you:  Currently smoke.  Have a history of at least 30 pack-years of smoking and you currently smoke or have quit within the past 15 years. A pack-year is smoking an average of one pack of cigarettes per day for one year.  Yearly screening should:  Continue until it has been 15 years since you quit.  Stop if you develop a health problem that would prevent you from having lung cancer treatment.  Colorectal Cancer  This type of cancer can be detected and can often be prevented.  Routine colorectal cancer screening usually begins at age 66 and continues through age 34.  If you have risk factors for colon cancer, your health care provider may recommend that you be screened at an earlier age.  If you have a family history of colorectal cancer, talk with your health care provider about genetic screening.  Your health care provider may also recommend using home test kits to check for hidden blood in your  stool.  A small camera at the end of a tube can be used to examine your colon directly (sigmoidoscopy or colonoscopy). This is done to check for the earliest forms of colorectal cancer.  Direct examination of the colon should be repeated every 5-10 years until age 70. However, if early forms of precancerous polyps or small growths are found or if you have a family history or genetic risk for colorectal cancer, you may need to be screened more often.  Skin Cancer  Check your skin from head to toe regularly.  Monitor any moles. Be sure to tell your health care provider: ? About any new moles or changes in moles, especially if there is a change in a mole's shape or color. ? If you have a mole that is larger than the size of a pencil eraser.  If any of your family members has a history of skin cancer, especially at a young age, talk with your health care provider about genetic screening.  Always use sunscreen. Apply sunscreen liberally and repeatedly throughout the day.  Whenever you are outside, protect yourself by wearing long sleeves, pants, a wide-brimmed hat, and sunglasses.  What should I know about osteoporosis? Osteoporosis is a condition in which bone destruction happens more quickly than new bone creation. After menopause, you may be at an increased risk for osteoporosis. To help prevent osteoporosis or the bone fractures that can happen because of osteoporosis, the following is recommended:  If you are 45-64 years old, get at least 1,000 mg of calcium and at least 600 mg of vitamin D per day.  If you are older than age 44 but younger than age 46, get at least 1,200 mg of calcium and at least 600 mg of vitamin D per day.  If you are older than age 23, get at least 1,200 mg of calcium and at least 800 mg of vitamin D per day.  Smoking and excessive alcohol intake increase the risk of osteoporosis. Eat foods that are rich in calcium and vitamin D, and do weight-bearing exercises  several times each  week as directed by your health care provider. What should I know about how menopause affects my mental health? Depression may occur at any age, but it is more common as you become older. Common symptoms of depression include:  Low or sad mood.  Changes in sleep patterns.  Changes in appetite or eating patterns.  Feeling an overall lack of motivation or enjoyment of activities that you previously enjoyed.  Frequent crying spells.  Talk with your health care provider if you think that you are experiencing depression. What should I know about immunizations? It is important that you get and maintain your immunizations. These include:  Tetanus, diphtheria, and pertussis (Tdap) booster vaccine.  Influenza every year before the flu season begins.  Pneumonia vaccine.  Shingles vaccine.  Your health care provider may also recommend other immunizations. This information is not intended to replace advice given to you by your health care provider. Make sure you discuss any questions you have with your health care provider. Document Released: 04/25/2005 Document Revised: 09/21/2015 Document Reviewed: 12/05/2014 Elsevier Interactive Patient Education  2018 [employer].

## 2017-07-17 ENCOUNTER — Other Ambulatory Visit: Payer: Self-pay | Admitting: Internal Medicine

## 2017-07-17 DIAGNOSIS — R768 Other specified abnormal immunological findings in serum: Secondary | ICD-10-CM

## 2017-07-17 LAB — COMPREHENSIVE METABOLIC PANEL
ALK PHOS: 66 IU/L (ref 39–117)
ALT: 24 IU/L (ref 0–32)
AST: 28 IU/L (ref 0–40)
Albumin/Globulin Ratio: 1.5 (ref 1.2–2.2)
Albumin: 4.3 g/dL (ref 3.6–4.8)
BUN/Creatinine Ratio: 15 (ref 12–28)
BUN: 13 mg/dL (ref 8–27)
Bilirubin Total: 0.4 mg/dL (ref 0.0–1.2)
CALCIUM: 9.6 mg/dL (ref 8.7–10.3)
CO2: 25 mmol/L (ref 20–29)
CREATININE: 0.87 mg/dL (ref 0.57–1.00)
Chloride: 102 mmol/L (ref 96–106)
GFR calc Af Amer: 81 mL/min/{1.73_m2} (ref >59)
GFR, EST NON AFRICAN AMERICAN: 71 mL/min/{1.73_m2} (ref >59)
Globulin, Total: 2.8 g/dL (ref 1.5–4.5)
Glucose: 81 mg/dL (ref 65–99)
POTASSIUM: 4.5 mmol/L (ref 3.5–5.2)
SODIUM: 142 mmol/L (ref 134–144)
Total Protein: 7.1 g/dL (ref 6.0–8.5)

## 2017-07-17 LAB — LIPID PANEL
CHOLESTEROL TOTAL: 243 mg/dL — AB (ref 100–199)
Chol/HDL Ratio: 4.3 ratio (ref 0.0–4.4)
HDL: 57 mg/dL (ref >39)
LDL CALC: 164 mg/dL — AB (ref 0–99)
TRIGLYCERIDES: 110 mg/dL (ref 0–149)
VLDL Cholesterol Cal: 22 mg/dL (ref 5–40)

## 2017-07-17 LAB — CBC WITH DIFFERENTIAL/PLATELET
BASOS ABS: 0 10*3/uL (ref 0.0–0.2)
Basos: 1 %
EOS (ABSOLUTE): 0.2 10*3/uL (ref 0.0–0.4)
EOS: 3 %
Hematocrit: 43.3 % (ref 34.0–46.6)
Hemoglobin: 14.1 g/dL (ref 11.1–15.9)
IMMATURE GRANULOCYTES: 0 %
Immature Grans (Abs): 0 10*3/uL (ref 0.0–0.1)
LYMPHS ABS: 1.8 10*3/uL (ref 0.7–3.1)
Lymphs: 30 %
MCH: 30.2 pg (ref 26.6–33.0)
MCHC: 32.6 g/dL (ref 31.5–35.7)
MCV: 93 fL (ref 79–97)
MONOS ABS: 0.7 10*3/uL (ref 0.1–0.9)
Monocytes: 11 %
NEUTROS PCT: 55 %
Neutrophils Absolute: 3.3 10*3/uL (ref 1.4–7.0)
PLATELETS: 310 10*3/uL (ref 150–379)
RBC: 4.67 x10E6/uL (ref 3.77–5.28)
RDW: 13.9 % (ref 12.3–15.4)
WBC: 6.1 10*3/uL (ref 3.4–10.8)

## 2017-07-17 LAB — CANCER ANTIGEN 27.29: CAN 27.29: 19.6 U/mL (ref 0.0–38.6)

## 2017-07-17 LAB — TSH: TSH: 2.06 u[IU]/mL (ref 0.450–4.500)

## 2017-07-17 LAB — HEPATITIS C ANTIBODY: HEP C VIRUS AB: 1 {s_co_ratio} — AB (ref 0.0–0.9)

## 2017-07-22 ENCOUNTER — Inpatient Hospital Stay: Payer: 59 | Attending: Hematology and Oncology | Admitting: Hematology and Oncology

## 2017-07-22 ENCOUNTER — Other Ambulatory Visit: Payer: 59

## 2017-07-22 ENCOUNTER — Encounter: Payer: Self-pay | Admitting: Hematology and Oncology

## 2017-07-22 VITALS — BP 133/85 | HR 88 | Temp 98.0°F | Resp 18 | Wt 169.1 lb

## 2017-07-22 DIAGNOSIS — R61 Generalized hyperhidrosis: Secondary | ICD-10-CM | POA: Insufficient documentation

## 2017-07-22 DIAGNOSIS — Z79811 Long term (current) use of aromatase inhibitors: Secondary | ICD-10-CM | POA: Insufficient documentation

## 2017-07-22 DIAGNOSIS — C50911 Malignant neoplasm of unspecified site of right female breast: Secondary | ICD-10-CM | POA: Insufficient documentation

## 2017-07-22 DIAGNOSIS — Z8589 Personal history of malignant neoplasm of other organs and systems: Secondary | ICD-10-CM | POA: Insufficient documentation

## 2017-07-22 DIAGNOSIS — Z170 Estrogen receptor positive status [ER+]: Secondary | ICD-10-CM | POA: Insufficient documentation

## 2017-07-22 DIAGNOSIS — N951 Menopausal and female climacteric states: Secondary | ICD-10-CM | POA: Insufficient documentation

## 2017-07-22 DIAGNOSIS — Z90710 Acquired absence of both cervix and uterus: Secondary | ICD-10-CM | POA: Diagnosis not present

## 2017-07-22 DIAGNOSIS — M25511 Pain in right shoulder: Secondary | ICD-10-CM | POA: Diagnosis not present

## 2017-07-22 NOTE — Progress Notes (Signed)
Patient here today for 6 month f/u regarding breast cancer. Patient offers no complaints today.

## 2017-07-22 NOTE — Progress Notes (Signed)
[facility] day:  07/22/17   Chief Complaint: [name] is a 67 y.o. female with a history of stage IA right breast cancer who is seen for 6 month assessment.    HPI: The patient was last seen in the medical oncology clinic on 12/03/2016.  At that time, she was doing well.  She had some hot flashes and night sweats.  She declined Effexor.  Exam revealed stable post-operative changes.  Bone density on 01/14/2017 was normal with a T-score of -0.7 in the AP spine L1-L4 and - 0.5 in the right femoral neck.  Labs on 07/16/2017 revealed a normal CBC with diff, CMP, and CA27.29.  TSH was normal.  Hepatitis C antibody was 1.0 (0-0.9).  During the interim, patient is doing well. She notes that her vasomotor symptoms are manageable. She has no acute complaints.  Patient does not verbalize any concerns with regards to her breasts today. She denies B symptoms and interval infections. Patient performs monthly self breast examinations as recommended.   Patient eating well. Weight is up 4 pounds. Patient continues on her Aromasin as prescribed.   Pain rated 8/10 (right shoulder) in clinic today.    Past Medical History:  Diagnosis Date  . Arthritis   . Breast cancer ([place]) 02/2014  . 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 Hepatitis    Hep B in 1981  . PONV (postoperative nausea and vomiting)    pt states scopalamine patch helped and zofran and phenergan helped    Past Surgical History:  Procedure Laterality Date  . ABDOMINAL HYSTERECTOMY    . AUGMENTATION MAMMAPLASTY Right 78/2956   Silicone  . BREAST BIOPSY Right    Positive  . BREAST REDUCTION SURGERY Left 01/18/2015   Procedure: MAMMARY REDUCTION  (BREAST);  Surgeon: Nicholaus Bloom, MD;  Location: [facility];  Service: Plastics;  Laterality: Left;  . REDUCTION MAMMAPLASTY Left 01/2015  . TISSUE EXPANDER PLACEMENT Right 01/18/2015   Procedure: Right  BREAST EXPANDER PLACEMENT WITH ALLODERM;   Surgeon: Nicholaus Bloom, MD;  Location: [facility];  Service: Plastics;  Laterality: Right;  DR COAN RM 2 PER BRENDA  . TISSUE EXPANDER PLACEMENT Right 03/13/2015   Procedure: Right breast tissue expander removal and Sillicone implant placement.;  Surgeon: Nicholaus Bloom, MD;  Location: [facility];  Service: Plastics;  Laterality: Right;  . TOTAL MASTECTOMY Right 02/2013   stage 1 ER/PR+  . VULVECTOMY      Family History  Problem Relation Age of Onset  . Hypertension Father   . Diabetes Father   . Stroke Father   . Diabetes Brother   . Hypertension Mother   . Thyroid disease Mother   . Leukemia Maternal Grandmother   . Breast cancer Paternal Grandmother   . Breast cancer Cousin     Social History:  reports that she has never smoked. She has never used smokeless tobacco. She reports that she drinks about 1.8 oz of alcohol per week. She reports that she does not use drugs.  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  She lives in [place].  The patient is alone today.  Allergies:  Allergies  Allergen Reactions  . Penicillins Rash  . Sulfa Antibiotics Rash    Current Medications: Current Outpatient Medications  Medication Sig Dispense Refill  . Calcium Carbonate-Vitamin D (CALCIUM + D PO) Take by mouth.     Marland Kitchen CINNAMON PO Take by mouth.    Marland Kitchen  esomeprazole (NEXIUM) 20 MG capsule Take 20 mg by mouth daily at 12 noon.    Marland Kitchen exemestane (AROMASIN) 25 MG tablet TAKE 1 TABLET (25 MG TOTAL) BY MOUTH DAILY AFTER BREAKFAST. 90 tablet 1  . fexofenadine (ALLEGRA) 180 MG tablet Take 1 tablet by mouth daily.    Marland Kitchen glucosamine-chondroitin 500-400 MG tablet Take 1 tablet by mouth 2 (two) times daily.    . Magnesium 500 MG TABS Take by mouth.    . Melatonin 5 MG TABS Take 5 mg by mouth at bedtime as needed.    . MULTIPLE VITAMIN PO Take 1 tablet by mouth daily.    . Omega 3 1000 MG CAPS Take 1,000 mg by mouth 2 (two) times daily.    . Probiotic Product (PROBIOTIC DAILY PO) Take  10 mg by mouth 2 (two) times daily.    Marland Kitchen triamcinolone (NASACORT ALLERGY 24HR) 55 MCG/ACT AERO nasal inhaler Place into the nose.    . Turmeric Curcumin 500 MG CAPS Take 2 tablets by mouth daily.      No current facility-administered medications for this visit.     Review of Systems  Constitutional: Negative for diaphoresis, fever, malaise/fatigue and weight loss (up 4 pounds).  HENT: Negative.  Negative for congestion, nosebleeds and sore throat.   Eyes: Negative.  Negative for blurred vision, double vision, pain and discharge.  Respiratory: Negative for cough, hemoptysis, sputum production and shortness of breath.   Cardiovascular: Negative for chest pain, palpitations, orthopnea, leg swelling and PND.  Gastrointestinal: Negative for abdominal pain, blood in stool, constipation, diarrhea, melena, nausea and vomiting.  Genitourinary: Negative for dysuria, frequency, hematuria and urgency.  Musculoskeletal: Positive for joint pain (RIGHT hip and knee; RIGHT shoulder). Negative for back pain, falls and myalgias.  Skin: Negative for itching and rash.       Numbness in back related to RIGHT breast prosthesis; plans to have removed.  Neurological: Negative for dizziness, tremors, weakness and headaches.  Endo/Heme/Allergies: Does not bruise/bleed easily.       Vasomotor symptoms; "they are manageable".   Psychiatric/Behavioral: Negative for depression, memory loss and suicidal ideas. The patient is not nervous/anxious and does not have insomnia.   All other systems reviewed and are negative.  PERFORMANCE STATUS (ECOG):  1  Physical Exam: Blood pressure 133/85, pulse 88, temperature 98 F (36.7 C), temperature source Tympanic, resp. rate 18, weight 169 lb 1.5 oz (76.7 kg). GENERAL:  Well developed, well nourished, woman sitting comfortably in the exam room in no acute distress. MENTAL STATUS:  Alert and oriented to person, place and time. HEAD:  Blonde styled hair.  Normocephalic,  atraumatic, face symmetric, no Cushingoid features. EYES:  Glasses.  Blue eyes.  Pupils equal round and reactive to light and accomodation.  No conjunctivitis or scleral icterus. ENT:  Oropharynx clear without lesion.  Tongue normal. Mucous membranes moist.  RESPIRATORY:  Clear to auscultation without rales, wheezes or rhonchi. CARDIOVASCULAR:  Regular rate and rhythm without murmur, rub or gallop. BREAST:  Right breast reconstruction extending into axillae with midline horizontal incision.  No erythema or nodularity.  Left breast without masses, skin changes or nipple discharge.  Above left nipple area of fullness (stable). ABDOMEN:  Soft, non-tender, with active bowel sounds, and no hepatosplenomegaly.  No masses. SKIN:  No rashes, ulcers or lesions. EXTREMITIES: No edema, no skin discoloration or tenderness.  No palpable cords. LYMPH NODES: No palpable cervical, supraclavicular, axillary or inguinal adenopathy  NEUROLOGICAL: Unremarkable. PSYCH:  Appropriate.  No visits with results within 3 Day(s) from this visit.  Latest known visit with results is:  Office Visit on 07/16/2017  Component Date Value Ref Range Status  . Color, UA 07/16/2017 yellow   Final  . Clarity, UA 07/16/2017 clear   Final  . Glucose, UA 07/16/2017 neg   Final  . Bilirubin, UA 07/16/2017 neg   Final  . Ketones, UA 07/16/2017 neg   Final  . Spec Grav, UA 07/16/2017 1.015  1.010 - 1.025 Final  . Blood, UA 07/16/2017 neg   Final  . pH, UA 07/16/2017 6.5  5.0 - 8.0 Final  . Protein, UA 07/16/2017 neg   Final  . Urobilinogen, UA 07/16/2017 0.2  0.2 or 1.0 E.U./dL Final  . Nitrite, UA 07/16/2017 neg   Final  . Leukocytes, UA 07/16/2017 Negative  Negative Final  . Appearance 07/16/2017 clear   Final  . Odor 07/16/2017 none   Final  . Hep C Virus Ab 07/16/2017 1.0* 0.0 - 0.9 s/co ratio Final   Comment:                                   Negative:     < 0.8                              Indeterminate: 0.8 - 0.9                                    Positive:     > 0.9  The CDC recommends that a positive HCV antibody result  be followed up with a HCV Nucleic Acid Amplification  test (147829).   . WBC 07/16/2017 6.1  3.4 - 10.8 x10E3/uL Final  . RBC 07/16/2017 4.67  3.77 - 5.28 x10E6/uL Final  . Hemoglobin 07/16/2017 14.1  11.1 - 15.9 g/dL Final  . Hematocrit 07/16/2017 43.3  34.0 - 46.6 % Final  . MCV 07/16/2017 93  79 - 97 fL Final  . MCH 07/16/2017 30.2  26.6 - 33.0 pg Final  . MCHC 07/16/2017 32.6  31.5 - 35.7 g/dL Final  . RDW 07/16/2017 13.9  12.3 - 15.4 % Final  . Platelets 07/16/2017 310  150 - 379 x10E3/uL Final  . Neutrophils 07/16/2017 55  Not Estab. % Final  . Lymphs 07/16/2017 30  Not Estab. % Final  . Monocytes 07/16/2017 11  Not Estab. % Final  . Eos 07/16/2017 3  Not Estab. % Final  . Basos 07/16/2017 1  Not Estab. % Final  . Neutrophils Absolute 07/16/2017 3.3  1.4 - 7.0 x10E3/uL Final  . Lymphocytes Absolute 07/16/2017 1.8  0.7 - 3.1 x10E3/uL Final  . Monocytes Absolute 07/16/2017 0.7  0.1 - 0.9 x10E3/uL Final  . EOS (ABSOLUTE) 07/16/2017 0.2  0.0 - 0.4 x10E3/uL Final  . Basophils Absolute 07/16/2017 0.0  0.0 - 0.2 x10E3/uL Final  . Immature Granulocytes 07/16/2017 0  Not Estab. % Final  . Immature Grans (Abs) 07/16/2017 0.0  0.0 - 0.1 x10E3/uL Final  . Glucose 07/16/2017 81  65 - 99 mg/dL Final  . BUN 07/16/2017 13  8 - 27 mg/dL Final  . Creatinine, Ser 07/16/2017 0.87  0.57 - 1.00 mg/dL Final  . GFR calc non Af Amer 07/16/2017 71  >59 mL/min/1.73 Final  .  GFR calc Af Amer 07/16/2017 81  >59 mL/min/1.73 Final  . BUN/Creatinine Ratio 07/16/2017 15  12 - 28 Final  . Sodium 07/16/2017 142  134 - 144 mmol/L Final  . Potassium 07/16/2017 4.5  3.5 - 5.2 mmol/L Final  . Chloride 07/16/2017 102  96 - 106 mmol/L Final  . CO2 07/16/2017 25  20 - 29 mmol/L Final  . Calcium 07/16/2017 9.6  8.7 - 10.3 mg/dL Final  . Total Protein 07/16/2017 7.1  6.0 - 8.5 g/dL Final  . Albumin 07/16/2017  4.3  3.6 - 4.8 g/dL Final  . Globulin, Total 07/16/2017 2.8  1.5 - 4.5 g/dL Final  . Albumin/Globulin Ratio 07/16/2017 1.5  1.2 - 2.2 Final  . Bilirubin Total 07/16/2017 0.4  0.0 - 1.2 mg/dL Final  . Alkaline Phosphatase 07/16/2017 66  39 - 117 IU/L Final  . AST 07/16/2017 28  0 - 40 IU/L Final  . ALT 07/16/2017 24  0 - 32 IU/L Final  . Cholesterol, Total 07/16/2017 243* 100 - 199 mg/dL Final  . Triglycerides 07/16/2017 110  0 - 149 mg/dL Final  . HDL 07/16/2017 57  >39 mg/dL Final  . VLDL Cholesterol Cal 07/16/2017 22  5 - 40 mg/dL Final  . LDL Calculated 07/16/2017 164* 0 - 99 mg/dL Final  . Chol/HDL Ratio 07/16/2017 4.3  0.0 - 4.4 ratio Final   Comment:                                   T. Chol/HDL Ratio                                             Men  Women                               1/2 Avg.Risk  3.4    3.3                                   Avg.Risk  5.0    4.4                                2X Avg.Risk  9.6    7.1                                3X Avg.Risk 23.4   11.0   . TSH 07/16/2017 2.060  0.450 - 4.500 uIU/mL Final  . CA 27.29 07/16/2017 19.6  0.0 - 38.6 U/mL Final   Comment: Siemens Centaur Immunochemiluminometric Methodology (ICMA) Values obtained with different assay methods or kits cannot be used interchangeably. Results cannot be interpreted as absolute evidence of the presence or absence of malignant disease.     Assessment:  [name] is a 67 y.o. female with a stage IA right breast cancer status post mastectomy on 02/24/2013.  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 Sentinel lymph node was negative. Tumor was ER/PR positive and HER-2/neu negative.  She underwent right sided breast reconstruction and left sided breast reduction on 01/18/2015.  Left  sided mammogram on 11/06/2014 revealed no evidence of malignancy.  Left sided diagnostic mammogram on 05/19/2016 revealed probable benign calcifications in the left breast from fat necrosis.    Diagnostic left mammogram on 11/24/2016 revealed no suspicious masses or concerning for malignancy in the left breast. There was a large area of evolving fat necrosis noted in the upper central and medial left breast.   She has been on aromatase inhibitors since 03/2013. She was initially on Femara, then Arimidex, but discontinued secondary to hot flashes and joint pain. Currently she is tolerating Aromasin.  She continues to have joint pain, hot flashes, and night sweats.  Occasional Aleve relieves joint pain.    CA27.29 has been followed: 22.6 on 09/20/2014, 25.3 on 02/28/2015, 20.8 on 10/03/2015, 22.6 on 05/28/2016, 22.4 on 12/03/2016, and 19.6 on 07/16/2017.  Bone density on 01/14/2017 was normal with a T-score of -0.7 in the AP spine L1-L4 and - 0.5 in the right femoral neck.  She continues to take daily calcium and vitamin D supplements.  She had a history of an abnormal PAP smear and is status post hysterectomy. 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 BRCA 1 and 2 testing was negative.   Symptomatically, she is doing well.  She has continued vasomotor symptoms. She declines Effexor.  She notes pain from RIGHT breast implant.  She plans on having it removed. Exam reveals stable post-operative changes.  Plan: 1.  Review labs from 07/16/2017.  Patient notes needle stick in 1981. 2.  Review interval bone density study- normal. Ten-year fracture probability by FRAX of 3% or greater for hip fracture or 20% or greater for major osteoporotic fracture. 3.  Schedule left mammogram on 11/24/2017. 4.  Continue Aromasin until 02/2018.  5.  Continue calcium and vitamin D. 6.  RTC in 03/24/2018 for MD assessment, labs (CBC with diff, CMP, CA27.29), and review of mammogram.   Honor Loh, NP  07/22/2017, 11:34 AM   I saw and evaluated the patient, participating in the key portions of the service and reviewing pertinent diagnostic studies and records.  I reviewed  the nurse practitioner's note and agree with the findings and the plan.  The assessment and plan were discussed with the patient.  Several questions were asked by the patient and answered.   Lequita Asal, MD  07/22/2017,11:34 AM

## 2017-11-10 ENCOUNTER — Other Ambulatory Visit: Payer: Self-pay | Admitting: Hematology and Oncology

## 2017-11-23 ENCOUNTER — Other Ambulatory Visit: Payer: Self-pay | Admitting: Urgent Care

## 2017-11-23 DIAGNOSIS — Z1231 Encounter for screening mammogram for malignant neoplasm of breast: Secondary | ICD-10-CM

## 2017-12-15 ENCOUNTER — Ambulatory Visit
Admission: RE | Admit: 2017-12-15 | Discharge: 2017-12-15 | Disposition: A | Discharge status: Home / Self Care | Payer: 59 | Source: Ambulatory Visit | Attending: Urgent Care | Admitting: Urgent Care

## 2017-12-15 DIAGNOSIS — Z1231 Encounter for screening mammogram for malignant neoplasm of breast: Secondary | ICD-10-CM | POA: Diagnosis not present

## 2017-12-16 ENCOUNTER — Ambulatory Visit (INDEPENDENT_AMBULATORY_CARE_PROVIDER_SITE_OTHER): Payer: 59 | Admitting: Plastic Surgery

## 2017-12-16 ENCOUNTER — Encounter: Payer: Self-pay | Admitting: Plastic Surgery

## 2017-12-16 VITALS — BP 150/80 | HR 85 | Resp 20 | Ht 66.0 in | Wt 168.0 lb

## 2017-12-16 DIAGNOSIS — N651 Disproportion of reconstructed breast: Secondary | ICD-10-CM

## 2017-12-16 DIAGNOSIS — Z853 Personal history of malignant neoplasm of breast: Secondary | ICD-10-CM | POA: Diagnosis not present

## 2017-12-16 DIAGNOSIS — Z9889 Other specified postprocedural states: Secondary | ICD-10-CM | POA: Diagnosis not present

## 2017-12-16 NOTE — Progress Notes (Signed)
Subjective:    Patient ID: [name], female    DOB: Mar 16, 1951, 67 y.o.   MRN: [id number]  The patient is a 67 yrs old wf here for evaluation of her breast reconstruction.  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  She underwent a right mastectomy in 2014 by Dr. Shade Flood and reconstruction in 2014 with an expander and implant placement.  The left side was reduced for symmetry.  She feels that the right implant is located very laterally and pushes against the tissue under her arm.  It is likely a wide implant and is resting laterally which can happen.  The left side has some superior fat necrosis / firmness.  The incisions are around the NAC and vertical to the inframammary fold.  She is concerned about the firmness.  There are no other concerning areas.  According to the records she has an Allergan Inspira 445 cc implant in place.  Review of Systems  Constitutional: Negative for activity change and appetite change.  HENT: Negative for congestion.   Eyes: Negative.   Respiratory: Negative.  Negative for chest tightness and shortness of breath.   Cardiovascular: Negative for chest pain.  Endocrine: Negative.   Genitourinary: Negative.   Musculoskeletal: Negative.   Skin: Negative.  Negative for color change and wound.  Neurological: Negative.   Psychiatric/Behavioral: Negative.     Current Outpatient Medications:  .  Calcium Carbonate-Vitamin D (CALCIUM + D PO), Take by mouth. , Disp: , Rfl:  .  CINNAMON PO, Take by mouth., Disp: , Rfl:  .  esomeprazole (NEXIUM) 20 MG capsule, Take 20 mg by mouth daily at 12 noon., Disp: , Rfl:  .  exemestane (AROMASIN) 25 MG tablet, TAKE 1 TABLET (25 MG TOTAL) BY MOUTH DAILY AFTER BREAKFAST., Disp: 90 tablet, Rfl: 0 .  fexofenadine (ALLEGRA) 180 MG tablet, Take 1 tablet by mouth daily., Disp: , Rfl:  .  glucosamine-chondroitin 500-400 MG tablet, Take 1 tablet by mouth 2 (two) times daily., Disp: , Rfl:  .  Magnesium 500 MG TABS, Take by mouth.,  Disp: , Rfl:  .  Melatonin 5 MG TABS, Take 5 mg by mouth at bedtime as needed., Disp: , Rfl:  .  MULTIPLE VITAMIN PO, Take 1 tablet by mouth daily., Disp: , Rfl:  .  Omega 3 1000 MG CAPS, Take 1,000 mg by mouth 2 (two) times daily., Disp: , Rfl:  .  Probiotic Product (PROBIOTIC DAILY PO), Take 10 mg by mouth 2 (two) times daily., Disp: , Rfl:  .  triamcinolone (NASACORT ALLERGY 24HR) 55 MCG/ACT AERO nasal inhaler, Place into the nose., Disp: , Rfl:  .  Turmeric Curcumin 500 MG CAPS, Take 2 tablets by mouth daily. , Disp: , Rfl:      Objective:   Physical Exam  Constitutional: She is oriented to person, place, and time. She appears well-developed and well-nourished.  HENT:  Head: Normocephalic and atraumatic.  Eyes: Pupils are equal, round, and reactive to light. EOM are normal.  Cardiovascular: Normal rate.  Pulmonary/Chest: Effort normal. No respiratory distress.  Neurological: She is alert and oriented to person, place, and time.  Skin: Skin is warm.  Psychiatric: She has a normal mood and affect. Her behavior is normal. Judgment and thought content normal.   Vitals:   12/16/17 1227  BP: (!) 150/80  Pulse: 85  Resp: 20  SpO2: 98%  Weight: 168 lb (76.2 kg)  Height: 5\' 6"  (1.676 m)  Assessment & Plan:  Breast asymmetry between native breast and reconstructed breast  History of breast cancer in female  History of reduction surgery of left breast  History of reconstruction of right breast  Interested in having the surgery in [place] around November or December: Left breast reduction with excision of fat necrosis Release of right breast capsule contracture and placement of smaller implant Multiple options were discussed including removal of right implant with left mastectomy.  She would like to try for the above reconstruction. We will need an updated mammogram.

## 2017-12-23 ENCOUNTER — Telehealth: Payer: Self-pay | Admitting: Plastic Surgery

## 2017-12-23 NOTE — Telephone Encounter (Signed)
Patient called inquiring about surgery approval from insurance and possibly scheduling same.

## 2017-12-25 NOTE — Telephone Encounter (Signed)
I was unable to connect with her before I left, if she calls back will you let her know that her surgery was pre-certed and I will get with her early the week I am back to get her set up

## 2018-02-08 ENCOUNTER — Encounter
Admission: RE | Admit: 2018-02-08 | Discharge: 2018-02-08 | Disposition: A | Discharge status: Home / Self Care | Payer: 59 | Source: Ambulatory Visit | Attending: Plastic Surgery | Admitting: Plastic Surgery

## 2018-02-08 ENCOUNTER — Other Ambulatory Visit: Payer: Self-pay

## 2018-02-08 DIAGNOSIS — Z01812 Encounter for preprocedural laboratory examination: Secondary | ICD-10-CM | POA: Insufficient documentation

## 2018-02-08 HISTORY — DX: Gastro-esophageal reflux disease without esophagitis: K21.9

## 2018-02-08 NOTE — Patient Instructions (Signed)
Your procedure is scheduled on: February 17, 2018 Wednesday  Report to Day Surgery on the 2nd floor of the [facility]. To find out your arrival time, please call [phone] between 1PM - 3PM on: February 16, 2018 TUESDAY  REMEMBER: Instructions that are not followed completely may result in serious medical risk, up to and including death; or upon the discretion of your surgeon and anesthesiologist your surgery may need to be rescheduled.  Do not eat food after midnight the night before surgery.  No gum chewing, lozengers or hard candies.  You may however, drink CLEAR liquids up to 2 hours before you are scheduled to arrive for your surgery. Do not drink anything within 2 hours of the start of your surgery.  Clear liquids include: - water  - apple juice without pulp - CLEAR gatorade - black coffee or tea (Do NOT add milk or creamers to the coffee or tea) Do NOT drink anything that is not on this list.  Type 1 and Type 2 diabetics should only drink water.  No Alcohol for 24 hours before or after surgery.  No Smoking including e-cigarettes for 24 hours prior to surgery.  No chewable tobacco products for at least 6 hours prior to surgery.  No nicotine patches on the day of surgery.  On the morning of surgery brush your teeth with toothpaste and water, you may rinse your mouth with mouthwash if you wish. Do not swallow any toothpaste or mouthwash.  Notify your doctor if there is any change in your medical condition (cold, fever, infection).  Do not wear jewelry, make-up, hairpins, clips or nail polish.  Do not wear lotions, powders, or perfumes.   Do not shave 48 hours prior to surgery.   Contacts and dentures may not be worn into surgery.  Do not bring valuables to the hospital, including drivers license, insurance or credit cards.  [facility] is not responsible for any belongings or valuables.   TAKE THESE MEDICATIONS THE MORNING OF SURGERY:  [place] (take one the night  before and one on the morning of surgery - helps to prevent nausea after surgery.)  Use CHG Soap  as directed on instruction sheet.   Use inhalers on the day of surgery    Follow recommendations from Cardiologist, Pulmonologist or PCP regarding stopping Aspirin, Coumadin, Plavix, Eliquis, Pradaxa, or Pletal.  Stop Anti-inflammatories (NSAIDS) such as Advil, Aleve, Ibuprofen, Motrin, Naproxen, Naprosyn and Aspirin based products such as Excedrin, Goodys Powder, BC Powder. (May take Tylenol or Acetaminophen if needed.)  Stop ANY OVER THE COUNTER supplements until after surgery.TURMERIC, CINNAMON, OMEGA 3 (May continue CALCIUM Vitamin D, MAGNESIUM PROBIOTIC Vitamin B, and multivitamin.)  Wear comfortable clothing (specific to your surgery type) to the hospital.  Plan for stool softeners for home use.  If you are being admitted to the hospital overnight, leave your suitcase in the car. After surgery it may be brought to your room.  If you are being discharged the day of surgery, you will not be allowed to drive home. You will need a responsible adult to drive you home and stay with you that night.   If you are taking public transportation, you will need to have a responsible adult with you. Please confirm with your physician that it is acceptable to use public transportation.   Please call [phone] if you have any questions about these instructions.

## 2018-02-09 ENCOUNTER — Encounter: Payer: Self-pay | Admitting: Plastic Surgery

## 2018-02-09 ENCOUNTER — Other Ambulatory Visit: Payer: 59

## 2018-02-09 ENCOUNTER — Ambulatory Visit (INDEPENDENT_AMBULATORY_CARE_PROVIDER_SITE_OTHER): Payer: Self-pay | Admitting: Plastic Surgery

## 2018-02-09 VITALS — BP 124/88 | HR 68 | Resp 14 | Ht 66.0 in | Wt 170.0 lb

## 2018-02-09 DIAGNOSIS — C50911 Malignant neoplasm of unspecified site of right female breast: Secondary | ICD-10-CM

## 2018-02-09 DIAGNOSIS — Z9011 Acquired absence of right breast and nipple: Secondary | ICD-10-CM

## 2018-02-09 DIAGNOSIS — N651 Disproportion of reconstructed breast: Secondary | ICD-10-CM | POA: Insufficient documentation

## 2018-02-09 DIAGNOSIS — Z9010 Acquired absence of unspecified breast and nipple: Secondary | ICD-10-CM | POA: Insufficient documentation

## 2018-02-09 MED ORDER — CIPROFLOXACIN HCL 500 MG PO TABS: 500.0000 mg | ORAL_TABLET | Freq: Two times a day (BID) | ORAL | 0 refills | Status: AC

## 2018-02-09 MED ORDER — HYDROCODONE-ACETAMINOPHEN 5-325 MG PO TABS: 1.0000 | ORAL_TABLET | Freq: Four times a day (QID) | ORAL | 0 refills | Status: AC | PRN

## 2018-02-09 MED ORDER — ONDANSETRON HCL 4 MG PO TABS: 4.0000 mg | ORAL_TABLET | Freq: Three times a day (TID) | ORAL | 0 refills | Status: DC | PRN

## 2018-02-09 NOTE — Progress Notes (Signed)
Patient ID: [name], female    DOB: January 23, 1951, 67 y.o.   MRN: [id number]   Chief Complaint  Patient presents with  . Breast Problem    The patient is a 67 year old wf here for history and physical for tertiary breast reconstruction.  She had a right mastectomy in 2014 with expander and then implant placement on the right side.  The left side had a mastopexy reduction for symmetry.  Now she has noticed lateral displacement of the right implant and it feels like it is under her arms.  The left breast has some fat necrosis on the superior aspect of the breast and firmness.  The left breast incision is around the nipple areolar complex and vertical to the inframammary fold.  She currently has an Allergan Inspira 445 cc implant on the right.  She is okay with going slightly smaller.  She had an upper respiratory infection a few weeks ago but now is feeling back to normal.   Review of Systems  Constitutional: Negative for activity change and appetite change.  HENT: Negative.   Eyes: Negative.   Respiratory: Negative.   Cardiovascular: Negative.  Negative for chest pain.  Gastrointestinal: Negative.   Endocrine: Negative.   Genitourinary: Negative.   Musculoskeletal: Negative.   Skin: Negative.  Negative for color change and wound.  Neurological: Negative.     Past Medical History:  Diagnosis Date  . Arthritis   . Breast cancer ([place]) 02/2014  . 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 GERD (gastroesophageal reflux disease)   . Hepatitis    Hep B in 1981  . PONV (postoperative nausea and vomiting)    pt states scopalamine patch helped and zofran and phenergan helped    Past Surgical History:  Procedure Laterality Date  . ABDOMINAL HYSTERECTOMY  1997  . AUGMENTATION MAMMAPLASTY Right 85/6314   Silicone  . BREAST BIOPSY Right    Positive  . BREAST REDUCTION SURGERY Left 01/18/2015   Procedure: MAMMARY REDUCTION  (BREAST);  Surgeon: Nicholaus Bloom, MD;  Location: [facility];   Service: Plastics;  Laterality: Left;  . REDUCTION MAMMAPLASTY Left 01/2015  . TISSUE EXPANDER PLACEMENT Right 01/18/2015   Procedure: Right  BREAST EXPANDER PLACEMENT WITH ALLODERM;  Surgeon: Nicholaus Bloom, MD;  Location: [facility];  Service: Plastics;  Laterality: Right;  DR COAN RM 2 PER BRENDA  . TISSUE EXPANDER PLACEMENT Right 03/13/2015   Procedure: Right breast tissue expander removal and Sillicone implant placement.;  Surgeon: Nicholaus Bloom, MD;  Location: [facility];  Service: Plastics;  Laterality: Right;  . TOTAL MASTECTOMY Right 02/2013   stage 1 ER/PR+  . VULVECTOMY        Current Outpatient Medications:  .  Calcium Carbonate-Vitamin D (CALCIUM + D PO), Take 1 tablet by mouth 2 (two) times daily. , Disp: , Rfl:  .  Cinnamon 500 MG TABS, Take 1,000 mg by mouth daily. , Disp: , Rfl:  .  ciprofloxacin (CIPRO) 500 MG tablet, Take 1 tablet (500 mg total) by mouth 2 (two) times daily for 3 days., Disp: 6 tablet, Rfl: 0 .  esomeprazole (NEXIUM) 20 MG capsule, Take 20 mg by mouth daily at 12 noon., Disp: , Rfl:  .  exemestane (AROMASIN) 25 MG tablet, TAKE 1 TABLET (25 MG TOTAL) BY MOUTH DAILY AFTER BREAKFAST. (Patient not taking: Reported on 02/03/2018), Disp: 90 tablet, Rfl: 0 .  fexofenadine (ALLEGRA) 180 MG tablet, Take 180 mg by mouth daily as  needed for allergies. , Disp: , Rfl:  .  HYDROcodone-acetaminophen (NORCO/VICODIN) 5-325 MG tablet, Take 1 tablet by mouth every 6 (six) hours as needed for up to 5 days for moderate pain., Disp: 20 tablet, Rfl: 0 .  Magnesium 500 MG TABS, Take 500 mg by mouth daily. , Disp: , Rfl:  .  Omega 3 1000 MG CAPS, Take 1,000 mg by mouth daily. , Disp: , Rfl:  .  ondansetron (ZOFRAN) 4 MG tablet, Take 1 tablet (4 mg total) by mouth every 8 (eight) hours as needed for up to 10 doses for nausea or vomiting., Disp: 5 tablet, Rfl: 0 .  Probiotic Product (PROBIOTIC DAILY PO), Take 1 capsule by mouth 2 (two) times daily. , Disp: , Rfl:  .   triamcinolone (NASACORT ALLERGY 24HR) 55 MCG/ACT AERO nasal inhaler, Place 2 sprays into the nose daily as needed (allergies). , Disp: , Rfl:  .  Turmeric Curcumin 500 MG CAPS, Take 1,000 mg by mouth daily. , Disp: , Rfl:    Objective:   Vitals:   02/09/18 1209  BP: 124/88  Pulse: 68  Resp: 14  SpO2: 100%    Physical Exam  Constitutional: She is oriented to person, place, and time. She appears well-developed and well-nourished.  HENT:  Head: Normocephalic and atraumatic.  Eyes: Pupils are equal, round, and reactive to light. EOM are normal.  Cardiovascular: Normal rate.  Pulmonary/Chest: Effort normal.  Abdominal: Soft. She exhibits no distension. There is no tenderness.  Neurological: She is alert and oriented to person, place, and time.  Psychiatric: She has a normal mood and affect. Her behavior is normal. Judgment and thought content normal.    Assessment & Plan:  Malignant neoplasm of right female breast, unspecified estrogen receptor status, unspecified site of breast ([place])  Acquired absence of right breast  Breast asymmetry following reconstructive surgery Plan for modified left breast mastopexy reduction and right implant exchange with capsule release.  The risks that can be encountered with and after placement of a breast implant and breast surgery were discussed and include the following but not limited to these: bleeding, infection, delayed healing, anesthesia risks, skin sensation changes, injury to structures including nerves, blood vessels, and muscles which may be temporary or permanent, allergies to tape, suture materials and glues, blood products, topical preparations or injected agents, skin contour irregularities, skin discoloration and swelling, deep vein thrombosis, cardiac and pulmonary complications, pain, which may persist, fluid accumulation, wrinkling of the skin over the expander, changes in nipple or breast sensation, expander leakage or rupture, faulty  position of the expander, persistent pain, formation of tight scar tissue around the expander (capsular contracture), possible need for revisional surgery or staged procedures.   [place], DO

## 2018-02-09 NOTE — H&P (View-Only) (Signed)
Patient ID: [name], female    DOB: [birth year], 67 y.o.   MRN: [id number]   Chief Complaint  Patient presents with  . Breast Problem    The patient is a 67 year old wf here for history and physical for tertiary breast reconstruction.  She had a right mastectomy in 2014 with expander and then implant placement on the right side.  The left side had a mastopexy reduction for symmetry.  Now she has noticed lateral displacement of the right implant and it feels like it is under her arms.  The left breast has some fat necrosis on the superior aspect of the breast and firmness.  The left breast incision is around the nipple areolar complex and vertical to the inframammary fold.  She currently has an Allergan Inspira 445 cc implant on the right.  She is okay with going slightly smaller.  She had an upper respiratory infection a few weeks ago but now is feeling back to normal.   Review of Systems  Constitutional: Negative for activity change and appetite change.  HENT: Negative.   Eyes: Negative.   Respiratory: Negative.   Cardiovascular: Negative.  Negative for chest pain.  Gastrointestinal: Negative.   Endocrine: Negative.   Genitourinary: Negative.   Musculoskeletal: Negative.   Skin: Negative.  Negative for color change and wound.  Neurological: Negative.     Past Medical History:  Diagnosis Date  . Arthritis   . Breast cancer ([facility]) 02/2014  . 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 GERD (gastroesophageal reflux disease)   . Hepatitis    Hep B in 1981  . PONV (postoperative nausea and vomiting)    pt states scopalamine patch helped and zofran and phenergan helped    Past Surgical History:  Procedure Laterality Date  . ABDOMINAL HYSTERECTOMY  1997  . AUGMENTATION MAMMAPLASTY Right 29/9371   Silicone  . BREAST BIOPSY Right    Positive  . BREAST REDUCTION SURGERY Left 01/18/2015   Procedure: MAMMARY REDUCTION  (BREAST);  Surgeon: Nicholaus Bloom, MD;  Location: [facility];   Service: Plastics;  Laterality: Left;  . REDUCTION MAMMAPLASTY Left 01/2015  . TISSUE EXPANDER PLACEMENT Right 01/18/2015   Procedure: Right  BREAST EXPANDER PLACEMENT WITH ALLODERM;  Surgeon: Nicholaus Bloom, MD;  Location: [facility];  Service: Plastics;  Laterality: Right;  DR COAN RM 2 PER BRENDA  . TISSUE EXPANDER PLACEMENT Right 03/13/2015   Procedure: Right breast tissue expander removal and Sillicone implant placement.;  Surgeon: Nicholaus Bloom, MD;  Location: [facility];  Service: Plastics;  Laterality: Right;  . TOTAL MASTECTOMY Right 02/2013   stage 1 ER/PR+  . VULVECTOMY        Current Outpatient Medications:  .  Calcium Carbonate-Vitamin D (CALCIUM + D PO), Take 1 tablet by mouth 2 (two) times daily. , Disp: , Rfl:  .  Cinnamon 500 MG TABS, Take 1,000 mg by mouth daily. , Disp: , Rfl:  .  ciprofloxacin (CIPRO) 500 MG tablet, Take 1 tablet (500 mg total) by mouth 2 (two) times daily for 3 days., Disp: 6 tablet, Rfl: 0 .  esomeprazole (NEXIUM) 20 MG capsule, Take 20 mg by mouth daily at 12 noon., Disp: , Rfl:  .  exemestane (AROMASIN) 25 MG tablet, TAKE 1 TABLET (25 MG TOTAL) BY MOUTH DAILY AFTER BREAKFAST. (Patient not taking: Reported on 02/03/2018), Disp: 90 tablet, Rfl: 0 .  fexofenadine (ALLEGRA) 180 MG tablet, Take 180 mg by mouth daily as  needed for allergies. , Disp: , Rfl:  .  HYDROcodone-acetaminophen (NORCO/VICODIN) 5-325 MG tablet, Take 1 tablet by mouth every 6 (six) hours as needed for up to 5 days for moderate pain., Disp: 20 tablet, Rfl: 0 .  Magnesium 500 MG TABS, Take 500 mg by mouth daily. , Disp: , Rfl:  .  Omega 3 1000 MG CAPS, Take 1,000 mg by mouth daily. , Disp: , Rfl:  .  ondansetron (ZOFRAN) 4 MG tablet, Take 1 tablet (4 mg total) by mouth every 8 (eight) hours as needed for up to 10 doses for nausea or vomiting., Disp: 5 tablet, Rfl: 0 .  Probiotic Product (PROBIOTIC DAILY PO), Take 1 capsule by mouth 2 (two) times daily. , Disp: , Rfl:  .   triamcinolone (NASACORT ALLERGY 24HR) 55 MCG/ACT AERO nasal inhaler, Place 2 sprays into the nose daily as needed (allergies). , Disp: , Rfl:  .  Turmeric Curcumin 500 MG CAPS, Take 1,000 mg by mouth daily. , Disp: , Rfl:    Objective:   Vitals:   02/09/18 1209  BP: 124/88  Pulse: 68  Resp: 14  SpO2: 100%    Physical Exam  Constitutional: She is oriented to person, place, and time. She appears well-developed and well-nourished.  HENT:  Head: Normocephalic and atraumatic.  Eyes: Pupils are equal, round, and reactive to light. EOM are normal.  Cardiovascular: Normal rate.  Pulmonary/Chest: Effort normal.  Abdominal: Soft. She exhibits no distension. There is no tenderness.  Neurological: She is alert and oriented to person, place, and time.  Psychiatric: She has a normal mood and affect. Her behavior is normal. Judgment and thought content normal.    Assessment & Plan:  Malignant neoplasm of right female breast, unspecified estrogen receptor status, unspecified site of breast ([place])  Acquired absence of right breast  Breast asymmetry following reconstructive surgery Plan for modified left breast mastopexy reduction and right implant exchange with capsule release.  The risks that can be encountered with and after placement of a breast implant and breast surgery were discussed and include the following but not limited to these: bleeding, infection, delayed healing, anesthesia risks, skin sensation changes, injury to structures including nerves, blood vessels, and muscles which may be temporary or permanent, allergies to tape, suture materials and glues, blood products, topical preparations or injected agents, skin contour irregularities, skin discoloration and swelling, deep vein thrombosis, cardiac and pulmonary complications, pain, which may persist, fluid accumulation, wrinkling of the skin over the expander, changes in nipple or breast sensation, expander leakage or rupture, faulty  position of the expander, persistent pain, formation of tight scar tissue around the expander (capsular contracture), possible need for revisional surgery or staged procedures.   [place], DO

## 2018-02-17 ENCOUNTER — Encounter: Payer: Self-pay | Admitting: Emergency Medicine

## 2018-02-17 ENCOUNTER — Other Ambulatory Visit: Payer: Self-pay

## 2018-02-17 ENCOUNTER — Encounter
Admission: RE | Disposition: A | Discharge status: Home / Self Care | Payer: Self-pay | Source: Ambulatory Visit | Attending: Plastic Surgery

## 2018-02-17 ENCOUNTER — Ambulatory Visit
Admission: RE | Admit: 2018-02-17 | Discharge: 2018-02-17 | Disposition: A | Discharge status: Home / Self Care | Payer: 59 | Source: Ambulatory Visit | Attending: Plastic Surgery | Admitting: Plastic Surgery

## 2018-02-17 ENCOUNTER — Ambulatory Visit: Payer: 59

## 2018-02-17 DIAGNOSIS — K219 Gastro-esophageal reflux disease without esophagitis: Secondary | ICD-10-CM | POA: Diagnosis not present

## 2018-02-17 DIAGNOSIS — Z170 Estrogen receptor positive status [ER+]: Secondary | ICD-10-CM | POA: Diagnosis not present

## 2018-02-17 DIAGNOSIS — X58XXXA Exposure to other specified factors, initial encounter: Secondary | ICD-10-CM | POA: Diagnosis not present

## 2018-02-17 DIAGNOSIS — Z9011 Acquired absence of right breast and nipple: Secondary | ICD-10-CM | POA: Diagnosis not present

## 2018-02-17 DIAGNOSIS — Z9886 Personal history of breast implant removal: Secondary | ICD-10-CM | POA: Diagnosis not present

## 2018-02-17 DIAGNOSIS — Z421 Encounter for breast reconstruction following mastectomy: Secondary | ICD-10-CM | POA: Diagnosis not present

## 2018-02-17 DIAGNOSIS — N651 Disproportion of reconstructed breast: Secondary | ICD-10-CM | POA: Diagnosis not present

## 2018-02-17 DIAGNOSIS — M1990 Unspecified osteoarthritis, unspecified site: Secondary | ICD-10-CM | POA: Insufficient documentation

## 2018-02-17 DIAGNOSIS — N641 Fat necrosis of breast: Secondary | ICD-10-CM | POA: Insufficient documentation

## 2018-02-17 DIAGNOSIS — Z9012 Acquired absence of left breast and nipple: Secondary | ICD-10-CM | POA: Diagnosis not present

## 2018-02-17 DIAGNOSIS — Z90710 Acquired absence of both cervix and uterus: Secondary | ICD-10-CM | POA: Diagnosis not present

## 2018-02-17 DIAGNOSIS — Z9889 Other specified postprocedural states: Secondary | ICD-10-CM | POA: Diagnosis not present

## 2018-02-17 DIAGNOSIS — T8542XA Displacement of breast prosthesis and implant, initial encounter: Secondary | ICD-10-CM | POA: Insufficient documentation

## 2018-02-17 DIAGNOSIS — Z8619 Personal history of other infectious and parasitic diseases: Secondary | ICD-10-CM | POA: Diagnosis not present

## 2018-02-17 DIAGNOSIS — Z853 Personal history of malignant neoplasm of breast: Secondary | ICD-10-CM | POA: Diagnosis not present

## 2018-02-17 DIAGNOSIS — Z79899 Other long term (current) drug therapy: Secondary | ICD-10-CM | POA: Diagnosis not present

## 2018-02-17 HISTORY — PX: BREAST EXCISIONAL BIOPSY: SUR124

## 2018-02-17 HISTORY — PX: PLACEMENT OF BREAST IMPLANTS: SHX6334

## 2018-02-17 HISTORY — PX: BREAST REDUCTION SURGERY: SHX8

## 2018-02-17 SURGERY — BREAST REDUCTION WITH LIPOSUCTION
Anesthesia: General | Laterality: Right

## 2018-02-17 MED ORDER — SUGAMMADEX SODIUM 200 MG/2ML IV SOLN
INTRAVENOUS | Status: AC
  Filled 2018-02-17: qty 2

## 2018-02-17 MED ORDER — SODIUM CHLORIDE 0.9 % IV SOLN: 250.0000 mL | INTRAVENOUS | Status: DC | PRN

## 2018-02-17 MED ORDER — FENTANYL CITRATE (PF) 100 MCG/2ML IJ SOLN
INTRAMUSCULAR | Status: DC | PRN
  Administered 2018-02-17 (×2): 25 ug via INTRAVENOUS
  Administered 2018-02-17: 100 ug via INTRAVENOUS
  Administered 2018-02-17: 50 ug via INTRAVENOUS

## 2018-02-17 MED ORDER — CIPROFLOXACIN IN D5W 400 MG/200ML IV SOLN
400.0000 mg | INTRAVENOUS | Status: AC
  Administered 2018-02-17: 400 mg via INTRAVENOUS

## 2018-02-17 MED ORDER — PROMETHAZINE HCL 25 MG/ML IJ SOLN: 6.2500 mg | INTRAMUSCULAR | Status: DC | PRN

## 2018-02-17 MED ORDER — PROPOFOL 10 MG/ML IV BOLUS
INTRAVENOUS | Status: DC | PRN
  Administered 2018-02-17: 150 mg via INTRAVENOUS

## 2018-02-17 MED ORDER — FENTANYL CITRATE (PF) 100 MCG/2ML IJ SOLN
INTRAMUSCULAR | Status: AC
  Filled 2018-02-17: qty 2

## 2018-02-17 MED ORDER — SCOPOLAMINE 1 MG/3DAYS TD PT72
1.0000 | MEDICATED_PATCH | Freq: Once | TRANSDERMAL | Status: DC
  Administered 2018-02-17: 1.5 mg via TRANSDERMAL

## 2018-02-17 MED ORDER — ROCURONIUM BROMIDE 50 MG/5ML IV SOLN
INTRAVENOUS | Status: AC
  Filled 2018-02-17: qty 1

## 2018-02-17 MED ORDER — LIDOCAINE HCL (CARDIAC) PF 100 MG/5ML IV SOSY
PREFILLED_SYRINGE | INTRAVENOUS | Status: DC | PRN
  Administered 2018-02-17: 60 mg via INTRAVENOUS

## 2018-02-17 MED ORDER — SODIUM CHLORIDE 0.9% FLUSH: 3.0000 mL | INTRAVENOUS | Status: DC | PRN

## 2018-02-17 MED ORDER — SODIUM CHLORIDE 0.9% FLUSH: 3.0000 mL | Freq: Two times a day (BID) | INTRAVENOUS | Status: DC

## 2018-02-17 MED ORDER — EPHEDRINE SULFATE 50 MG/ML IJ SOLN
INTRAMUSCULAR | Status: AC
  Filled 2018-02-17: qty 1

## 2018-02-17 MED ORDER — PHENYLEPHRINE HCL 10 MG/ML IJ SOLN
INTRAMUSCULAR | Status: AC
  Filled 2018-02-17: qty 1

## 2018-02-17 MED ORDER — DEXAMETHASONE SODIUM PHOSPHATE 10 MG/ML IJ SOLN
INTRAMUSCULAR | Status: DC | PRN
  Administered 2018-02-17: 10 mg via INTRAVENOUS

## 2018-02-17 MED ORDER — METOCLOPRAMIDE HCL 5 MG/ML IJ SOLN
INTRAMUSCULAR | Status: AC
  Filled 2018-02-17: qty 2

## 2018-02-17 MED ORDER — FENTANYL CITRATE (PF) 100 MCG/2ML IJ SOLN
25.0000 ug | INTRAMUSCULAR | Status: AC | PRN
  Administered 2018-02-17 (×6): 25 ug via INTRAVENOUS

## 2018-02-17 MED ORDER — ACETAMINOPHEN 325 MG PO TABS: 650.0000 mg | ORAL_TABLET | ORAL | Status: DC | PRN

## 2018-02-17 MED ORDER — PROPOFOL 10 MG/ML IV BOLUS
INTRAVENOUS | Status: AC
  Filled 2018-02-17: qty 20

## 2018-02-17 MED ORDER — EPHEDRINE SULFATE 50 MG/ML IJ SOLN
INTRAMUSCULAR | Status: DC | PRN
  Administered 2018-02-17: 10 mg via INTRAVENOUS

## 2018-02-17 MED ORDER — LIDOCAINE HCL (PF) 1 % IJ SOLN
INTRAMUSCULAR | Status: AC
  Filled 2018-02-17: qty 30

## 2018-02-17 MED ORDER — DEXAMETHASONE SODIUM PHOSPHATE 10 MG/ML IJ SOLN
INTRAMUSCULAR | Status: AC
  Filled 2018-02-17: qty 1

## 2018-02-17 MED ORDER — CIPROFLOXACIN IN D5W 400 MG/200ML IV SOLN
INTRAVENOUS | Status: AC
  Filled 2018-02-17: qty 200

## 2018-02-17 MED ORDER — BACITRACIN 50000 UNITS IM SOLR
INTRAMUSCULAR | Status: AC
  Filled 2018-02-17: qty 1

## 2018-02-17 MED ORDER — ONDANSETRON HCL 4 MG/2ML IJ SOLN
INTRAMUSCULAR | Status: DC | PRN
  Administered 2018-02-17: 4 mg via INTRAVENOUS

## 2018-02-17 MED ORDER — SODIUM CHLORIDE (PF) 0.9 % IJ SOLN
INTRAMUSCULAR | Status: AC
  Filled 2018-02-17: qty 50

## 2018-02-17 MED ORDER — SUGAMMADEX SODIUM 200 MG/2ML IV SOLN
INTRAVENOUS | Status: DC | PRN
  Administered 2018-02-17: 150 mg via INTRAVENOUS

## 2018-02-17 MED ORDER — ACETAMINOPHEN 10 MG/ML IV SOLN
INTRAVENOUS | Status: AC
  Filled 2018-02-17: qty 100

## 2018-02-17 MED ORDER — ACETAMINOPHEN 10 MG/ML IV SOLN
INTRAVENOUS | Status: DC | PRN
  Administered 2018-02-17: 1000 mg via INTRAVENOUS

## 2018-02-17 MED ORDER — HYDROCODONE-ACETAMINOPHEN 5-325 MG PO TABS: 1.0000 | ORAL_TABLET | Freq: Once | ORAL | Status: DC

## 2018-02-17 MED ORDER — ONDANSETRON HCL 4 MG/2ML IJ SOLN
INTRAMUSCULAR | Status: AC
  Filled 2018-02-17: qty 2

## 2018-02-17 MED ORDER — BUPIVACAINE-EPINEPHRINE (PF) 0.25% -1:200000 IJ SOLN
INTRAMUSCULAR | Status: AC
  Filled 2018-02-17: qty 30

## 2018-02-17 MED ORDER — MIDAZOLAM HCL 2 MG/2ML IJ SOLN
INTRAMUSCULAR | Status: AC
  Filled 2018-02-17: qty 2

## 2018-02-17 MED ORDER — SODIUM CHLORIDE 0.9 % IV SOLN
INTRAVENOUS | Status: DC | PRN
  Administered 2018-02-17: 1000 mL

## 2018-02-17 MED ORDER — LIDOCAINE HCL (PF) 2 % IJ SOLN
INTRAMUSCULAR | Status: AC
  Filled 2018-02-17: qty 10

## 2018-02-17 MED ORDER — MIDAZOLAM HCL 2 MG/2ML IJ SOLN
INTRAMUSCULAR | Status: DC | PRN
  Administered 2018-02-17: 2 mg via INTRAVENOUS

## 2018-02-17 MED ORDER — ACETAMINOPHEN 650 MG RE SUPP: 650.0000 mg | RECTAL | Status: DC | PRN

## 2018-02-17 MED ORDER — OXYCODONE HCL 5 MG PO TABS: 5.0000 mg | ORAL_TABLET | Freq: Once | ORAL | Status: DC | PRN

## 2018-02-17 MED ORDER — OXYCODONE HCL 5 MG PO TABS
ORAL_TABLET | ORAL | Status: AC
  Filled 2018-02-17: qty 2

## 2018-02-17 MED ORDER — DIPHENHYDRAMINE HCL 50 MG/ML IJ SOLN
INTRAMUSCULAR | Status: DC | PRN
  Administered 2018-02-17: 25 mg via INTRAVENOUS

## 2018-02-17 MED ORDER — HYDROCODONE-ACETAMINOPHEN 5-325 MG PO TABS
ORAL_TABLET | ORAL | Status: AC
  Administered 2018-02-17: 1
  Filled 2018-02-17: qty 1

## 2018-02-17 MED ORDER — LACTATED RINGERS IV SOLN
INTRAVENOUS | Status: DC
  Administered 2018-02-17: 07:00:00 via INTRAVENOUS

## 2018-02-17 MED ORDER — EPINEPHRINE 30 MG/30ML IJ SOLN
INTRAMUSCULAR | Status: AC
  Filled 2018-02-17: qty 1

## 2018-02-17 MED ORDER — MEPERIDINE HCL 50 MG/ML IJ SOLN: 6.2500 mg | INTRAMUSCULAR | Status: DC | PRN

## 2018-02-17 MED ORDER — DEXMEDETOMIDINE HCL 200 MCG/2ML IV SOLN
INTRAVENOUS | Status: DC | PRN
  Administered 2018-02-17: 8 ug via INTRAVENOUS

## 2018-02-17 MED ORDER — OXYCODONE HCL 5 MG/5ML PO SOLN: 5.0000 mg | Freq: Once | ORAL | Status: DC | PRN

## 2018-02-17 MED ORDER — ROCURONIUM BROMIDE 100 MG/10ML IV SOLN
INTRAVENOUS | Status: DC | PRN
  Administered 2018-02-17: 50 mg via INTRAVENOUS

## 2018-02-17 MED ORDER — METOCLOPRAMIDE HCL 5 MG/ML IJ SOLN
INTRAMUSCULAR | Status: DC | PRN
  Administered 2018-02-17: 10 mg via INTRAVENOUS

## 2018-02-17 MED ORDER — SCOPOLAMINE 1 MG/3DAYS TD PT72
MEDICATED_PATCH | TRANSDERMAL | Status: AC
  Administered 2018-02-17: 1.5 mg via TRANSDERMAL
  Filled 2018-02-17: qty 1

## 2018-02-17 MED ORDER — BUPIVACAINE-EPINEPHRINE 0.25% -1:200000 IJ SOLN
INTRAMUSCULAR | Status: DC | PRN
  Administered 2018-02-17: 30 mL

## 2018-02-17 MED ORDER — FENTANYL CITRATE (PF) 100 MCG/2ML IJ SOLN
25.0000 ug | INTRAMUSCULAR | Status: DC | PRN
  Administered 2018-02-17: 25 ug via INTRAVENOUS

## 2018-02-17 MED ORDER — OXYCODONE HCL 5 MG PO TABS
5.0000 mg | ORAL_TABLET | ORAL | Status: DC | PRN
  Administered 2018-02-17: 10 mg via ORAL

## 2018-02-17 SURGICAL SUPPLY — 60 items
BAG DECANTER FOR FLEXI CONT (MISCELLANEOUS) IMPLANT
BINDER BREAST LRG (GAUZE/BANDAGES/DRESSINGS) IMPLANT
BINDER BREAST MEDIUM (GAUZE/BANDAGES/DRESSINGS) IMPLANT
BINDER BREAST XLRG (GAUZE/BANDAGES/DRESSINGS) ×4 IMPLANT
BINDER BREAST XXLRG (GAUZE/BANDAGES/DRESSINGS) IMPLANT
BIOPATCH RED 1 DISK 7.0 (GAUZE/BANDAGES/DRESSINGS) IMPLANT
BIOPATCH RED 1IN DISK 7.0MM (GAUZE/BANDAGES/DRESSINGS)
BLADE BOVIE TIP EXT 4 (BLADE) ×4 IMPLANT
BLADE SURG 15 STRL LF DISP TIS (BLADE) IMPLANT
BLADE SURG 15 STRL SS (BLADE)
BNDG GAUZE 4.5X4.1 6PLY STRL (MISCELLANEOUS) IMPLANT
BULB RESERV EVAC DRAIN JP 100C (MISCELLANEOUS) IMPLANT
CANISTER SUCT 1200ML W/VALVE (MISCELLANEOUS) ×4 IMPLANT
CHLORAPREP W/TINT 26ML (MISCELLANEOUS) ×8 IMPLANT
COVER BACK TABLE 60X90IN (DRAPES) IMPLANT
COVER MAYO STAND STRL (DRAPES) ×4 IMPLANT
COVER WAND RF STERILE (DRAPES) ×4 IMPLANT
DECANTER SPIKE VIAL GLASS SM (MISCELLANEOUS) IMPLANT
DERMABOND ADVANCED (GAUZE/BANDAGES/DRESSINGS) ×4
DERMABOND ADVANCED .7 DNX12 (GAUZE/BANDAGES/DRESSINGS) ×4 IMPLANT
DRAIN CHANNEL 19F RND (DRAIN) IMPLANT
DRAPE LAPAROTOMY TRNSV 106X77 (MISCELLANEOUS) IMPLANT
ELECT CAUTERY BLADE TIP 2.5 (TIP)
ELECT REM PT RETURN 9FT ADLT (ELECTROSURGICAL) ×4
ELECTRODE CAUTERY BLDE TIP 2.5 (TIP) IMPLANT
ELECTRODE REM PT RTRN 9FT ADLT (ELECTROSURGICAL) ×2 IMPLANT
GLOVE BIO SURGEON STRL SZ 6.5 (GLOVE) ×21 IMPLANT
GLOVE BIO SURGEONS STRL SZ 6.5 (GLOVE) ×7
GOWN STRL REUS W/ TWL LRG LVL3 (GOWN DISPOSABLE) ×4 IMPLANT
GOWN STRL REUS W/TWL LRG LVL3 (GOWN DISPOSABLE) ×4
IMPL BREAST HP GEL 400CC (Breast) ×2 IMPLANT
IMPLANT BREAST HP GEL 400CC (Breast) ×4 IMPLANT
NDL SAFETY ECLIPSE 18X1.5 (NEEDLE) IMPLANT
NEEDLE HYPO 18GX1.5 SHARP (NEEDLE)
NEEDLE HYPO 22GX1.5 SAFETY (NEEDLE) IMPLANT
NEEDLE HYPO 25X1 1.5 SAFETY (NEEDLE) ×4 IMPLANT
NS IRRIG 1000ML POUR BTL (IV SOLUTION) ×4 IMPLANT
PACK BASIN MAJOR ARMC (MISCELLANEOUS) ×4 IMPLANT
PAD ABD DERMACEA PRESS 5X9 (GAUZE/BANDAGES/DRESSINGS) ×16 IMPLANT
PAD ALCOHOL SWAB (MISCELLANEOUS) IMPLANT
PAD PREP 24X41 OB/GYN DISP (PERSONAL CARE ITEMS) IMPLANT
SET YANKAUER POOLE SUCT (MISCELLANEOUS) ×4 IMPLANT
SIZER BREAST REUSE 425CC (SIZER) ×4
SIZER BRST REUSE 425CC (SIZER) ×2 IMPLANT
SPONGE LAP 18X18 RF (DISPOSABLE) ×4 IMPLANT
STRIP SUTURE WOUND CLOSURE 1/2 (SUTURE) ×16 IMPLANT
SUT MNCRL AB 4-0 PS2 18 (SUTURE) ×12 IMPLANT
SUT MON AB 3-0 SH 27 (SUTURE) ×12 IMPLANT
SUT MON AB 5-0 P3 18 (SUTURE) ×16 IMPLANT
SUT PDS AB 2-0 CT1 27 (SUTURE) ×8 IMPLANT
SUT PDS AB 2-0 CT2 27 (SUTURE) IMPLANT
SUT SILK 3 0 PS 1 (SUTURE) IMPLANT
SUT VIC AB 3-0 SH 27 (SUTURE)
SUT VIC AB 3-0 SH 27X BRD (SUTURE) IMPLANT
SUT VIC AB 4-0 PS2 18 (SUTURE) IMPLANT
SYR 10ML LL (SYRINGE) ×4 IMPLANT
SYR 3ML LL SCALE MARK (SYRINGE) IMPLANT
SYR 50ML LL SCALE MARK (SYRINGE) IMPLANT
SYR BULB IRRIG 60ML STRL (SYRINGE) ×8 IMPLANT
TOWEL OR 17X26 4PK STRL BLUE (TOWEL DISPOSABLE) ×4 IMPLANT

## 2018-02-17 NOTE — Anesthesia Procedure Notes (Signed)
Procedure Name: Intubation Date/Time: 02/17/2018 7:50 AM Performed by: Jacqualine Mau, RN Pre-anesthesia Checklist: Patient identified, Emergency Drugs available, Suction available, Patient being monitored and Timeout performed Patient Re-evaluated:Patient Re-evaluated prior to induction Oxygen Delivery Method: Circle system utilized Preoxygenation: Pre-oxygenation with 100% oxygen Induction Type: IV induction Ventilation: Mask ventilation without difficulty Laryngoscope Size: McGraph and 3 Grade View: Grade I Tube type: Oral Tube size: 7.0 mm Number of attempts: 1 Airway Equipment and Method: Stylet Placement Confirmation: ETT inserted through vocal cords under direct vision,  positive ETCO2 and breath sounds checked- equal and bilateral Secured at: 21 cm Tube secured with: Tape Dental Injury: Teeth and Oropharynx as per pre-operative assessment

## 2018-02-17 NOTE — Op Note (Signed)
Op report   DATE OF OPERATION:  02/17/2018  LOCATION: [facility]  SURGICAL DIVISION: Plastic Surgery  PREOPERATIVE DIAGNOSES:  1. History of right breast cancer.  2. Acquired absence of right breast.  3. Breast asymmetry after reconstruction.  POSTOPERATIVE DIAGNOSES:  1. History of right breast cancer.  2. Acquired absence of right breast.  3. Breast asymmetry after reconstruction.  PROCEDURE:  1. Removal of right breast and replacement. 2. Capsulotomies for implant repositioning right. 3. Left breast reduction for symmetry. 4. Excision of left breast fat necrosis 2 x 4 cm.  SURGEON: Taelyr Jantz Sanger Jeanett Antonopoulos, DO  ANESTHESIA:  General.   COMPLICATIONS: None.   IMPLANTS: Right:  Mentor Smooth Round High Profile Gel 400cc. Ref #[id number].  Serial Number [id number]  INDICATIONS FOR PROCEDURE:  The patient, [name], is a 68 y.o. female born on August 20, 1950, is here for treatment for further treatment after a mastectomy and placement of a tissue expander. She now presents for exchange of her expander for an implant.  She requires capsulotomies to better position the implant. MRN: [id number]  CONSENT:  Informed consent was obtained directly from the patient. Risks, benefits and alternatives were fully discussed. Specific risks including but not limited to bleeding, infection, hematoma, seroma, scarring, pain, implant infection, implant extrusion, capsular contracture, asymmetry, wound healing problems, and need for further surgery were all discussed. The patient did have an ample opportunity to have her questions answered to her satisfaction.   DESCRIPTION OF PROCEDURE:  The patient was taken to the operating room. SCDs were placed and IV antibiotics were given. The patient's chest was prepped and draped in a sterile fashion. A time out was performed and the implants to be used were identified.  One percent Xylocaine with epinephrine was used to  infiltrate the area.   The old mastectomy scar was excised and superior mastectomy and inferior mastectomy flaps were re-raised over the pectoralis major muscle. The capsule was split to expose the implant which was removed. Inspection of the pocket showed a normal healthy capsule.  A lateral capsulotomy was performed with a 2 x 7 cm excision to allow for breast pocket repositioning more medially. Measurements were made to confirm adequate pocket size for the implant dimensions.  Hemostasis was ensured with electrocautery.  The pocket was irrigated with antibiotic solution.  New gloves were placed.  The implant was placed in the pocket and oriented appropriately. The pectoralis major muscle and capsule on the anterior surface were re-closed with a 3-0 running Monocryl suture. There was excess fat and soft tissue laterally 2 x 4 cm excised for better contour. The remaining skin was closed with 4-0 Monocryl deep dermal and 5-0 Monocryl subcuticular stitches.  Dermabond was applied.    Left side: Preoperative markings were confirmed.  Incision lines were injected with 1% Xylocaine with epinephrine.  After waiting for vasoconstriction, the marked lines were incised.  The vertical scar area was addressed with excision of the inferior middle aspect with the bovie. Care was taken to not undermine the previous breast pedicle. Hemostasis was achieved.  The pocket was irrigated and hemostasis confirmed.  The deep tissues were approximated with 3-0 monocryl sutures at the vertical limb and the skin was closed with deep dermal and subcuticular 4-0 Monocryl sutures followed by 5-0 Monocryl.  The patient was sat upright and size and shape symmetry was confirmed. A #10 blade was used to make an incision at the 12 o'clock position for 2 cm at the previous scar.  The blade was used to trim the scar tissue and fat necrosis.  This was improved.  I could not remove all of it as it is the NAC pedical base.  Dermabond was applied.   A breast binder and ABDs were placed.  The nipple and skin flaps had good capillary refill at the end of the procedure.  The patient tolerated the procedure well. The patient was allowed to wake from anesthesia and taken to the recovery room in satisfactory condition

## 2018-02-17 NOTE — Discharge Instructions (Addendum)
INSTRUCTIONS FOR AFTER BREAST SURGERY ° ° °You are getting ready to undergo breast surgery.  You will likely have some questions about what to expect following your operation.  The following information will help you and your family understand what to expect when you are discharged from the hospital.  Following these guidelines will help ensure a smooth recovery and reduce risks of complications.   °Postoperative instructions include information on: diet, wound care, medications and physical activity. ° °AFTER SURGERY °Expect to go home after the procedure.  In some cases, you may need to spend one night in the hospital for observation. ° °DIET °Breast surgery does not require a specific diet.  However, I have to mention that the healthier you eat the better your body can start healing. It is important to increasing your protein intake.  This means limiting the foods with sugar and carbohydrates.  Focus on vegetables and some meat.  If you have any liposuction during your procedure be sure to drink water.  If your urine is bright yellow, then it is concentrated, and you need to drink more water.  As a general rule after surgery, you should have 8 ounces of water every hour while awake.  If you find you are persistently nauseated or unable to take in liquids let us know.  NO TOBACCO USE or EXPOSURE.  This will slow your healing process and increase the risk of a wound. ° °WOUND CARE °You can shower the day after surgery if you don't have a drain.  Use fragrance free soap.  Dial, Dove and Ivory are usually mild on the skin. If you have a drain clean with baby wipes until the drain is removed.  If you have steri-strips / tape directly attached to your skin leave them in place. It is OK to get these wet.  No baths, pools or hot tubs for two weeks. °We close your incision to leave the smallest and best-looking scar. No ointment or creams on your incisions until given the go ahead.  Especially not Neosporin (Too many skin  reactions with this one).  A few weeks after surgery you can use Mederma and start massaging the scar. °We ask you to wear your binder or sports bra for the first 6 weeks around the clock, including while sleeping. This provides added comfort and helps reduce the fluid accumulation at the surgery site. ° °ACTIVITY °No heavy lifting until cleared by the doctor.  This usually means no more than a half-gallon of milk.  It is OK to walk and climb stairs. In fact, moving your legs is very important to decrease your risk of a blood clot.  It will also help keep you from getting deconditioned.  Every 1 to 2 hours get up and walk for 5 minutes. This will help with a quicker recovery back to normal.  Let pain be your guide so you don't do too much.  NO, you cannot do the spring cleaning and don't plan on taking care of anyone else.  This is your time for TLC.  °You will be more comfortable if you sleep and rest with your head elevated either with a few pillows under you or in a recliner.  No stomach sleeping for a few months. ° °WORK °Everyone returns to work at different times. As a rough guide, most people take at least 1 - 2 weeks off prior to returning to work. If you need documentation for your job, bring the forms to your postoperative follow   up visit.  DRIVING Arrange for someone to bring you home from the hospital.  You may be able to drive a few days after surgery but not while taking any narcotics or valium.  BOWEL MOVEMENTS Constipation can occur after anesthesia and while taking pain medication.  It is important to stay ahead for your comfort.  We recommend taking Milk of Magnesia (2 tablespoons; twice a day) while taking the pain pills.  SEROMA This is fluid your body tried to put in the surgical site.  This is normal but if it creates tight skinny skin let us know.  It usually decreases in a few weeks.  WHEN TO CALL Call your surgeon's office if any of the following occur:  Fever 101 degrees F or  greater  Excessive bleeding or fluid from the incision site.  Pain that increases over time without aid from the medications  Redness, warmth, or pus draining from incision sites  Persistent nausea or inability to take in liquids  Severe misshapen area that underwent the operation.  Here are some resources:  1. Plastic surgery website: https://www.plasticsurgery.org/for-medical-professionals/education-and-resources/publications/breast-reconstruction-magazine 2. Breast Reconstruction Awareness Campaign:  [employer]elLives.co.nz 3. Plastic surgery Implant information:  https://www.plasticsurgery.org/patient-safety/breast-implant-safety   AMBULATORY SURGERY  DISCHARGE INSTRUCTIONS   1) The drugs that you were given will stay in your system until tomorrow so for the next 24 hours you should not:  A) Drive an automobile B) Make any legal decisions C) Drink any alcoholic beverage   2) You may resume regular meals tomorrow.  Today it is better to start with liquids and gradually work up to solid foods.  You may eat anything you prefer, but it is better to start with liquids, then soup and crackers, and gradually work up to solid foods.   3) Please notify your doctor immediately if you have any unusual bleeding, trouble breathing, redness and pain at the surgery site, drainage, fever, or pain not relieved by medication.    4) Additional Instructions:        Please contact your physician with any problems or Same Day Surgery at [phone], Monday through Friday 6 am to 4 pm, or [place] at [facility] number at [phone].

## 2018-02-17 NOTE — Anesthesia Preprocedure Evaluation (Signed)
Anesthesia Evaluation  Patient identified by MRN, date of birth, ID band Patient awake    Reviewed: Allergy & Precautions, NPO status , Patient's Chart, lab work & pertinent test results  History of Anesthesia Complications (+) PONV and history of anesthetic complications  Airway Mallampati: III  TM Distance: <3 FB Neck ROM: Full    Dental  (+) Implants   Pulmonary neg pulmonary ROS, neg sleep apnea, neg COPD,    breath sounds clear to auscultation- rhonchi (-) wheezing      Cardiovascular Exercise Tolerance: Good (-) hypertension(-) CAD, (-) Past MI, (-) Cardiac Stents and (-) CABG  Rhythm:Regular Rate:Normal - Systolic murmurs and - Diastolic murmurs    Neuro/Psych negative neurological ROS  negative psych ROS   GI/Hepatic GERD  ,  Endo/Other  negative endocrine ROSneg diabetes  Renal/GU negative Renal ROS     Musculoskeletal  (+) Arthritis ,   Abdominal (+) - obese,   Peds  Hematology negative hematology ROS (+)   Anesthesia Other Findings Past Medical History: No date: Arthritis 02/2014: Breast cancer ([place]) No date: Cancer ([place])     Comment:  Vulvular No date: GERD (gastroesophageal reflux disease) No date: Hepatitis     Comment:  Hep B in 1981 No date: PONV (postoperative nausea and vomiting)     Comment:  pt states scopalamine patch helped and zofran and               phenergan helped   Reproductive/Obstetrics                             Anesthesia Physical Anesthesia Plan  ASA: II  Anesthesia Plan: General   Post-op Pain Management:  Regional for Post-op pain   Induction: Intravenous  PONV Risk Score and Plan:   Airway Management Planned: Oral ETT  Additional Equipment:   Intra-op Plan:   Post-operative Plan: Extubation in OR  Informed Consent: I have reviewed the patients History and Physical, chart, labs and discussed the procedure including the risks,  benefits and alternatives for the proposed anesthesia with the patient or authorized representative who has indicated his/her understanding and acceptance.   Dental advisory given  Plan Discussed with: CRNA and Anesthesiologist  Anesthesia Plan Comments: (Discussed possible post op PECS block if pain uncontrolled)        Anesthesia Quick Evaluation

## 2018-02-17 NOTE — Transfer of Care (Signed)
Immediate Anesthesia Transfer of Care Note  Patient: [name]  Procedure(s) Performed: Left breast reduction with excision of fat necrosis-no liposuction needed (Left ) PLACEMENT OF BREAST IMPLANTS (Right )  Patient Location: PACU  Anesthesia Type:General  Level of Consciousness: drowsy  Airway & Oxygen Therapy: Patient Spontanous Breathing and Patient connected to face mask oxygen  Post-op Assessment: Report given to RN and Post -op Vital signs reviewed and stable  Post vital signs: Reviewed  Last Vitals:  Vitals Value Taken Time  BP 105/51 02/17/2018 10:22 AM  Temp    Pulse 73 02/17/2018 10:23 AM  Resp 27 02/17/2018 10:23 AM  SpO2 98 % 02/17/2018 10:23 AM  Vitals shown include unvalidated device data.  Last Pain:  Vitals:   02/17/18 0624  TempSrc: Tympanic  PainSc: 0-No pain         Complications: No apparent anesthesia complications

## 2018-02-17 NOTE — Interval H&P Note (Signed)
History and Physical Interval Note:  02/17/2018 7:27 AM  [name]  has presented today for surgery, with the diagnosis of Breast Asymmetry Between Native Breast And Reconstructed Breast  History of breast cancer in female; History of reduction surgery of left breast; History of reconstruction of right breast  The various methods of treatment have been discussed with the patient and family. After consideration of risks, benefits and other options for treatment, the patient has consented to  Procedure(s): Left breast reduction with excision of fat necrosis-no liposuction needed (Left) PLACEMENT OF BREAST IMPLANTS (Right) as a surgical intervention .  The patient's history has been reviewed, patient examined, no change in status, stable for surgery.  I have reviewed the patient's chart and labs.  Questions were answered to the patient's satisfaction.     Loel Lofty Dillingham

## 2018-02-17 NOTE — Anesthesia Post-op Follow-up Note (Signed)
Anesthesia QCDR form completed.        

## 2018-02-18 ENCOUNTER — Encounter: Payer: Self-pay | Admitting: Plastic Surgery

## 2018-02-19 LAB — SURGICAL PATHOLOGY

## 2018-02-19 NOTE — Anesthesia Postprocedure Evaluation (Addendum)
Anesthesia Post Note  Patient: [name]  Procedure(s) Performed: Left breast reduction with excision of fat necrosis-no liposuction needed (Left ) PLACEMENT OF BREAST IMPLANTS (Right )  Patient location during evaluation: PACU Anesthesia Type: General Level of consciousness: awake and alert and oriented Pain management: pain level controlled Vital Signs Assessment: post-procedure vital signs reviewed and stable Respiratory status: spontaneous breathing, nonlabored ventilation and respiratory function stable Cardiovascular status: blood pressure returned to baseline and stable Postop Assessment: no signs of nausea or vomiting Anesthetic complications: no     Last Vitals:  Vitals:   02/17/18 1138 02/17/18 1216  BP: 140/66 124/61  Pulse: 88 85  Resp: 18 18  Temp: (!) 36.3 C   SpO2: 96% 99%    Last Pain:  Vitals:   02/18/18 0934  TempSrc:   PainSc: 3                  [name]

## 2018-02-23 ENCOUNTER — Ambulatory Visit (INDEPENDENT_AMBULATORY_CARE_PROVIDER_SITE_OTHER): Payer: 59 | Admitting: Physician Assistant

## 2018-02-23 ENCOUNTER — Encounter: Payer: Self-pay | Admitting: Physician Assistant

## 2018-02-23 VITALS — BP 140/80 | HR 81 | Ht 66.0 in | Wt 167.5 lb

## 2018-02-23 DIAGNOSIS — Z9889 Other specified postprocedural states: Secondary | ICD-10-CM

## 2018-02-23 DIAGNOSIS — N651 Disproportion of reconstructed breast: Secondary | ICD-10-CM

## 2018-02-23 DIAGNOSIS — H5213 Myopia, bilateral: Secondary | ICD-10-CM | POA: Diagnosis not present

## 2018-02-23 NOTE — Progress Notes (Signed)
  Subjective:     Patient ID: [name], female   DOB: [birth year], 67 y.o.   MRN: [id number]  HPI Very pleasant white female patient presents to the clinic status post breast reconstruction.  On 02/17/2018 Dr. Marla Roe removed the right breast implant,  Capsulotomy was performed for implant repositioning on the right.  Left breast reduction for symmetry was also performed.  There is still a significant amount of edema of the left breast.  Today in clinic there was some asymmetry noted but we do feel like that will continue to improve.  Incisions are healing well no erythema or discharge is noted no seroma or hematoma noted.  Review of Systems  Constitutional: Negative.   Respiratory: Negative.   Cardiovascular: Negative.   Gastrointestinal: Negative.   Skin: Positive for wound.  Neurological: Negative.   Psychiatric/Behavioral: Negative.        Objective:   Physical Exam  Constitutional: She is oriented to person, place, and time. She appears well-developed and well-nourished.  Pulmonary/Chest: Effort normal.  Abdominal: Soft.  Neurological: She is alert and oriented to person, place, and time.  Skin: Skin is warm and dry.  Psychiatric: She has a normal mood and affect. Her behavior is normal. Judgment and thought content normal.  incisions are healing well No seroma noted No discharge or erythema     Assessment:     S/p breast reconstruction    Plan:     Patient advised to continue to wear the binder or a sports preop bra.  Patient will continue to note a decrease in swelling of the left side.  Patient advised to increase protein in diet to aid in healing.  Patient will return to clinic in 2 weeks.

## 2018-03-16 ENCOUNTER — Encounter: Payer: Self-pay | Admitting: Plastic Surgery

## 2018-03-16 ENCOUNTER — Ambulatory Visit: Payer: 59 | Admitting: Plastic Surgery

## 2018-03-16 ENCOUNTER — Ambulatory Visit (INDEPENDENT_AMBULATORY_CARE_PROVIDER_SITE_OTHER): Payer: 59 | Admitting: Plastic Surgery

## 2018-03-16 VITALS — BP 137/75 | HR 70 | Temp 98.2°F | Ht 66.0 in | Wt 166.0 lb

## 2018-03-16 DIAGNOSIS — Z9011 Acquired absence of right breast and nipple: Secondary | ICD-10-CM

## 2018-03-16 DIAGNOSIS — N651 Disproportion of reconstructed breast: Secondary | ICD-10-CM

## 2018-03-16 NOTE — Progress Notes (Signed)
   Subjective:    Patient ID: [name], female    DOB: [birth year], 67 y.o.   MRN: [id number]  [name] is a 67 year old female here for follow-up on her right breast reconstruction and left breast reduction for symmetry.  The swelling and bruising has resolved.  The Steri-Strips were removed from the left breast by the patient.  The incisions are healing well.  There is no sign of hematoma, seroma or infection.   Review of Systems  Constitutional: Negative.   HENT: Negative.   Eyes: Negative.   Respiratory: Negative.   Gastrointestinal: Negative.   Endocrine: Negative.   Genitourinary: Negative.   Skin: Negative.        Objective:   Physical Exam Vitals signs and nursing note reviewed.  Constitutional:      Appearance: Normal appearance.  HENT:     Head: Normocephalic.  Cardiovascular:     Rate and Rhythm: Normal rate.  Pulmonary:     Effort: Pulmonary effort is normal.  Neurological:     General: No focal deficit present.     Mental Status: She is alert.  Psychiatric:        Mood and Affect: Mood normal.        Thought Content: Thought content normal.        Judgment: Judgment normal.       Assessment & Plan:  Breast asymmetry following reconstructive surgery  Acquired absence of right breast  May resume full activity.  May return to work.  Note note given.  Follow-up in 1 year.

## 2018-03-24 ENCOUNTER — Ambulatory Visit: Payer: 59 | Admitting: Hematology and Oncology

## 2018-03-24 ENCOUNTER — Other Ambulatory Visit: Payer: 59

## 2018-03-31 ENCOUNTER — Other Ambulatory Visit: Payer: 59

## 2018-03-31 ENCOUNTER — Ambulatory Visit: Payer: 59 | Admitting: Hematology and Oncology

## 2018-04-01 ENCOUNTER — Inpatient Hospital Stay: Payer: 59 | Attending: Hematology and Oncology

## 2018-04-01 ENCOUNTER — Inpatient Hospital Stay (HOSPITAL_BASED_OUTPATIENT_CLINIC_OR_DEPARTMENT_OTHER): Payer: 59 | Admitting: Hematology and Oncology

## 2018-04-01 ENCOUNTER — Encounter: Payer: Self-pay | Admitting: Hematology and Oncology

## 2018-04-01 VITALS — BP 145/81 | HR 74 | Temp 97.9°F | Resp 18 | Ht 66.0 in | Wt 161.6 lb

## 2018-04-01 DIAGNOSIS — Z853 Personal history of malignant neoplasm of breast: Secondary | ICD-10-CM | POA: Insufficient documentation

## 2018-04-01 DIAGNOSIS — Z8589 Personal history of malignant neoplasm of other organs and systems: Secondary | ICD-10-CM | POA: Insufficient documentation

## 2018-04-01 DIAGNOSIS — C50911 Malignant neoplasm of unspecified site of right female breast: Secondary | ICD-10-CM

## 2018-04-01 LAB — CBC WITH DIFFERENTIAL/PLATELET
Abs Immature Granulocytes: 0.02 10*3/uL (ref 0.00–0.07)
Basophils Absolute: 0.1 10*3/uL (ref 0.0–0.1)
Basophils Relative: 1 %
Eosinophils Absolute: 0.1 10*3/uL (ref 0.0–0.5)
Eosinophils Relative: 2 %
HCT: 43.7 % (ref 36.0–46.0)
Hemoglobin: 14.3 g/dL (ref 12.0–15.0)
Immature Granulocytes: 0 %
Lymphocytes Relative: 20 %
Lymphs Abs: 1.2 10*3/uL (ref 0.7–4.0)
MCH: 31.2 pg (ref 26.0–34.0)
MCHC: 32.7 g/dL (ref 30.0–36.0)
MCV: 95.2 fL (ref 80.0–100.0)
Monocytes Absolute: 0.8 10*3/uL (ref 0.1–1.0)
Monocytes Relative: 13 %
Neutro Abs: 3.9 10*3/uL (ref 1.7–7.7)
Neutrophils Relative %: 64 %
Platelets: 272 10*3/uL (ref 150–400)
RBC: 4.59 MIL/uL (ref 3.87–5.11)
RDW: 13.1 % (ref 11.5–15.5)
WBC: 6.1 10*3/uL (ref 4.0–10.5)
nRBC: 0 % (ref 0.0–0.2)

## 2018-04-01 LAB — COMPREHENSIVE METABOLIC PANEL
ALT: 23 U/L (ref 0–44)
AST: 27 U/L (ref 15–41)
Albumin: 4 g/dL (ref 3.5–5.0)
Alkaline Phosphatase: 64 U/L (ref 38–126)
Anion gap: 7 (ref 5–15)
BUN: 17 mg/dL (ref 8–23)
CO2: 28 mmol/L (ref 22–32)
Calcium: 9.2 mg/dL (ref 8.9–10.3)
Chloride: 105 mmol/L (ref 98–111)
Creatinine, Ser: 0.73 mg/dL (ref 0.44–1.00)
GFR calc Af Amer: >60 mL/min (ref >60)
GFR calc non Af Amer: >60 mL/min (ref >60)
Glucose, Bld: 103 mg/dL — ABNORMAL HIGH (ref 70–99)
Potassium: 4.7 mmol/L (ref 3.5–5.1)
Sodium: 140 mmol/L (ref 135–145)
Total Bilirubin: 0.6 mg/dL (ref 0.3–1.2)
Total Protein: 7.1 g/dL (ref 6.5–8.1)

## 2018-04-01 NOTE — Progress Notes (Signed)
[facility] day:  04/01/18   Chief Complaint: [name] is a 68 y.o. female with a history of stage IA right breast cancer who is seen for 8 month assessment.    HPI: The patient was last seen in the medical oncology clinic on 07/22/2017.  At that time,  she was doing well.  She had ongoing vasomotor symptoms. She declined Effexor.  She noted pain from RIGHT breast implant.  She planned on having it removed. Exam revealed stable post-operative changes.  Left screening mammogram on 12/15/2017 revealed no evidence of malignancy.  During the interim, patient is doing well. She notes that she just had "breast reconstruction...reconstruction" in December 2019 with Dr. Audelia Hives. Area of fat necrosis within her breast was removed. Patient is off of her endocrine therapy, which has resolved her arthralgias. Patient states, "I feel like a new person off of that medication. I do not want to go back on it. It has been 5 years".   Patient denies that she has experienced any B symptoms. She denies any interval infections.  Patient does not verbalize any concerns with regards to her breasts today. Patient performs monthly self breast examinations as recommended. Vasomotor symptoms persist, however have shown considerable improvement.   Patient advises that she maintains an adequate appetite. She is eating well. Weight today is 161 lb 9.6 oz (73.3 kg), which compared to her last visit to the clinic, represents an 8 pound decrease. She is on Weight Watchers in efforts to intentionally lose weight.   Patient denies pain in the clinic today.   Past Medical History:  Diagnosis Date  . Arthritis   . Breast cancer ([place]) 02/2014  . Cancer ([place])    Vulvular  . GERD (gastroesophageal reflux disease)   . Hepatitis    Hep B in 1981  . PONV (postoperative nausea and vomiting)    pt states scopalamine patch helped and zofran and phenergan helped    Past  Surgical History:  Procedure Laterality Date  . ABDOMINAL HYSTERECTOMY  1997  . AUGMENTATION MAMMAPLASTY Right 46/2703   Silicone  . BREAST BIOPSY Right    Positive  . BREAST REDUCTION SURGERY Left 01/18/2015   Procedure: MAMMARY REDUCTION  (BREAST);  Surgeon: Nicholaus Bloom, MD;  Location: [facility];  Service: Plastics;  Laterality: Left;  . BREAST REDUCTION SURGERY Left 02/17/2018   Procedure: Left breast reduction with excision of fat necrosis-no liposuction needed;  Surgeon: Wallace Going, DO;  Location: [facility];  Service: Plastics;  Laterality: Left;  . PLACEMENT OF BREAST IMPLANTS Right 02/17/2018   Procedure: PLACEMENT OF BREAST IMPLANTS;  Surgeon: Wallace Going, DO;  Location: [facility];  Service: Plastics;  Laterality: Right;  . REDUCTION MAMMAPLASTY Left 01/2015  . TISSUE EXPANDER PLACEMENT Right 01/18/2015   Procedure: Right  BREAST EXPANDER PLACEMENT WITH ALLODERM;  Surgeon: Nicholaus Bloom, MD;  Location: [facility];  Service: Plastics;  Laterality: Right;  DR COAN RM 2 PER BRENDA  . TISSUE EXPANDER PLACEMENT Right 03/13/2015   Procedure: Right breast tissue expander removal and Sillicone implant placement.;  Surgeon: Nicholaus Bloom, MD;  Location: [facility];  Service: Plastics;  Laterality: Right;  . TOTAL MASTECTOMY Right 02/2013   stage 1 ER/PR+  . VULVECTOMY      Family History  Problem Relation Age of Onset  . Hypertension Father   . Diabetes Father   . Stroke Father   . Diabetes Brother   .  Hypertension Mother   . Thyroid disease Mother   . Leukemia Maternal Grandmother   . Breast cancer Paternal Grandmother   . Breast cancer Cousin     Social History:  reports that she has never smoked. She has never used smokeless tobacco. She reports current alcohol use of about 5.0 standard drinks of alcohol per week. She reports that she does not use drugs.  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  She lives in [place].  The patient is  alone today.  Allergies:  Allergies  Allergen Reactions  . Penicillins Rash    Has patient had a PCN reaction causing immediate rash, facial/tongue/throat swelling, SOB or lightheadedness with hypotension: No Has patient had a PCN reaction causing severe rash involving mucus membranes or skin necrosis: No Has patient had a PCN reaction that required hospitalization: No Has patient had a PCN reaction occurring within the last 10 years: No If all of the above answers are "NO", then may proceed with Cephalosporin use.'  . Sulfa Antibiotics Rash    Current Medications: Current Outpatient Medications  Medication Sig Dispense Refill  . Calcium Carbonate-Vitamin D (CALCIUM + D PO) Take 1 tablet by mouth 2 (two) times daily.     Marland Kitchen esomeprazole (NEXIUM) 20 MG capsule Take 20 mg by mouth daily at 12 noon.    . Magnesium 500 MG TABS Take 500 mg by mouth daily.     . Omega 3 1000 MG CAPS Take 1,000 mg by mouth daily.     . Probiotic Product (PROBIOTIC DAILY PO) Take 1 capsule by mouth 2 (two) times daily.     . fexofenadine (ALLEGRA) 180 MG tablet Take 180 mg by mouth daily as needed for allergies.     Marland Kitchen ondansetron (ZOFRAN) 4 MG tablet Take 1 tablet (4 mg total) by mouth every 8 (eight) hours as needed for up to 10 doses for nausea or vomiting. (Patient not taking: Reported on 04/01/2018) 5 tablet 0  . triamcinolone (NASACORT ALLERGY 24HR) 55 MCG/ACT AERO nasal inhaler Place 2 sprays into the nose daily as needed (allergies).      No current facility-administered medications for this visit.     Review of Systems  Constitutional: Positive for weight loss (down 8 pounds in Weight Watchers). Negative for chills, diaphoresis, fever and malaise/fatigue.       Feels "good".  HENT: Negative.  Negative for congestion, nosebleeds and sore throat.   Eyes: Negative.  Negative for blurred vision, double vision, pain and discharge.  Respiratory: Negative for cough, hemoptysis, sputum production and  shortness of breath.   Cardiovascular: Negative for chest pain, palpitations, orthopnea, leg swelling and PND.  Gastrointestinal: Negative for abdominal pain, blood in stool, constipation, diarrhea, melena, nausea and vomiting.  Genitourinary: Negative for dysuria, frequency, hematuria and urgency.  Musculoskeletal: Negative for back pain, falls, joint pain (arthralgias resolved off endocrine therapy) and myalgias.  Skin: Negative for itching and rash.       S/p breast reconstruction.  Neurological: Negative for dizziness, tremors, weakness and headaches.  Endo/Heme/Allergies: Does not bruise/bleed easily.       Vasomotor symptoms, improved.   Psychiatric/Behavioral: Negative for depression, memory loss and suicidal ideas. The patient is not nervous/anxious and does not have insomnia.   All other systems reviewed and are negative.  PERFORMANCE STATUS (ECOG):  0  Physical Exam: Blood pressure (!) 145/81, pulse 74, temperature 97.9 F (36.6 C), temperature source Tympanic, resp. rate 18, height 5' 6" (1.676  m), weight 161 lb 9.6 oz (73.3 kg), SpO2 98 %. GENERAL:  Well developed, well nourished, woman sitting comfortably in the exam room in no acute distress. MENTAL STATUS:  Alert and oriented to person, place and time. HEAD:  Styled blonde hair.  Normocephalic, atraumatic, face symmetric, no Cushingoid features. EYES:  Glasses.  Blue eyes.  Pupils equal round and reactive to light and accomodation.  No conjunctivitis or scleral icterus. ENT:  Oropharynx clear without lesion.  Tongue normal. Mucous membranes moist.  RESPIRATORY:  Clear to auscultation without rales, wheezes or rhonchi. CARDIOVASCULAR:  Regular rate and rhythm without murmur, rub or gallop. BREAST:  Right breast with horizontal incision s/p implant and reconstruction.  No erythema or nodularity. Left breast with post-operative changes.  Scaring above nipple.   ABDOMEN:  Soft, non-tender, with active bowel sounds, and no  hepatosplenomegaly.  No masses. SKIN:  No rashes, ulcers or lesions. EXTREMITIES: No edema, no skin discoloration or tenderness.  No palpable cords. LYMPH NODES: No palpable cervical, supraclavicular, axillary or inguinal adenopathy  NEUROLOGICAL: Unremarkable. PSYCH:  Appropriate.    Appointment on 04/01/2018  Component Date Value Ref Range Status  . Sodium 04/01/2018 140  135 - 145 mmol/L Final  . Potassium 04/01/2018 4.7  3.5 - 5.1 mmol/L Final  . Chloride 04/01/2018 105  98 - 111 mmol/L Final  . CO2 04/01/2018 28  22 - 32 mmol/L Final  . Glucose, Bld 04/01/2018 103* 70 - 99 mg/dL Final  . BUN 04/01/2018 17  8 - 23 mg/dL Final  . Creatinine, Ser 04/01/2018 0.73  0.44 - 1.00 mg/dL Final  . Calcium 04/01/2018 9.2  8.9 - 10.3 mg/dL Final  . Total Protein 04/01/2018 7.1  6.5 - 8.1 g/dL Final  . Albumin 04/01/2018 4.0  3.5 - 5.0 g/dL Final  . AST 04/01/2018 27  15 - 41 U/L Final  . ALT 04/01/2018 23  0 - 44 U/L Final  . Alkaline Phosphatase 04/01/2018 64  38 - 126 U/L Final  . Total Bilirubin 04/01/2018 0.6  0.3 - 1.2 mg/dL Final  . GFR calc non Af Amer 04/01/2018 >60  >60 mL/min Final  . GFR calc Af Amer 04/01/2018 >60  >60 mL/min Final  . Anion gap 04/01/2018 7  5 - 15 Final   Performed at [facility], [address] [place], [place] [address]  . WBC 04/01/2018 6.1  4.0 - 10.5 K/uL Final  . RBC 04/01/2018 4.59  3.87 - 5.11 MIL/uL Final  . Hemoglobin 04/01/2018 14.3  12.0 - 15.0 g/dL Final  . HCT 04/01/2018 43.7  36.0 - 46.0 % Final  . MCV 04/01/2018 95.2  80.0 - 100.0 fL Final  . MCH 04/01/2018 31.2  26.0 - 34.0 pg Final  . MCHC 04/01/2018 32.7  30.0 - 36.0 g/dL Final  . RDW 04/01/2018 13.1  11.5 - 15.5 % Final  . Platelets 04/01/2018 272  150 - 400 K/uL Final  . nRBC 04/01/2018 0.0  0.0 - 0.2 % Final  . Neutrophils Relative % 04/01/2018 64  % Final  . Neutro Abs 04/01/2018 3.9  1.7 - 7.7 K/uL Final  . Lymphocytes Relative 04/01/2018 20  % Final  . Lymphs Abs  04/01/2018 1.2  0.7 - 4.0 K/uL Final  . Monocytes Relative 04/01/2018 13  % Final  . Monocytes Absolute 04/01/2018 0.8  0.1 - 1.0 K/uL Final  . Eosinophils Relative 04/01/2018 2  % Final  . Eosinophils Absolute 04/01/2018 0.1  0.0 -  0.5 K/uL Final  . Basophils Relative 04/01/2018 1  % Final  . Basophils Absolute 04/01/2018 0.1  0.0 - 0.1 K/uL Final  . Immature Granulocytes 04/01/2018 0  % Final  . Abs Immature Granulocytes 04/01/2018 0.02  0.00 - 0.07 K/uL Final   Performed at [facility] [address] [place], [place] [address]    Assessment:  ALIYANAH ROZAS is a 68 y.o. female with a stage IA right breast cancer status post mastectomy on 02/24/2013.  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 Sentinel lymph node was negative. Tumor was ER/PR positive and HER-2/neu negative.  She underwent right sided breast reconstruction and left sided breast reduction on 01/18/2015.  Left sided mammogram on 11/06/2014 revealed no evidence of malignancy.  Left sided diagnostic mammogram on 05/19/2016 revealed probable benign calcifications in the left breast from fat necrosis.   Diagnostic left mammogram on 11/24/2016 revealed no suspicious masses or concerning for malignancy in the left breast. There was a large area of evolving fat necrosis noted in the upper central and medial left breast.  Left screening mammogram on 12/15/2017 revealed no evidence of malignancy.  She is s/p 5 years of aromatase inhibitors (began 03/2013). She was initially on Femara, then Arimidex, but discontinued secondary to hot flashes and joint pain. She completed Aromasin.  Joint pain, hot flashes, and night sweats have improved off endocrine therapy.    CA27.29 has been followed: 22.6 on 09/20/2014, 25.3 on 02/28/2015, 20.8 on 10/03/2015, 22.6 on 05/28/2016, 22.4 on 12/03/2016, 19.6 on 07/16/2017, and 17.2 on 04/01/2018.  Bone density on 01/14/2017 was normal with a T-score of -0.7 in the AP spine L1-L4  and - 0.5 in the right femoral neck.  She takes calcium and vitamin D supplements.  She had a history of an abnormal PAP smear and is status post hysterectomy. 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 BRCA 1 and 2 testing was negative.   Symptomatically, she is doing well.  She feels better s/p completion of hormonal therapy.  Plan: 1.  Labs today:  CBC with diff, CMP, CA27.29. 2.  Stage IA right breast cancer  Clinically doing well without evidence of recurrent disease.  Patient is s/p mastectomy.  Patient is s/p 5 years of aromatase inhibitors.  Discuss consideration of BCI testing (benefit of extended adjuvant hormonal therapy).  Patient declines.  Discuss plan for monthly self exam and yearly follow-up.  Anticipate next mammogram on 12/16/2018.  Discuss follow-up in the oncology clinic or with Dr. Army Melia.  Patient would like to follow-up with Dr. Army Melia and return if any concerns. 3.   Health maintenance  Continue yearly bone density- next due 01/15/2019. 4.   RTC prn.   Honor Loh, NP  04/01/2018, 11:08 AM   I saw and evaluated the patient, participating in the key portions of the service and reviewing pertinent diagnostic studies and records.  I reviewed the nurse practitioner's note and agree with the findings and the plan.  The assessment and plan were discussed with the patient.  Several questions were asked by the patient and answered.   Honor Loh, NP  04/01/2018,11:08 AM

## 2018-04-01 NOTE — Progress Notes (Signed)
No new changes noted today 

## 2018-04-02 LAB — CANCER ANTIGEN 27.29: CA 27.29: 17.2 U/mL (ref 0.0–38.6)

## 2018-04-08 ENCOUNTER — Encounter: Payer: Self-pay | Admitting: Plastic Surgery

## 2018-06-16 ENCOUNTER — Telehealth: Payer: Self-pay | Admitting: Plastic Surgery

## 2018-06-16 NOTE — Telephone Encounter (Signed)
Patient called stating that she has puss coming out of a wound where reconstruction was done at the end of December. Patient stated that the area opened at the end of last week, but has gotten worse throughout the week and feels that she needs an antibiotic. Patient requesting phone call at this time and does not wish to come in due to [place]. Please advise.

## 2018-06-17 ENCOUNTER — Ambulatory Visit (INDEPENDENT_AMBULATORY_CARE_PROVIDER_SITE_OTHER): Payer: 59 | Admitting: Plastic Surgery

## 2018-06-17 ENCOUNTER — Encounter: Payer: Self-pay | Admitting: Plastic Surgery

## 2018-06-17 ENCOUNTER — Other Ambulatory Visit: Payer: Self-pay

## 2018-06-17 DIAGNOSIS — N651 Disproportion of reconstructed breast: Secondary | ICD-10-CM

## 2018-06-17 DIAGNOSIS — Z9011 Acquired absence of right breast and nipple: Secondary | ICD-10-CM

## 2018-06-17 MED ORDER — DOXYCYCLINE HYCLATE 100 MG PO TABS: 100.0000 mg | ORAL_TABLET | Freq: Two times a day (BID) | ORAL | 0 refills | Status: AC

## 2018-06-17 NOTE — Telephone Encounter (Signed)
Call to pt regarding her concern- c/o a small open area on top of her Left nipple She reports that the open area is "pen top size" Denies any fever, redness, no change in skin temp, no tunnel identified The area is slightly tender. There has been small amount of "pus" drainage from the opening & she is able to manually express drainage herself Pt requests a call/webex visit with Dr. Marla Roe Dr. Marla Roe will call her today  [place]

## 2018-06-17 NOTE — Progress Notes (Signed)
The patient is a 68 year old female who called with concerns about her left breast.  She underwent surgery about a month ago.  Since fat necrosis was excised from the left breast.  Over the last day or 2 she has noticed some redness and what looks like some pus coming from the incision site.  There is a little bit of redness I was able to see via face time.  Also a little bit of swelling.  She denies any fevers and she has been checking regularly.  Does not appear she has cellulitis.  I will send in a prescription for doxycycline which she states she can take.  The visit was via face time and was certainly consented by the patient for telemedicine visit.

## 2018-06-22 ENCOUNTER — Telehealth: Payer: Self-pay | Admitting: Plastic Surgery

## 2018-06-22 NOTE — Telephone Encounter (Signed)
Patient called to give update from tele-visit last week. Patient said condition is better than it was. She is having less drainage that is now clear. Has been taking antibiotic since Friday without any complications. Patient stated she would call back if there were any changes.

## 2018-07-20 ENCOUNTER — Encounter: Payer: 59 | Admitting: Internal Medicine

## 2018-09-20 ENCOUNTER — Encounter: Payer: 59 | Admitting: Internal Medicine

## 2018-11-17 ENCOUNTER — Encounter: Payer: Self-pay | Admitting: Internal Medicine

## 2018-11-17 ENCOUNTER — Ambulatory Visit (INDEPENDENT_AMBULATORY_CARE_PROVIDER_SITE_OTHER): Payer: 59 | Admitting: Internal Medicine

## 2018-11-17 ENCOUNTER — Other Ambulatory Visit: Payer: Self-pay

## 2018-11-17 VITALS — BP 112/78 | HR 84 | Resp 16 | Ht 66.0 in | Wt 159.0 lb

## 2018-11-17 DIAGNOSIS — C50911 Malignant neoplasm of unspecified site of right female breast: Secondary | ICD-10-CM

## 2018-11-17 DIAGNOSIS — Z1231 Encounter for screening mammogram for malignant neoplasm of breast: Secondary | ICD-10-CM | POA: Diagnosis not present

## 2018-11-17 DIAGNOSIS — Z23 Encounter for immunization: Secondary | ICD-10-CM

## 2018-11-17 DIAGNOSIS — E785 Hyperlipidemia, unspecified: Secondary | ICD-10-CM

## 2018-11-17 DIAGNOSIS — Z0000 Encounter for general adult medical examination without abnormal findings: Secondary | ICD-10-CM | POA: Diagnosis not present

## 2018-11-17 DIAGNOSIS — K219 Gastro-esophageal reflux disease without esophagitis: Secondary | ICD-10-CM | POA: Diagnosis not present

## 2018-11-17 DIAGNOSIS — M7071 Other bursitis of hip, right hip: Secondary | ICD-10-CM

## 2018-11-17 LAB — POCT URINALYSIS DIPSTICK
Bilirubin, UA: NEGATIVE
Blood, UA: NEGATIVE
Glucose, UA: NEGATIVE
Ketones, UA: NEGATIVE
Nitrite, UA: NEGATIVE
Protein, UA: NEGATIVE
Spec Grav, UA: 1.015 (ref 1.010–1.025)
Urobilinogen, UA: 0.2 E.U./dL
pH, UA: 6 (ref 5.0–8.0)

## 2018-11-17 MED ORDER — TRAMADOL HCL 50 MG PO TABS: 50.0000 mg | ORAL_TABLET | Freq: Four times a day (QID) | ORAL | 0 refills | Status: DC | PRN

## 2018-11-17 MED ORDER — PREDNISONE 10 MG PO TABS: 10.0000 mg | ORAL_TABLET | ORAL | 0 refills | Status: AC

## 2018-11-17 MED ORDER — SHINGRIX 50 MCG/0.5ML IM SUSR: 0.5000 mL | Freq: Once | INTRAMUSCULAR | 1 refills | Status: AC

## 2018-11-17 NOTE — Progress Notes (Signed)
Date:  11/17/2018   Name:  [name]   DOB:  November 25, 1950   MRN:  [id number]   Chief Complaint: Annual Exam [name] is a 68 y.o. female who presents today for her Complete Annual Exam. She feels fairly well. She reports exercising none now due to heat. She reports she is sleeping fairly well. She denies breast issues.  Colonoscopy  2015 Left Mammogram  12/2017 Due for Prevnar-13  Gastroesophageal Reflux She complains of heartburn. She reports no abdominal pain, no chest pain, no coughing or no wheezing. The problem occurs occasionally. Pertinent negatives include no fatigue. She has tried a PPI for the symptoms. The treatment provided significant relief.   Breast Cancer - s/p right mastectomy and reconstruction.  Due for left mammogram.  She denies issues.  Vulvar cancer s/p total hysterectomy.  No complaints or evidence of recurrence - released from Oncology follow up.  Lab Results  Component Value Date   WBC 6.1 04/01/2018   HGB 14.3 04/01/2018   HCT 43.7 04/01/2018   MCV 95.2 04/01/2018   PLT 272 04/01/2018   Lab Results  Component Value Date   LABCA2 22.6 05/28/2016   Lab Results  Component Value Date   CREATININE 0.73 04/01/2018   BUN 17 04/01/2018   NA 140 04/01/2018   K 4.7 04/01/2018   CL 105 04/01/2018   CO2 28 04/01/2018    Review of Systems  Constitutional: Negative for chills, fatigue and fever.  HENT: Negative for congestion, hearing loss, tinnitus, trouble swallowing and voice change.   Eyes: Negative for visual disturbance.  Respiratory: Negative for cough, chest tightness, shortness of breath and wheezing.   Cardiovascular: Negative for chest pain, palpitations and leg swelling.  Gastrointestinal: Positive for heartburn. Negative for abdominal pain, constipation, diarrhea and vomiting.  Endocrine: Negative for polydipsia and polyuria.  Genitourinary: Negative for dysuria, frequency, genital sores, vaginal bleeding and vaginal discharge.   Musculoskeletal: Positive for arthralgias (right hip pain laterally). Negative for gait problem and joint swelling.  Skin: Negative for color change and rash.  Neurological: Negative for dizziness, tremors, light-headedness and headaches.  Hematological: Negative for adenopathy. Does not bruise/bleed easily.  Psychiatric/Behavioral: Negative for dysphoric mood and sleep disturbance. The patient is not nervous/anxious.     Patient Active Problem List   Diagnosis Date Noted  . Gastroesophageal reflux disease 11/17/2018  . Acquired absence of breast 02/09/2018  . Breast asymmetry following reconstructive surgery 02/09/2018  . Hot flashes 05/28/2016  . Environmental and seasonal allergies 12/18/2014  . Carcinoma of vulva ([place]) 12/18/2014  . Alopecia 12/18/2014  . Edema extremities 12/18/2014  . History of hepatitis B virus infection 12/18/2014  . Malignant neoplasm of breast ([place]) 12/18/2014  . Hyperlipidemia, mild 12/18/2014    Allergies  Allergen Reactions  . Penicillins Rash    Has patient had a PCN reaction causing immediate rash, facial/tongue/throat swelling, SOB or lightheadedness with hypotension: No Has patient had a PCN reaction causing severe rash involving mucus membranes or skin necrosis: No Has patient had a PCN reaction that required hospitalization: No Has patient had a PCN reaction occurring within the last 10 years: No If all of the above answers are "NO", then may proceed with Cephalosporin use.'  . Sulfa Antibiotics Rash    Past Surgical History:  Procedure Laterality Date  . ABDOMINAL HYSTERECTOMY  1997  . AUGMENTATION MAMMAPLASTY Right 123XX123   Silicone  . BREAST BIOPSY Right    Positive  . BREAST REDUCTION SURGERY  Left 01/18/2015   Procedure: MAMMARY REDUCTION  (BREAST);  Surgeon: Nicholaus Bloom, MD;  Location: [place];  Service: Plastics;  Laterality: Left;  . BREAST REDUCTION SURGERY Left 02/17/2018   Procedure: Left breast reduction with  excision of fat necrosis-no liposuction needed;  Surgeon: Wallace Going, DO;  Location: [facility];  Service: Plastics;  Laterality: Left;  . PLACEMENT OF BREAST IMPLANTS Right 02/17/2018   Procedure: PLACEMENT OF BREAST IMPLANTS;  Surgeon: Wallace Going, DO;  Location: [facility];  Service: Plastics;  Laterality: Right;  . TISSUE EXPANDER PLACEMENT Right 01/18/2015   Procedure: Right  BREAST EXPANDER PLACEMENT WITH ALLODERM;  Surgeon: Nicholaus Bloom, MD;  Location: [place];  Service: Plastics;  Laterality: Right;  DR COAN RM 2 PER BRENDA  . TISSUE EXPANDER PLACEMENT Right 03/13/2015   Procedure: Right breast tissue expander removal and Sillicone implant placement.;  Surgeon: Nicholaus Bloom, MD;  Location: [facility];  Service: Plastics;  Laterality: Right;  . TOTAL MASTECTOMY Right 02/2013   stage 1 ER/PR+  . VULVECTOMY      Social History   Tobacco Use  . Smoking status: Never Smoker  . Smokeless tobacco: Never Used  Substance Use Topics  . Alcohol use: Yes    Alcohol/week: 5.0 standard drinks    Types: 5 Glasses of wine per week  . Drug use: No     Medication list has been reviewed and updated.  Current Meds  Medication Sig  . Calcium Carbonate-Vitamin D (CALCIUM + D PO) Take 1 tablet by mouth 2 (two) times daily.   Marland Kitchen esomeprazole (NEXIUM) 20 MG capsule Take 20 mg by mouth daily at 12 noon.  . fexofenadine (ALLEGRA) 180 MG tablet Take 180 mg by mouth daily as needed for allergies.   . Magnesium 500 MG TABS Take 500 mg by mouth daily.   . Omega 3 1000 MG CAPS Take 1,000 mg by mouth daily.   . Probiotic Product (PROBIOTIC DAILY PO) Take 1 capsule by mouth 2 (two) times daily.   Marland Kitchen triamcinolone (NASACORT ALLERGY 24HR) 55 MCG/ACT AERO nasal inhaler Place 2 sprays into the nose daily as needed (allergies).     PHQ 2/9 Scores 11/17/2018 07/16/2017 08/31/2015 08/30/2015  PHQ - 2 Score 0 0 0 0  PHQ- 9 Score 0 - - -    BP Readings from Last 3 Encounters:  11/17/18  112/78  04/01/18 (!) 145/81  03/16/18 137/75    Physical Exam Vitals signs and nursing note reviewed.  Constitutional:      General: She is not in acute distress.    Appearance: She is well-developed.  HENT:     Head: Normocephalic and atraumatic.     Right Ear: Tympanic membrane and ear canal normal.     Left Ear: Tympanic membrane and ear canal normal.     Nose:     Right Sinus: No maxillary sinus tenderness.     Left Sinus: No maxillary sinus tenderness.  Eyes:     General: No scleral icterus.       Right eye: No discharge.        Left eye: No discharge.     Conjunctiva/sclera: Conjunctivae normal.  Neck:     Musculoskeletal: Normal range of motion. No erythema.     Thyroid: No thyromegaly.     Vascular: No carotid bruit.  Cardiovascular:     Rate and Rhythm: Normal rate and regular rhythm.     Pulses: Normal pulses.  Heart sounds: Normal heart sounds.  Pulmonary:     Effort: Pulmonary effort is normal. No respiratory distress.     Breath sounds: No wheezing.  Chest:     Breasts:        Right: Absent.        Left: Mass (post surgical changes/scar tissue) and skin change (surgical scar) present. No nipple discharge or tenderness.     Comments: Right breast absent - implant soft, no skin changes Abdominal:     General: Bowel sounds are normal.     Palpations: Abdomen is soft.     Tenderness: There is no abdominal tenderness.  Musculoskeletal: Normal range of motion.     Right hip: She exhibits tenderness (over lateral hip). She exhibits normal range of motion and normal strength.  Lymphadenopathy:     Cervical: No cervical adenopathy.  Skin:    General: Skin is warm and dry.     Capillary Refill: Capillary refill takes less than 2 seconds.     Findings: No rash.  Neurological:     General: No focal deficit present.     Mental Status: She is alert and oriented to person, place, and time.     Cranial Nerves: No cranial nerve deficit.     Sensory: No sensory  deficit.     Deep Tendon Reflexes: Reflexes are normal and symmetric.  Psychiatric:        Speech: Speech normal.        Behavior: Behavior normal.        Thought Content: Thought content normal.     Wt Readings from Last 3 Encounters:  11/17/18 159 lb (72.1 kg)  04/01/18 161 lb 9.6 oz (73.3 kg)  03/16/18 166 lb (75.3 kg)    BP 112/78   Pulse 84   Resp 16   Ht 5\' 6"  (1.676 m)   Wt 159 lb (72.1 kg)   SpO2 99%   BMI 25.66 kg/m   Assessment and Plan: 1. Annual physical exam Normal exam Continue healthy diet, resume exercise Due for DEXA - being ordered by Oncology - TSH - POCT urinalysis dipstick  2. Encounter for screening mammogram for breast cancer - MM 3D SCREEN BREAST UNI LEFT; Future  3. Gastroesophageal reflux disease, esophagitis presence not specified Symptoms well controlled on daily PPI No red flag signs such as weight loss, n/v, melena Will continue Nexium daily.  4. Hyperlipidemia, mild Check labs and advise - Lipid panel  5. Malignant neoplasm of right female breast, unspecified estrogen receptor status, unspecified site of breast ([place]) Stable post surgical changes with no evidence of recurrent disease S/p 5 yrs of AI therapy Recent labs, CA normal  6. Need for vaccination for pneumococcus Given today - Pneumococcal conjugate vaccine 13-valent IM  7. Need for shingles vaccine Faxed to [facility] pharmacy at pt request - Zoster Vaccine Adjuvanted [facility]) injection; Inject 0.5 mLs into the muscle once for 1 dose.  Dispense: 0.5 mL; Refill: 1  8. Bursitis of other bursa of right hip - predniSONE (DELTASONE) 10 MG tablet; Take 1 tablet (10 mg total) by mouth as directed for 6 days. Take 6,5,4,3,2,1 then stop  Dispense: 21 tablet; Refill: 0 - traMADol (ULTRAM) 50 MG tablet; Take 1 tablet (50 mg total) by mouth every 6 (six) hours as needed for up to 5 days.  Dispense: 20 tablet; Refill: 0   Partially dictated using Editor, commissioning. Any errors are  unintentional.  Halina Maidens, MD [facility]  11/17/2018

## 2018-11-18 DIAGNOSIS — E785 Hyperlipidemia, unspecified: Secondary | ICD-10-CM | POA: Diagnosis not present

## 2018-11-18 DIAGNOSIS — Z0000 Encounter for general adult medical examination without abnormal findings: Secondary | ICD-10-CM | POA: Diagnosis not present

## 2018-11-19 LAB — LIPID PANEL
Chol/HDL Ratio: 3.5 ratio (ref 0.0–4.4)
Cholesterol, Total: 266 mg/dL — ABNORMAL HIGH (ref 100–199)
HDL: 75 mg/dL (ref >39)
LDL Chol Calc (NIH): 175 mg/dL — ABNORMAL HIGH (ref 0–99)
Triglycerides: 93 mg/dL (ref 0–149)
VLDL Cholesterol Cal: 16 mg/dL (ref 5–40)

## 2018-11-19 LAB — TSH: TSH: 2.98 u[IU]/mL (ref 0.450–4.500)

## 2018-12-23 ENCOUNTER — Telehealth: Payer: Self-pay | Admitting: Plastic Surgery

## 2018-12-23 MED ORDER — DOXYCYCLINE HYCLATE 100 MG PO TABS: 100.0000 mg | ORAL_TABLET | Freq: Two times a day (BID) | ORAL | 0 refills | Status: AC

## 2018-12-23 NOTE — Telephone Encounter (Signed)
Slight redness and swelling at the 12 o'clock position of the left breast.  Likely a stitch irritation and fat necrosis.  Seen at [facility].  Will send in ABX. Follow up in 2 weeks.  Call if no change.

## 2018-12-29 ENCOUNTER — Ambulatory Visit
Admission: RE | Admit: 2018-12-29 | Discharge: 2018-12-29 | Disposition: A | Discharge status: Home / Self Care | Payer: 59 | Source: Ambulatory Visit | Attending: Internal Medicine | Admitting: Internal Medicine

## 2018-12-29 DIAGNOSIS — Z1231 Encounter for screening mammogram for malignant neoplasm of breast: Secondary | ICD-10-CM | POA: Insufficient documentation

## 2019-01-18 ENCOUNTER — Ambulatory Visit: Payer: 59

## 2019-03-02 DIAGNOSIS — H5213 Myopia, bilateral: Secondary | ICD-10-CM | POA: Diagnosis not present

## 2019-03-22 ENCOUNTER — Ambulatory Visit: Payer: 59 | Admitting: Plastic Surgery

## 2019-04-19 ENCOUNTER — Ambulatory Visit: Payer: 59 | Admitting: Plastic Surgery

## 2019-04-26 ENCOUNTER — Ambulatory Visit: Payer: 59 | Admitting: Plastic Surgery

## 2019-05-17 ENCOUNTER — Other Ambulatory Visit (INDEPENDENT_AMBULATORY_CARE_PROVIDER_SITE_OTHER): Payer: Self-pay | Admitting: Vascular Surgery

## 2019-05-17 DIAGNOSIS — R221 Localized swelling, mass and lump, neck: Secondary | ICD-10-CM

## 2019-05-18 ENCOUNTER — Ambulatory Visit (INDEPENDENT_AMBULATORY_CARE_PROVIDER_SITE_OTHER): Payer: 59

## 2019-05-18 ENCOUNTER — Other Ambulatory Visit: Payer: Self-pay

## 2019-05-18 DIAGNOSIS — R221 Localized swelling, mass and lump, neck: Secondary | ICD-10-CM

## 2019-05-24 ENCOUNTER — Ambulatory Visit (INDEPENDENT_AMBULATORY_CARE_PROVIDER_SITE_OTHER): Payer: 59 | Admitting: Plastic Surgery

## 2019-05-24 ENCOUNTER — Encounter: Payer: Self-pay | Admitting: Plastic Surgery

## 2019-05-24 ENCOUNTER — Other Ambulatory Visit: Payer: Self-pay

## 2019-05-24 VITALS — BP 160/90 | HR 98 | Temp 97.8°F | Ht 66.0 in | Wt 173.2 lb

## 2019-05-24 DIAGNOSIS — N651 Disproportion of reconstructed breast: Secondary | ICD-10-CM | POA: Diagnosis not present

## 2019-05-24 NOTE — Progress Notes (Signed)
   Subjective:    Patient ID: [name], female    DOB: [birth year], 69 y.o.   MRN: [id number]  The patient is a 69 year old female here for follow-up on her breast surgery.  She had a right mastectomy in 2014 with reconstruction.  She had an expander and then implant placed at another facility.  She then came to see me over a year ago for breast asymmetry.  At the time she had an Allergan Inspira 445 cc implant.  In December 2019 we did revision with removal of the implant and placement of a Mentor smooth round high profile 400 cc gel implant and a mastopexy on the other side.  Postoperatively she had a little flareup of the left areola and was placed on doxycycline.  This resolved the issue and she has not had any since.  She has gained a little bit of weight through Covid and this has a doctor the lateral aspect of her right breast.  She is doing extremely well and happy with her result is getting ready to retire from [employer] and will likely remain as needed for 1 year.      Review of Systems  Constitutional: Negative.   HENT: Negative.   Eyes: Negative.   Respiratory: Negative.  Negative for chest tightness.   Cardiovascular: Negative.   Gastrointestinal: Negative.   Endocrine: Negative.   Genitourinary: Negative.   Musculoskeletal: Negative.   Hematological: Negative.        Objective:   Physical Exam Vitals and nursing note reviewed.  Constitutional:      Appearance: Normal appearance.  HENT:     Head: Normocephalic and atraumatic.  Cardiovascular:     Rate and Rhythm: Normal rate.     Pulses: Normal pulses.  Pulmonary:     Effort: Pulmonary effort is normal.  Abdominal:     General: Abdomen is flat. There is no distension.  Skin:    General: Skin is warm.     Capillary Refill: Capillary refill takes less than 2 seconds.  Neurological:     Mental Status: She is alert. Mental status is at baseline.  Psychiatric:        Mood and Affect: Mood normal.      Behavior: Behavior normal.        Thought Content: Thought content normal.       Assessment & Plan:     ICD-10-CM   1. Breast asymmetry following reconstructive surgery  N65.1     I would like to see her back in 1 year and check on the reconstruction.  She is to let us know if she has any questions or concerns in the meantime.  She is due for a mammogram in October and she is aware.

## 2019-06-28 ENCOUNTER — Other Ambulatory Visit: Payer: Self-pay

## 2019-06-28 ENCOUNTER — Ambulatory Visit: Payer: 59 | Admitting: Dermatology

## 2019-06-28 DIAGNOSIS — L570 Actinic keratosis: Secondary | ICD-10-CM | POA: Diagnosis not present

## 2019-06-28 DIAGNOSIS — Z1283 Encounter for screening for malignant neoplasm of skin: Secondary | ICD-10-CM

## 2019-06-28 DIAGNOSIS — H61002 Unspecified perichondritis of left external ear: Secondary | ICD-10-CM | POA: Diagnosis not present

## 2019-06-28 DIAGNOSIS — D1800 Hemangioma unspecified site: Secondary | ICD-10-CM | POA: Diagnosis not present

## 2019-06-28 DIAGNOSIS — L281 Prurigo nodularis: Secondary | ICD-10-CM | POA: Diagnosis not present

## 2019-06-28 DIAGNOSIS — L578 Other skin changes due to chronic exposure to nonionizing radiation: Secondary | ICD-10-CM

## 2019-06-28 DIAGNOSIS — L820 Inflamed seborrheic keratosis: Secondary | ICD-10-CM

## 2019-06-28 DIAGNOSIS — L814 Other melanin hyperpigmentation: Secondary | ICD-10-CM | POA: Diagnosis not present

## 2019-06-28 DIAGNOSIS — L821 Other seborrheic keratosis: Secondary | ICD-10-CM

## 2019-06-28 NOTE — Progress Notes (Signed)
   New Patient Visit  Subjective  [name] is a 69 y.o. female who presents for the following: Annual Exam. Patient has a spot on her ear that's been present for 2 months. She sleeps on her sides and also has a history of sunburns on her ears in the past. Patient also has a bump on her posterior scalp that's been present for 2 years.  She frequently picks at it.   Objective  Well appearing patient in no apparent distress; mood and affect are within normal limits.  A full examination was performed including scalp, head, eyes, ears, nose, lips, neck, chest, axillae, abdomen, back, buttocks, bilateral upper extremities, bilateral lower extremities, hands, feet, fingers, toes, fingernails, and toenails. All findings within normal limits unless otherwise noted below.  Objective  Sup Left Ear Helix at Rim (3): Firm tiny white papules.  Objective  Occipital Scalp at Hairline: Pink flesh firm papule, 6.74mm  Assessment & Plan   Skin cancer screening performed today.  Actinic Damage - diffuse scaly erythematous macules with underlying dyspigmentation - Recommend daily broad spectrum sunscreen SPF 30+ to sun-exposed areas, reapply every 2 hours as needed.  - Call for new or changing lesions.  Lentigines - Scattered tan macules - Discussed due to sun exposure - Benign, observe - Call for any changes  Seborrheic Keratoses - Stuck-on, waxy, tan-brown papules and plaques  - Discussed benign etiology and prognosis. - Observe - Call for any changes  Hemangiomas - Red papules - Discussed benign nature - Observe - Call for any changes       AK (actinic keratosis) (3) Sup Left Ear Helix at Rim  Vs [facility]  Patient should try to avoid sleeping on left side or she can use a donut or travel pillow while sleeping.  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 TBSE in 1 yr.Lindi Adie, CMA, am acting as scribe for Brendolyn Patty, MD .

## 2019-06-28 NOTE — Patient Instructions (Signed)
Seborrheic Keratosis  What causes seborrheic keratoses? Seborrheic keratoses are harmless, common skin growths that first appear during adult life.  As time goes by, more growths appear.  Some people may develop a large number of them.  Seborrheic keratoses appear on both covered and uncovered body parts.  They are not caused by sunlight.  The tendency to develop seborrheic keratoses can be inherited.  They vary in color from skin-colored to gray, brown, or even black.  They can be either smooth or have a rough, warty surface.   Seborrheic keratoses are superficial and look as if they were stuck on the skin.  Under the microscope this type of keratosis looks like layers upon layers of skin.  That is why at times the top layer may seem to fall off, but the rest of the growth remains and re-grows.    Treatment Seborrheic keratoses do not need to be treated, but can easily be removed in the office.  Seborrheic keratoses often cause symptoms when they rub on clothing or jewelry.  Lesions can be in the way of shaving.  If they become inflamed, they can cause itching, soreness, or burning.  Removal of a seborrheic keratosis can be accomplished by freezing, burning, or surgery. If any spot bleeds, scabs, or grows rapidly, please return to have it checked, as these can be an indication of a skin cancer.   Cryotherapy Aftercare  Wash gently with soap and water everyday.   Apply Vaseline and Band-Aid daily until healed.  

## 2019-08-11 ENCOUNTER — Other Ambulatory Visit: Payer: Self-pay

## 2019-08-11 ENCOUNTER — Encounter: Payer: Self-pay | Admitting: Cardiovascular Disease

## 2019-08-11 ENCOUNTER — Ambulatory Visit (INDEPENDENT_AMBULATORY_CARE_PROVIDER_SITE_OTHER): Payer: 59 | Admitting: Cardiovascular Disease

## 2019-08-11 VITALS — BP 136/90 | HR 85 | Ht 66.0 in | Wt 170.1 lb

## 2019-08-11 DIAGNOSIS — R0609 Other forms of dyspnea: Secondary | ICD-10-CM

## 2019-08-11 DIAGNOSIS — R0600 Dyspnea, unspecified: Secondary | ICD-10-CM

## 2019-08-11 DIAGNOSIS — R072 Precordial pain: Secondary | ICD-10-CM

## 2019-08-11 DIAGNOSIS — E7800 Pure hypercholesterolemia, unspecified: Secondary | ICD-10-CM | POA: Diagnosis not present

## 2019-08-11 MED ORDER — METOPROLOL TARTRATE 100 MG PO TABS
ORAL_TABLET | ORAL | 0 refills | Status: DC
Start: 2019-08-11 — End: 2019-11-01

## 2019-08-11 NOTE — Progress Notes (Signed)
Cardiology Office Note   Date:  08/11/2019   ID:  [name], DOB 1950-07-27, MRN [id number]  PCP:  Glean Hess, MD  Cardiologist:   Kathlyn Sacramento, MD   Chief Complaint  Patient presents with  . office visit    Pt having some mild chest pain/ SOB w/ exterion/ palps. Meds verbally reviewed w/ pt.       History of Present Illness: [name] is a 69 y.o. female who presents for evaluation of shortness of breath and chest pain. 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  She did not receive chemotherapy or radiation.  She recently noticed a pulsatile neck mass on the right side.  She underwent carotid Doppler which showed no significant abnormalities.  Over the last 6 to 12 months, she experienced progressive exertional dyspnea which is currently happening with moderate intensity activities such as going up 1 flight of stairs.  Occasionally this is associated with substernal chest pain and tightness.  She has no history of diabetes or hypertension.  However, she has strong family history of premature coronary artery disease.  Her father had myocardial infarction in his 70s and ultimately had CABG and died at the age of 65.  Her brother who is only 75 years older had PCI's followed by CABG.  No orthopnea or PND.  She did gain about 10-15 pounds over the last year.  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 Most recent cardiac evaluation was in 2014.  A treadmill nuclear stress test was negative for ischemia and echo was unremarkable.   Past Medical History:  Diagnosis Date  . Arthritis   . Breast cancer ([place]) 02/2014  . Cancer ([place])    Vulvular  . GERD (gastroesophageal reflux disease)   . Hepatitis    Hep B in 1981  . PONV (postoperative nausea and vomiting)    pt states scopalamine patch helped and zofran and  phenergan helped    Past Surgical History:  Procedure Laterality Date  . ABDOMINAL HYSTERECTOMY  1997  . AUGMENTATION MAMMAPLASTY Right 123XX123   Silicone  . BREAST BIOPSY Right    Positive  . BREAST EXCISIONAL BIOPSY Left 02/17/2018   Removed fat necrosis   . BREAST REDUCTION SURGERY Left 01/18/2015   Procedure: MAMMARY REDUCTION  (BREAST);  Surgeon: Nicholaus Bloom, MD;  Location: [place];  Service: Plastics;  Laterality: Left;  . BREAST REDUCTION SURGERY Left 02/17/2018   Procedure: Left breast reduction with excision of fat necrosis-no liposuction needed;  Surgeon: Wallace Going, DO;  Location: [facility];  Service: Plastics;  Laterality: Left;  . PLACEMENT OF BREAST IMPLANTS Right 02/17/2018   Procedure: PLACEMENT OF BREAST IMPLANTS;  Surgeon: Wallace Going, DO;  Location: [facility];  Service: Plastics;  Laterality: Right;  . REDUCTION MAMMAPLASTY Left 02/2015   breast reduction  . TISSUE EXPANDER PLACEMENT Right 01/18/2015   Procedure: Right  BREAST EXPANDER PLACEMENT WITH ALLODERM;  Surgeon: Nicholaus Bloom, MD;  Location: [place];  Service: Plastics;  Laterality: Right;  DR COAN RM 2 PER BRENDA  . TISSUE EXPANDER PLACEMENT Right 03/13/2015   Procedure: Right breast tissue expander removal and Sillicone implant placement.;  Surgeon: Nicholaus Bloom, MD;  Location: [place];  Service: Plastics;  Laterality: Right;  . TOTAL MASTECTOMY Right 02/2013   stage 1  ER/PR+  . VULVECTOMY       Current Outpatient Medications  Medication Sig Dispense Refill  . Calcium Carbonate-Vitamin D (CALCIUM + D PO) Take 1 tablet by mouth 2 (two) times daily.     Marland Kitchen esomeprazole (NEXIUM) 20 MG capsule Take 20 mg by mouth daily at 12 noon.    . fexofenadine (ALLEGRA) 180 MG tablet Take 180 mg by mouth daily as needed for allergies.     . Magnesium 500 MG TABS Take 500 mg by mouth daily.     . Omega 3 1000 MG CAPS Take 1,000 mg by mouth daily.     . Probiotic Product  (PROBIOTIC DAILY PO) Take 1 capsule by mouth 2 (two) times daily.     Marland Kitchen triamcinolone (NASACORT ALLERGY 24HR) 55 MCG/ACT AERO nasal inhaler Place 2 sprays into the nose daily as needed (allergies).     . Turmeric (QC TUMERIC COMPLEX) 500 MG CAPS Take by mouth. Taking 2 caps daily     No current facility-administered medications for this visit.    Allergies:   Penicillins and Sulfa antibiotics    Social History:  The patient  reports that she has never smoked. She has never used smokeless tobacco. She reports current alcohol use of about 5.0 standard drinks of alcohol per week. She reports that she does not use drugs.   Family History:  The patient's family history includes Breast cancer in her cousin and paternal grandmother; Diabetes in her brother and father; Hypertension in her father and mother; Leukemia in her maternal grandmother; Stroke in her father; Thyroid disease in her mother.    ROS:  Please see the history of present illness.   Otherwise, review of systems are positive for none.   All other systems are reviewed and negative.    PHYSICAL EXAM: VS:  BP 136/90 (BP Location: Left Arm, Patient Position: Sitting, Cuff Size: Normal)   Pulse 85   Ht 5\' 6"  (1.676 m)   Wt 170 lb 2 oz (77.2 kg)   SpO2 98%   BMI 27.46 kg/m  , BMI Body mass index is 27.46 kg/m. GEN: Well nourished, well developed, in no acute distress  HEENT: normal  Neck: no JVD, carotid bruits, or masses Cardiac: RRR; no murmurs, rubs, or gallops,no edema  Respiratory:  clear to auscultation bilaterally, normal work of breathing GI: soft, nontender, nondistended, + BS MS: no deformity or atrophy  Skin: warm and dry, no rash Neuro:  Strength and sensation are intact Psych: euthymic mood, full affect   EKG:  EKG is ordered today. The ekg ordered today demonstrates normal sinus rhythm with no significant ST or T wave changes.   Recent Labs: 11/18/2018: TSH 2.980    Lipid Panel    Component Value  Date/Time   CHOL 266 (H) 11/18/2018 0848   TRIG 93 11/18/2018 0848   HDL 75 11/18/2018 0848   CHOLHDL 3.5 11/18/2018 0848   LDLCALC 175 (H) 11/18/2018 0848      Wt Readings from Last 3 Encounters:  08/11/19 170 lb 2 oz (77.2 kg)  05/24/19 173 lb 3.2 oz (78.6 kg)  11/17/18 159 lb (72.1 kg)       No flowsheet data found.    ASSESSMENT AND PLAN:  1.  Severe exertional dyspnea with associated chest pain: She is at high risk for underlying ischemic heart disease considering age and strong family history.  Fortunately, her EKG does not show any ischemic changes.  I recommend evaluation with CTA of  the coronary arteries with FFR.  She is not allergic to contrast.  I also requested an echocardiogram to evaluate diastolic function and pulmonary pressure.   2.  Hyperlipidemia: Most recent lipid profile last year showed a cholesterol of 266, triglyceride 93, HDL of 75 and LDL of 175.  The CTA will be helpful in risk stratifying her to see if she has any evidence of atherosclerosis or calcifications that require medical management of hyperlipidemia.   Disposition:   FU with me as needed  Signed,  Kathlyn Sacramento, MD  08/11/2019 2:26 PM    [place]

## 2019-08-11 NOTE — Patient Instructions (Signed)
Medication Instructions:  Your physician recommends that you continue on your current medications as directed. Please refer to the Current Medication list given to you today.  *If you need a refill on your cardiac medications before your next appointment, please call your pharmacy*   Lab Work: Your physician recommends that you return for lab work 1 week prior to CT at the medical mall. No appt is needed. Hours are M-F 7AM- 6 PM.  If you have labs (blood work) drawn today and your tests are completely normal, you will receive your results only by: Marland Kitchen MyChart Message (if you have MyChart) OR . A paper copy in the mail If you have any lab test that is abnormal or we need to change your treatment, we will call you to review the results.   Testing/Procedures: 1- Echo  Please return to [facility] on ______________ at _______________ AM/PM for an Echocardiogram. Your physician has requested that you have an echocardiogram. Echocardiography is a painless test that uses sound waves to create images of your heart. It provides your doctor with information about the size and shape of your heart and how well your heart's chambers and valves are working. This procedure takes approximately one hour. There are no restrictions for this procedure. Please note; depending on visual quality an IV may need to be placed.   2- Your cardiac CT will be scheduled at one of the below locations:   [facility] [address] [place], [place] [address] (336) [place] [address] [place], [place] [address] [phone]  If scheduled at [facility], please arrive at the [facility] main entrance of [facility] 30 minutes prior to test start time. Proceed to the [facility] Radiology Department (first floor) to check-in and test prep.  If scheduled at [facility], please arrive 15  mins early for check-in and test prep.  Please follow these instructions carefully (unless otherwise directed):  Hold all erectile dysfunction medications at least 3 days (72 hrs) prior to test.  On the Night Before the Test: . Be sure to Drink plenty of water. . Do not consume any caffeinated/decaffeinated beverages or chocolate 12 hours prior to your test. . Do not take any antihistamines 12 hours prior to your test. . If you take Metformin do not take 24 hours prior to test.  On the Day of the Test: . Drink plenty of water. Do not drink any water within one hour of the test. . Do not eat any food 4 hours prior to the test. . You may take your regular medications prior to the test.  . Take metoprolol (Lopressor) two hours prior to test. . FEMALES- please wear underwire-free bra if available      After the Test: . Drink plenty of water. . After receiving IV contrast, you may experience a mild flushed feeling. This is normal. . On occasion, you may experience a mild rash up to 24 hours after the test. This is not dangerous. If this occurs, you can take Benadryl 25 mg and increase your fluid intake. . If you experience trouble breathing, this can be serious. If it is severe call 911 IMMEDIATELY. If it is mild, please call our office. . If you take any of these medications: Glipizide/Metformin, Avandament, Glucavance, please do not take 48 hours after completing test unless otherwise instructed.   Once we have confirmed authorization from your insurance  company, we will call you to set up a date and time for your test.   For non-scheduling related questions, please contact the cardiac imaging nurse navigator should you have any questions/concerns: Marchia Bond, Cardiac Imaging Nurse Navigator Burley Saver, Interim Cardiac Imaging Nurse [place] [facility] Direct Office Dial: [phone]   For scheduling needs, including cancellations and rescheduling,  please call [phone].      Follow-Up: At [facility], you and your health needs are our priority.  As part of our continuing mission to provide you with exceptional heart care, we have created designated Provider Care Teams.  These Care Teams include your primary Cardiologist (physician) and Advanced Practice Providers (APPs -  Physician Assistants and Nurse Practitioners) who all work together to provide you with the care you need, when you need it.  We recommend signing up for the patient portal called "MyChart".  Sign up information is provided on this After Visit Summary.  MyChart is used to connect with patients for Virtual Visits (Telemedicine).  Patients are able to view lab/test results, encounter notes, upcoming appointments, etc.  Non-urgent messages can be sent to your provider as well.   To learn more about what you can do with MyChart, go to [email or web address].    Your next appointment:   As needed based on results   The format for your next appointment:   In Person  Provider:    You may see Dr. Fletcher Anon or one of the following Advanced Practice Providers on your designated Care Team:    Murray Hodgkins, NP  Christell Faith, PA-C  Marrianne Mood, PA-C

## 2019-08-30 ENCOUNTER — Other Ambulatory Visit
Admission: RE | Admit: 2019-08-30 | Discharge: 2019-08-30 | Disposition: A | Discharge status: Home / Self Care | Payer: 59 | Attending: Cardiovascular Disease | Admitting: Cardiovascular Disease

## 2019-08-30 DIAGNOSIS — R072 Precordial pain: Secondary | ICD-10-CM

## 2019-08-30 LAB — BASIC METABOLIC PANEL
Anion gap: 9 (ref 5–15)
BUN: 18 mg/dL (ref 8–23)
CO2: 25 mmol/L (ref 22–32)
Calcium: 9.1 mg/dL (ref 8.9–10.3)
Chloride: 106 mmol/L (ref 98–111)
Creatinine, Ser: 0.73 mg/dL (ref 0.44–1.00)
GFR calc Af Amer: >60 mL/min (ref >60)
GFR calc non Af Amer: >60 mL/min (ref >60)
Glucose, Bld: 100 mg/dL — ABNORMAL HIGH (ref 70–99)
Potassium: 4.5 mmol/L (ref 3.5–5.1)
Sodium: 140 mmol/L (ref 135–145)

## 2019-09-07 ENCOUNTER — Telehealth (HOSPITAL_COMMUNITY): Payer: Self-pay | Admitting: *Deleted

## 2019-09-07 NOTE — Telephone Encounter (Signed)
Reaching out to patient to offer assistance regarding upcoming cardiac imaging study; pt verbalizes understanding of appt date/time, parking situation and where to check in, pre-test NPO status and medications ordered, and verified current allergies; name and call back number provided for further questions should they arise ° °Molly Savarino Tai RN Navigator Cardiac Imaging °[facility] °[phone] office °[phone] cell ° °

## 2019-09-08 ENCOUNTER — Ambulatory Visit
Admission: RE | Admit: 2019-09-08 | Discharge: 2019-09-08 | Disposition: A | Discharge status: Home / Self Care | Payer: 59 | Source: Ambulatory Visit | Attending: Cardiovascular Disease | Admitting: Cardiovascular Disease

## 2019-09-08 ENCOUNTER — Other Ambulatory Visit: Payer: Self-pay

## 2019-09-08 DIAGNOSIS — R072 Precordial pain: Secondary | ICD-10-CM | POA: Diagnosis not present

## 2019-09-08 MED ORDER — NITROGLYCERIN 0.4 MG SL SUBL
0.8000 mg | SUBLINGUAL_TABLET | Freq: Once | SUBLINGUAL | Status: AC
  Administered 2019-09-08: 0.8 mg via SUBLINGUAL

## 2019-09-08 MED ORDER — METOPROLOL TARTRATE 5 MG/5ML IV SOLN
5.0000 mg | Freq: Once | INTRAVENOUS | Status: AC
  Administered 2019-09-08: 5 mg via INTRAVENOUS

## 2019-09-08 MED ORDER — IOHEXOL 350 MG/ML SOLN
100.0000 mL | Freq: Once | INTRAVENOUS | Status: AC | PRN
  Administered 2019-09-08: 100 mL via INTRAVENOUS

## 2019-09-08 NOTE — Progress Notes (Signed)
Patient tolerated procedure well. Ambulate without difficulty. Sitting in chair talking with staff, drinking coffee. Declined crackers. Slight headache 2/10.  No further needs. All questions answered.

## 2019-09-09 ENCOUNTER — Telehealth: Payer: Self-pay

## 2019-09-09 DIAGNOSIS — E785 Hyperlipidemia, unspecified: Secondary | ICD-10-CM

## 2019-09-09 DIAGNOSIS — R072 Precordial pain: Secondary | ICD-10-CM | POA: Diagnosis not present

## 2019-09-09 MED ORDER — ASPIRIN EC 81 MG PO TBEC
81.0000 mg | DELAYED_RELEASE_TABLET | Freq: Every day | ORAL | Status: AC
Start: 2019-09-09 — End: ?

## 2019-09-09 MED ORDER — ROSUVASTATIN CALCIUM 20 MG PO TABS: 20.0000 mg | ORAL_TABLET | Freq: Every day | ORAL | 3 refills | Status: DC

## 2019-09-09 NOTE — Telephone Encounter (Signed)
Patient made aware of Cardiac CTA results and Dr. Tyrell Antonio recommendation. Patient is agreeable with the plan. Start Asa 81 mg daily and Crestor 20 mg daily. Rx sent to the patients pharmacy. Patient will have her 6-8 week fasting lipid and lft at the [facility]. Orders are in [place]. F/u appt with Dr. Fletcher Anon scheduled for 11/01/19 @ 3:40pm.

## 2019-09-09 NOTE — Telephone Encounter (Signed)
-----   Message from Sabrina Hampshire, MD sent at 09/09/2019 12:37 PM EDT ----- Inform patient that cardiac CTA showed no evidence of critical stenosis.  However, her calcium score was significantly high and she has evidence of mild to moderate coronary atherosclerosis. Recommend adding aspirin 81 mg daily and we also need to treat her hyperlipidemia.  Add rosuvastatin 20 mg daily and check lipid and liver profile in 6 to 8 weeks. Schedule follow-up with me after her echo.  Cc: Dr. Army Melia

## 2019-09-22 ENCOUNTER — Ambulatory Visit (INDEPENDENT_AMBULATORY_CARE_PROVIDER_SITE_OTHER): Payer: 59

## 2019-09-22 ENCOUNTER — Other Ambulatory Visit: Payer: Self-pay

## 2019-09-22 DIAGNOSIS — R072 Precordial pain: Secondary | ICD-10-CM | POA: Diagnosis not present

## 2019-10-18 ENCOUNTER — Other Ambulatory Visit
Admission: RE | Admit: 2019-10-18 | Discharge: 2019-10-18 | Disposition: A | Discharge status: Home / Self Care | Payer: 59 | Attending: Cardiovascular Disease | Admitting: Cardiovascular Disease

## 2019-10-18 DIAGNOSIS — E785 Hyperlipidemia, unspecified: Secondary | ICD-10-CM | POA: Insufficient documentation

## 2019-10-18 LAB — LIPID PANEL
Cholesterol: 201 mg/dL — ABNORMAL HIGH (ref 0–200)
HDL: 69 mg/dL (ref >40)
LDL Cholesterol: 105 mg/dL — ABNORMAL HIGH (ref 0–99)
Total CHOL/HDL Ratio: 2.9 RATIO
Triglycerides: 134 mg/dL (ref <150)
VLDL: 27 mg/dL (ref 0–40)

## 2019-10-18 LAB — HEPATIC FUNCTION PANEL
ALT: 23 U/L (ref 0–44)
AST: 29 U/L (ref 15–41)
Albumin: 4.3 g/dL (ref 3.5–5.0)
Alkaline Phosphatase: 62 U/L (ref 38–126)
Bilirubin, Direct: <0.1 mg/dL (ref 0.0–0.2)
Total Bilirubin: 0.7 mg/dL (ref 0.3–1.2)
Total Protein: 7.6 g/dL (ref 6.5–8.1)

## 2019-10-20 ENCOUNTER — Telehealth: Payer: Self-pay

## 2019-10-20 DIAGNOSIS — E785 Hyperlipidemia, unspecified: Secondary | ICD-10-CM

## 2019-10-20 MED ORDER — EZETIMIBE 10 MG PO TABS: 10.0000 mg | ORAL_TABLET | Freq: Every day | ORAL | 3 refills | Status: DC

## 2019-10-20 NOTE — Telephone Encounter (Signed)
-----   Message from Wellington Hampshire, MD sent at 10/20/2019  8:46 AM EDT ----- Inform patient that labs were normal. Cholesterol significantly improved from before but is still not at target of less than 70.  Continue rosuvastatin and add Zetia 10 mg daily.  She should work on heart healthy diet.  Repeat lipid and liver profile in 3 months.  CC: Dr. Army Melia

## 2019-10-20 NOTE — Telephone Encounter (Signed)
Patient made aware of lab results and Dr. Tyrell Antonio recommendation. Patient is agreeable with the plan. Rx for Zetia 10 mg daily has been sent to the patients pharmacy.  Lab orders for fasting labs placed in Epic to be drawn at the medical mall in 3 months.

## 2019-10-25 ENCOUNTER — Ambulatory Visit: Payer: 59 | Admitting: Dermatology

## 2019-11-01 ENCOUNTER — Ambulatory Visit (INDEPENDENT_AMBULATORY_CARE_PROVIDER_SITE_OTHER): Payer: 59 | Admitting: Cardiovascular Disease

## 2019-11-01 ENCOUNTER — Encounter: Payer: Self-pay | Admitting: Internal Medicine

## 2019-11-01 ENCOUNTER — Other Ambulatory Visit: Payer: Self-pay

## 2019-11-01 ENCOUNTER — Encounter: Payer: Self-pay | Admitting: Cardiovascular Disease

## 2019-11-01 VITALS — BP 124/78 | HR 75 | Ht 66.0 in | Wt 174.0 lb

## 2019-11-01 DIAGNOSIS — I2510 Atherosclerotic heart disease of native coronary artery without angina pectoris: Secondary | ICD-10-CM

## 2019-11-01 DIAGNOSIS — R0600 Dyspnea, unspecified: Secondary | ICD-10-CM | POA: Diagnosis not present

## 2019-11-01 DIAGNOSIS — R0609 Other forms of dyspnea: Secondary | ICD-10-CM

## 2019-11-01 DIAGNOSIS — E785 Hyperlipidemia, unspecified: Secondary | ICD-10-CM | POA: Diagnosis not present

## 2019-11-01 NOTE — Patient Instructions (Addendum)
Medication Instructions:  Your physician recommends that you continue on your current medications as directed. Please refer to the Current Medication list given to you today.  *If you need a refill on your cardiac medications before your next appointment, please call your pharmacy*   Lab Work: Your physician recommends that you return for a FASTING lipid profile and hepatic panel in early November 2021.  Please have your labs drawn at the medical mall.  If you have labs (blood work) drawn today and your tests are completely normal, you will receive your results only by: Marland Kitchen MyChart Message (if you have MyChart) OR . A paper copy in the mail If you have any lab test that is abnormal or we need to change your treatment, we will call you to review the results.   Testing/Procedures: None ordered   Follow-Up: At [facility], you and your health needs are our priority.  As part of our continuing mission to provide you with exceptional heart care, we have created designated Provider Care Teams.  These Care Teams include your primary Cardiologist (physician) and Advanced Practice Providers (APPs -  Physician Assistants and Nurse Practitioners) who all work together to provide you with the care you need, when you need it.  We recommend signing up for the patient portal called "MyChart".  Sign up information is provided on this After Visit Summary.  MyChart is used to connect with patients for Virtual Visits (Telemedicine).  Patients are able to view lab/test results, encounter notes, upcoming appointments, etc.  Non-urgent messages can be sent to your provider as well.   To learn more about what you can do with MyChart, go to [email or web address].    Your next appointment:   6 months  The format for your next appointment:   In Person  Provider:    You may see  Dr. Fletcher Anon or one of the following Advanced Practice Providers on your designated Care Team:    Murray Hodgkins, NP  Christell Faith, PA-C  Marrianne Mood, PA-C    Other Instructions N/A

## 2019-11-01 NOTE — Progress Notes (Signed)
Cardiology Office Note   Date:  11/01/2019   ID:  [name], DOB 1951-01-23, MRN [id number]  PCP:  Glean Hess, MD  Cardiologist:   Kathlyn Sacramento, MD   Chief Complaint  Patient presents with  . Follow-up    Follow up after cardiac CTA and Echo. Medications verbally reviewed with patient.       History of Present Illness: [name] is a 69 y.o. female who is here today for follow-up visit regarding mild to moderate coronary artery disease.   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  She did not receive chemotherapy or radiation.   Carotid Doppler in March of this year showed no significant disease.  She was seen few months ago for exertional dyspnea with occasional chest pain.   She underwent cardiac CTA which showed a calcium score of 274 which was in the 90th percentile for age and sex matched control.  There was evidence of moderate stenosis in the mid LAD and mild disease in the proximal LAD as well as ostial and distal right coronary artery.  None of the lesions were significant by FFR.  Echocardiogram showed normal LV systolic function and mild mitral regurgitation.  No evidence of pulmonary hypertension.  Based on CTA results, she will started on 81 mg once daily and rosuvastatin 20 mg daily.  Follow-up labs showed improvement in LDL from 164 to 105.  I added Zetia 10 mg daily. She has been doing well with no recent chest pain.  Dyspnea improved without intervention.  She is tolerating medications.  Past Medical History:  Diagnosis Date  . Arthritis   . Breast cancer ([place]) 02/2014  . Cancer ([place])    Vulvular  . GERD (gastroesophageal reflux disease)   . Hepatitis    Hep B in 1981  . PONV (postoperative nausea and vomiting)    pt states scopalamine patch helped and zofran and phenergan helped    Past Surgical History:   Procedure Laterality Date  . ABDOMINAL HYSTERECTOMY  1997  . AUGMENTATION MAMMAPLASTY Right 94/7654   Silicone  . BREAST BIOPSY Right    Positive  . BREAST EXCISIONAL BIOPSY Left 02/17/2018   Removed fat necrosis   . BREAST REDUCTION SURGERY Left 01/18/2015   Procedure: MAMMARY REDUCTION  (BREAST);  Surgeon: Nicholaus Bloom, MD;  Location: [place];  Service: Plastics;  Laterality: Left;  . BREAST REDUCTION SURGERY Left 02/17/2018   Procedure: Left breast reduction with excision of fat necrosis-no liposuction needed;  Surgeon: Wallace Going, DO;  Location: [facility];  Service: Plastics;  Laterality: Left;  . PLACEMENT OF BREAST IMPLANTS Right 02/17/2018   Procedure: PLACEMENT OF BREAST IMPLANTS;  Surgeon: Wallace Going, DO;  Location: [facility];  Service: Plastics;  Laterality: Right;  . REDUCTION MAMMAPLASTY Left 02/2015   breast reduction  . TISSUE EXPANDER PLACEMENT Right 01/18/2015   Procedure: Right  BREAST EXPANDER PLACEMENT WITH ALLODERM;  Surgeon: Nicholaus Bloom, MD;  Location: [place];  Service: Plastics;  Laterality: Right;  DR COAN RM 2 PER BRENDA  . TISSUE EXPANDER PLACEMENT Right 03/13/2015   Procedure: Right breast tissue expander removal and Sillicone implant placement.;  Surgeon: Nicholaus Bloom, MD;  Location: [place];  Service: Plastics;  Laterality: Right;  . TOTAL MASTECTOMY Right 02/2013   stage 1 ER/PR+  . VULVECTOMY  Current Outpatient Medications  Medication Sig Dispense Refill  . aspirin EC 81 MG tablet Take 1 tablet (81 mg total) by mouth daily. Swallow whole.    . Calcium Carbonate-Vitamin D (CALCIUM + D PO) Take 1 tablet by mouth 2 (two) times daily.     Marland Kitchen esomeprazole (NEXIUM) 20 MG capsule Take 20 mg by mouth daily at 12 noon.    . ezetimibe (ZETIA) 10 MG tablet Take 1 tablet (10 mg total) by mouth daily. 90 tablet 3  . fexofenadine (ALLEGRA) 180 MG tablet Take 180 mg by mouth daily as needed for allergies.     .  Magnesium 500 MG TABS Take 500 mg by mouth daily.     . metoprolol tartrate (LOPRESSOR) 100 MG tablet Take 1 tablet (100 mg total) one time only 2 hours prior to imaging. 1 tablet 0  . Omega 3 1000 MG CAPS Take 1,000 mg by mouth daily.     . Probiotic Product (PROBIOTIC DAILY PO) Take 1 capsule by mouth 2 (two) times daily.     . rosuvastatin (CRESTOR) 20 MG tablet Take 1 tablet (20 mg total) by mouth daily. 90 tablet 3  . triamcinolone (NASACORT ALLERGY 24HR) 55 MCG/ACT AERO nasal inhaler Place 2 sprays into the nose daily as needed (allergies).     . Turmeric (QC TUMERIC COMPLEX) 500 MG CAPS Take by mouth. Taking 2 caps daily     No current facility-administered medications for this visit.    Allergies:   Penicillins and Sulfa antibiotics    Social History:  The patient  reports that she has never smoked. She has never used smokeless tobacco. She reports current alcohol use of about 5.0 standard drinks of alcohol per week. She reports that she does not use drugs.   Family History:  The patient's family history includes Breast cancer in her cousin and paternal grandmother; Diabetes in her brother and father; Hypertension in her father and mother; Leukemia in her maternal grandmother; Stroke in her father; Thyroid disease in her mother.    ROS:  Please see the history of present illness.   Otherwise, review of systems are positive for none.   All other systems are reviewed and negative.    PHYSICAL EXAM: VS:  BP 124/78 (BP Location: Left Arm, Patient Position: Sitting, Cuff Size: Normal)   Pulse 75   Ht 5\' 6"  (1.676 m)   Wt 174 lb (78.9 kg)   SpO2 97%   BMI 28.08 kg/m  , BMI Body mass index is 28.08 kg/m. GEN: Well nourished, well developed, in no acute distress  HEENT: normal  Neck: no JVD, carotid bruits, or masses Cardiac: RRR; no murmurs, rubs, or gallops,no edema  Respiratory:  clear to auscultation bilaterally, normal work of breathing GI: soft, nontender, nondistended, +  BS MS: no deformity or atrophy  Skin: warm and dry, no rash Neuro:  Strength and sensation are intact Psych: euthymic mood, full affect   EKG:  EKG is ordered today. The ekg ordered today demonstrates normal sinus rhythm with no significant ST or T wave changes.   Recent Labs: 11/18/2018: TSH 2.980 08/30/2019: BUN 18; Creatinine, Ser 0.73; Potassium 4.5; Sodium 140 10/18/2019: ALT 23    Lipid Panel    Component Value Date/Time   CHOL 201 (H) 10/18/2019 1100   CHOL 266 (H) 11/18/2018 0848   TRIG 134 10/18/2019 1100   HDL 69 10/18/2019 1100   HDL 75 11/18/2018 0848   CHOLHDL 2.9 10/18/2019 1100  VLDL 27 10/18/2019 1100   LDLCALC 105 (H) 10/18/2019 1100   LDLCALC 175 (H) 11/18/2018 0848      Wt Readings from Last 3 Encounters:  11/01/19 174 lb (78.9 kg)  08/11/19 170 lb 2 oz (77.2 kg)  05/24/19 173 lb 3.2 oz (78.6 kg)       PAD Screen 08/11/2019  Previous PAD dx? No  Previous surgical procedure? No  Pain with walking? No  Feet/toe relief with dangling? No  Painful, non-healing ulcers? No  Extremities discolored? No      ASSESSMENT AND PLAN:  1.  Coronary artery disease involving native coronary arteries without angina: CTA showed mild to moderate nonobstructive coronary artery disease with elevated calcium score.  Recommend aggressive treatment of risk factors.  Continue aspirin 81 mg once daily.    2.  Hyperlipidemia: She is tolerating rosuvastatin 20 mg daily.  Her LDL improved to 105 and since then Zetia 10 mg daily was added.  Recommend a target LDL of less than 70.   Disposition:   FU with me in 6 months.  Signed,  Kathlyn Sacramento, MD  11/01/2019 3:55 PM    [place]

## 2019-11-22 ENCOUNTER — Encounter: Payer: Self-pay | Admitting: Internal Medicine

## 2019-11-22 ENCOUNTER — Ambulatory Visit (INDEPENDENT_AMBULATORY_CARE_PROVIDER_SITE_OTHER): Payer: 59 | Admitting: Internal Medicine

## 2019-11-22 ENCOUNTER — Other Ambulatory Visit: Payer: Self-pay

## 2019-11-22 VITALS — BP 136/76 | HR 80 | Temp 98.6°F | Ht 66.0 in | Wt 174.0 lb

## 2019-11-22 DIAGNOSIS — Z23 Encounter for immunization: Secondary | ICD-10-CM

## 2019-11-22 DIAGNOSIS — K219 Gastro-esophageal reflux disease without esophagitis: Secondary | ICD-10-CM

## 2019-11-22 DIAGNOSIS — Z0000 Encounter for general adult medical examination without abnormal findings: Secondary | ICD-10-CM | POA: Diagnosis not present

## 2019-11-22 DIAGNOSIS — E785 Hyperlipidemia, unspecified: Secondary | ICD-10-CM

## 2019-11-22 DIAGNOSIS — Z853 Personal history of malignant neoplasm of breast: Secondary | ICD-10-CM | POA: Diagnosis not present

## 2019-11-22 LAB — POCT URINALYSIS DIPSTICK
Bilirubin, UA: NEGATIVE
Blood, UA: NEGATIVE
Glucose, UA: NEGATIVE
Ketones, UA: NEGATIVE
Leukocytes, UA: NEGATIVE
Nitrite, UA: NEGATIVE
Protein, UA: NEGATIVE
Spec Grav, UA: 1.01 (ref 1.010–1.025)
Urobilinogen, UA: 0.2 E.U./dL
pH, UA: 6.5 (ref 5.0–8.0)

## 2019-11-22 NOTE — Progress Notes (Signed)
Date:  11/22/2019   Name:  [name]   DOB:  1950/12/09   MRN:  [id number]   Chief Complaint: Annual Exam (breast exam/ no pap/ [id number])  [name] is a 69 y.o. female who presents today for her Complete Annual Exam. She feels well. She reports exercising walking 2-3 days a week. She reports she is sleeping well. Breast complaints none.  Mammogram: 12/2018  DEXA: 12/2016 Normal Colonoscopy: 09/2013  Immunization History  Administered Date(s) Administered  . Influenza-Unspecified 12/18/2014, 11/26/2017, 01/24/2019  . PFIZER SARS-COV-2 Vaccination 03/08/2019, 03/29/2019  . Pneumococcal Conjugate-13 11/17/2018  . Tdap 07/16/2017  . Zoster 03/19/2011  . Zoster Recombinat (Shingrix) 01/24/2019, 04/18/2019    Gastroesophageal Reflux She reports no abdominal pain, no chest pain, no coughing or no wheezing. Pertinent negatives include no fatigue. She has tried a PPI for the symptoms. The treatment provided significant relief.  Hyperlipidemia This is a chronic problem. The problem is controlled. Pertinent negatives include no chest pain or shortness of breath. Current antihyperlipidemic treatment includes statins (zetia added last visit after LDL 105).  She recently had some cardiac issues and eventually had a Coronary CT which showed some atherosclerosis.  She is now on Crestor and Zetia without side effects.  Lab Results  Component Value Date   CREATININE 0.73 08/30/2019   BUN 18 08/30/2019   NA 140 08/30/2019   K 4.5 08/30/2019   CL 106 08/30/2019   CO2 25 08/30/2019   Lab Results  Component Value Date   CHOL 201 (H) 10/18/2019   HDL 69 10/18/2019   LDLCALC 105 (H) 10/18/2019   TRIG 134 10/18/2019   CHOLHDL 2.9 10/18/2019   Lab Results  Component Value Date   TSH 2.980 11/18/2018   No results found for: HGBA1C Lab Results  Component Value Date   WBC 6.1 04/01/2018   HGB 14.3 04/01/2018   HCT 43.7 04/01/2018   MCV 95.2 04/01/2018   PLT 272 04/01/2018    Lab Results  Component Value Date   ALT 23 10/18/2019   AST 29 10/18/2019   ALKPHOS 62 10/18/2019   BILITOT 0.7 10/18/2019     Review of Systems  Constitutional: Negative for chills, fatigue and fever.  HENT: Negative for congestion, hearing loss, tinnitus, trouble swallowing and voice change.   Eyes: Negative for visual disturbance.  Respiratory: Negative for cough, chest tightness, shortness of breath and wheezing.   Cardiovascular: Negative for chest pain, palpitations and leg swelling.  Gastrointestinal: Negative for abdominal pain, constipation, diarrhea and vomiting.       Occasional gerd  Endocrine: Negative for polydipsia and polyuria.  Genitourinary: Negative for dysuria, frequency, genital sores, vaginal bleeding and vaginal discharge.  Musculoskeletal: Negative for arthralgias (mild OA fingers), gait problem and joint swelling.  Skin: Negative for color change and rash.  Neurological: Negative for dizziness, tremors, light-headedness and headaches.  Hematological: Negative for adenopathy. Does not bruise/bleed easily.  Psychiatric/Behavioral: Negative for dysphoric mood and sleep disturbance. The patient is not nervous/anxious.     Patient Active Problem List   Diagnosis Date Noted  . Coronary artery disease involving native coronary artery of native heart without angina pectoris 11/01/2019  . Gastroesophageal reflux disease 11/17/2018  . Acquired absence of breast 02/09/2018  . Breast asymmetry following reconstructive surgery 02/09/2018  . Hot flashes 05/28/2016  . Environmental and seasonal allergies 12/18/2014  . History of carcinoma in situ of vulva 12/18/2014  . Alopecia 12/18/2014  . Edema extremities 12/18/2014  .  History of hepatitis B virus infection 12/18/2014  . Hx of breast cancer 12/18/2014  . Hyperlipidemia, mild 12/18/2014    Allergies  Allergen Reactions  . Penicillins Rash    Has patient had a PCN reaction causing immediate rash,  facial/tongue/throat swelling, SOB or lightheadedness with hypotension: No Has patient had a PCN reaction causing severe rash involving mucus membranes or skin necrosis: No Has patient had a PCN reaction that required hospitalization: No Has patient had a PCN reaction occurring within the last 10 years: No If all of the above answers are "NO", then may proceed with Cephalosporin use.'  . Sulfa Antibiotics Rash    Past Surgical History:  Procedure Laterality Date  . ABDOMINAL HYSTERECTOMY  1997  . AUGMENTATION MAMMAPLASTY Right 72/0947   Silicone  . BREAST BIOPSY Right    Positive  . BREAST EXCISIONAL BIOPSY Left 02/17/2018   Removed fat necrosis   . BREAST REDUCTION SURGERY Left 01/18/2015   Procedure: MAMMARY REDUCTION  (BREAST);  Surgeon: Nicholaus Bloom, MD;  Location: [place];  Service: Plastics;  Laterality: Left;  . BREAST REDUCTION SURGERY Left 02/17/2018   Procedure: Left breast reduction with excision of fat necrosis-no liposuction needed;  Surgeon: Wallace Going, DO;  Location: [facility];  Service: Plastics;  Laterality: Left;  . PLACEMENT OF BREAST IMPLANTS Right 02/17/2018   Procedure: PLACEMENT OF BREAST IMPLANTS;  Surgeon: Wallace Going, DO;  Location: [facility];  Service: Plastics;  Laterality: Right;  . REDUCTION MAMMAPLASTY Left 02/2015   breast reduction  . TISSUE EXPANDER PLACEMENT Right 01/18/2015   Procedure: Right  BREAST EXPANDER PLACEMENT WITH ALLODERM;  Surgeon: Nicholaus Bloom, MD;  Location: [place];  Service: Plastics;  Laterality: Right;  DR COAN RM 2 PER BRENDA  . TISSUE EXPANDER PLACEMENT Right 03/13/2015   Procedure: Right breast tissue expander removal and Sillicone implant placement.;  Surgeon: Nicholaus Bloom, MD;  Location: [place];  Service: Plastics;  Laterality: Right;  . TOTAL MASTECTOMY Right 02/2013   stage 1 ER/PR+  . VULVECTOMY      Social History   Tobacco Use  . Smoking status: Never Smoker  . Smokeless  tobacco: Never Used  Vaping Use  . Vaping Use: Never used  Substance Use Topics  . Alcohol use: Yes    Alcohol/week: 5.0 standard drinks    Types: 5 Glasses of wine per week  . Drug use: No     Medication list has been reviewed and updated.  Current Meds  Medication Sig  . aspirin EC 81 MG tablet Take 1 tablet (81 mg total) by mouth daily. Swallow whole.  . Calcium Carbonate-Vitamin D (CALCIUM + D PO) Take 1 tablet by mouth 2 (two) times daily.   Marland Kitchen esomeprazole (NEXIUM) 20 MG capsule Take 20 mg by mouth daily at 12 noon.  . ezetimibe (ZETIA) 10 MG tablet Take 1 tablet (10 mg total) by mouth daily.  . fexofenadine (ALLEGRA) 180 MG tablet Take 180 mg by mouth daily as needed for allergies.   . Magnesium 500 MG TABS Take 500 mg by mouth daily.   . rosuvastatin (CRESTOR) 20 MG tablet Take 1 tablet (20 mg total) by mouth daily.  Marland Kitchen triamcinolone (NASACORT ALLERGY 24HR) 55 MCG/ACT AERO nasal inhaler Place 2 sprays into the nose daily as needed (allergies).   . Turmeric (QC TUMERIC COMPLEX) 500 MG CAPS Take by mouth. Taking 2 caps daily    PHQ 2/9 Scores 11/22/2019 11/17/2018 07/16/2017 08/31/2015  PHQ -  2 Score 0 0 0 0  PHQ- 9 Score 0 0 - -    GAD 7 : Generalized Anxiety Score 11/22/2019  Nervous, Anxious, on Edge 0  Control/stop worrying 0  Worry too much - different things 0  Trouble relaxing 0  Restless 0  Easily annoyed or irritable 0  Afraid - awful might happen 0  Total GAD 7 Score 0  Anxiety Difficulty Not difficult at all    BP Readings from Last 3 Encounters:  11/22/19 136/76  11/01/19 124/78  09/08/19 121/75    Physical Exam Vitals and nursing note reviewed.  Constitutional:      General: She is not in acute distress.    Appearance: She is well-developed.  HENT:     Head: Normocephalic and atraumatic.     Right Ear: Tympanic membrane and ear canal normal.     Left Ear: Tympanic membrane and ear canal normal.     Nose:     Right Sinus: No maxillary sinus  tenderness.     Left Sinus: No maxillary sinus tenderness.  Eyes:     General: No scleral icterus.       Right eye: No discharge.        Left eye: No discharge.     Conjunctiva/sclera: Conjunctivae normal.  Neck:     Thyroid: No thyromegaly.     Vascular: No carotid bruit.  Cardiovascular:     Rate and Rhythm: Normal rate and regular rhythm.     Pulses: Normal pulses.     Heart sounds: Normal heart sounds.  Pulmonary:     Effort: Pulmonary effort is normal. No respiratory distress.     Breath sounds: No wheezing.  Chest:     Comments: Right breast absent - implant soft, no skin changes  Left breast s/p reduction with healed scars, soft with postsurgical changes Abdominal:     General: Bowel sounds are normal.     Palpations: Abdomen is soft.     Tenderness: There is no abdominal tenderness.  Musculoskeletal:     Cervical back: Normal range of motion. No erythema.     Right lower leg: No edema.     Left lower leg: No edema.  Lymphadenopathy:     Cervical: No cervical adenopathy.  Skin:    General: Skin is warm and dry.     Capillary Refill: Capillary refill takes less than 2 seconds.     Findings: No rash.  Neurological:     General: No focal deficit present.     Mental Status: She is alert and oriented to person, place, and time.     Cranial Nerves: No cranial nerve deficit.     Sensory: No sensory deficit.     Deep Tendon Reflexes: Reflexes are normal and symmetric.  Psychiatric:        Attention and Perception: Attention normal.        Mood and Affect: Mood normal.     Wt Readings from Last 3 Encounters:  11/22/19 174 lb (78.9 kg)  11/01/19 174 lb (78.9 kg)  08/11/19 170 lb 2 oz (77.2 kg)    BP 136/76   Pulse 80   Temp 98.6 F (37 C) (Oral)   Ht 5\' 6"  (1.676 m)   Wt 174 lb (78.9 kg)   SpO2 98%   BMI 28.08 kg/m   Assessment and Plan: 1. Annual physical exam Normal exam Continue healthy diet, regular exercise - POCT urinalysis dipstick  2.  Gastroesophageal reflux disease, unspecified  whether esophagitis present Symptoms well controlled on daily PPI No red flag signs such as weight loss, n/v, melena Will continue nexium.  3. Hyperlipidemia, mild Now on high intensity statin plus zetia Follow up with Dr. Fletcher Anon  4. Hx of breast cancer - MM Digital Screening Unilat L; Future  5. Need for vaccination for pneumococcus - Pneumococcal polysaccharide vaccine 23-valent greater than or equal to 2yo subcutaneous/IM   Partially dictated using Editor, commissioning. Any errors are unintentional.  Halina Maidens, MD [facility]  11/22/2019

## 2019-11-28 ENCOUNTER — Other Ambulatory Visit: Payer: Self-pay | Admitting: Internal Medicine

## 2019-11-28 DIAGNOSIS — Z1231 Encounter for screening mammogram for malignant neoplasm of breast: Secondary | ICD-10-CM

## 2020-01-10 ENCOUNTER — Other Ambulatory Visit: Payer: Self-pay

## 2020-01-10 ENCOUNTER — Ambulatory Visit
Admission: RE | Admit: 2020-01-10 | Discharge: 2020-01-10 | Disposition: A | Discharge status: Home / Self Care | Payer: 59 | Source: Ambulatory Visit | Attending: Internal Medicine | Admitting: Internal Medicine

## 2020-01-10 DIAGNOSIS — Z1231 Encounter for screening mammogram for malignant neoplasm of breast: Secondary | ICD-10-CM | POA: Insufficient documentation

## 2020-02-03 ENCOUNTER — Other Ambulatory Visit: Payer: Self-pay

## 2020-02-03 ENCOUNTER — Other Ambulatory Visit
Admission: RE | Admit: 2020-02-03 | Discharge: 2020-02-03 | Disposition: A | Discharge status: Home / Self Care | Payer: 59 | Attending: Cardiovascular Disease | Admitting: Cardiovascular Disease

## 2020-02-03 DIAGNOSIS — E785 Hyperlipidemia, unspecified: Secondary | ICD-10-CM | POA: Diagnosis not present

## 2020-02-03 LAB — HEPATIC FUNCTION PANEL
ALT: 20 U/L (ref 0–44)
AST: 26 U/L (ref 15–41)
Albumin: 3.9 g/dL (ref 3.5–5.0)
Alkaline Phosphatase: 58 U/L (ref 38–126)
Bilirubin, Direct: <0.1 mg/dL (ref 0.0–0.2)
Total Bilirubin: 0.7 mg/dL (ref 0.3–1.2)
Total Protein: 7.2 g/dL (ref 6.5–8.1)

## 2020-02-03 LAB — LIPID PANEL
Cholesterol: 148 mg/dL (ref 0–200)
HDL: 71 mg/dL (ref >40)
LDL Cholesterol: 60 mg/dL (ref 0–99)
Total CHOL/HDL Ratio: 2.1 RATIO
Triglycerides: 87 mg/dL (ref <150)
VLDL: 17 mg/dL (ref 0–40)

## 2020-03-07 DIAGNOSIS — H2513 Age-related nuclear cataract, bilateral: Secondary | ICD-10-CM | POA: Diagnosis not present

## 2020-05-22 ENCOUNTER — Encounter: Payer: Self-pay | Admitting: Cardiovascular Disease

## 2020-05-22 ENCOUNTER — Ambulatory Visit (INDEPENDENT_AMBULATORY_CARE_PROVIDER_SITE_OTHER): Payer: Medicare Other | Admitting: Plastic Surgery

## 2020-05-22 ENCOUNTER — Ambulatory Visit (INDEPENDENT_AMBULATORY_CARE_PROVIDER_SITE_OTHER): Payer: Medicare Other | Admitting: Cardiovascular Disease

## 2020-05-22 ENCOUNTER — Encounter: Payer: Self-pay | Admitting: Plastic Surgery

## 2020-05-22 ENCOUNTER — Other Ambulatory Visit: Payer: Self-pay

## 2020-05-22 VITALS — BP 160/90 | HR 85 | Ht 66.0 in | Wt 173.0 lb

## 2020-05-22 VITALS — BP 132/82 | HR 84

## 2020-05-22 DIAGNOSIS — I2510 Atherosclerotic heart disease of native coronary artery without angina pectoris: Secondary | ICD-10-CM

## 2020-05-22 DIAGNOSIS — I10 Essential (primary) hypertension: Secondary | ICD-10-CM | POA: Diagnosis not present

## 2020-05-22 DIAGNOSIS — N651 Disproportion of reconstructed breast: Secondary | ICD-10-CM | POA: Diagnosis not present

## 2020-05-22 DIAGNOSIS — Z9011 Acquired absence of right breast and nipple: Secondary | ICD-10-CM

## 2020-05-22 DIAGNOSIS — E785 Hyperlipidemia, unspecified: Secondary | ICD-10-CM

## 2020-05-22 MED ORDER — EZETIMIBE 10 MG PO TABS: 10.0000 mg | ORAL_TABLET | Freq: Every day | ORAL | 3 refills | Status: DC

## 2020-05-22 MED ORDER — ROSUVASTATIN CALCIUM 20 MG PO TABS: 20.0000 mg | ORAL_TABLET | Freq: Every day | ORAL | 3 refills | Status: DC

## 2020-05-22 MED ORDER — HYDROCHLOROTHIAZIDE 12.5 MG PO CAPS
12.5000 mg | ORAL_CAPSULE | Freq: Every day | ORAL | 3 refills | Status: DC
Start: 2020-05-22 — End: 2020-11-27

## 2020-05-22 NOTE — Patient Instructions (Signed)
Medication Instructions:  Your physician has recommended you make the following change in your medication:   START HCTZ 12.5 mg daily. An Rx has been sent to your pharmacy.  *If you need a refill on your cardiac medications before your next appointment, please call your pharmacy*   Lab Work: Your physician recommends that you return for lab work (bmp) in: 1 week  Please have your lab drawn at the [place] [facility] lab. No appt needed.  If you have labs (blood work) drawn today and your tests are completely normal, you will receive your results only by: Marland Kitchen MyChart Message (if you have MyChart) OR . A paper copy in the mail If you have any lab test that is abnormal or we need to change your treatment, we will call you to review the results.   Testing/Procedures: None ordered   Follow-Up: At [facility], you and your health needs are our priority.  As part of our continuing mission to provide you with exceptional heart care, we have created designated Provider Care Teams.  These Care Teams include your primary Cardiologist (physician) and Advanced Practice Providers (APPs -  Physician Assistants and Nurse Practitioners) who all work together to provide you with the care you need, when you need it.  We recommend signing up for the patient portal called "MyChart".  Sign up information is provided on this After Visit Summary.  MyChart is used to connect with patients for Virtual Visits (Telemedicine).  Patients are able to view lab/test results, encounter notes, upcoming appointments, etc.  Non-urgent messages can be sent to your provider as well.   To learn more about what you can do with MyChart, go to [email or web address].    Your next appointment:   Your physician wants you to follow-up in: 6 months You will receive a reminder letter in the mail two months in advance. If you don't receive a letter, please call our office to schedule the follow-up appointment.   The format for your  next appointment:   In Person  Provider:   You may see Kathlyn Sacramento, MD or one of the following Advanced Practice Providers on your designated Care Team:    Murray Hodgkins, NP  Christell Faith, PA-C  Marrianne Mood, PA-C  Cadence Whitehall, [place]  Laurann Montana, NP    Other Instructions N/A

## 2020-05-22 NOTE — Progress Notes (Signed)
   Subjective:    Patient ID: [name], female    DOB: 03-13-1951, 70 y.o.   MRN: [id number]  Patient is a 70 year old female here for a 1 year follow-up after undergoing breast reconstruction.  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  In 2014 she had a right mastectomy with reconstruction.  She had some trouble with the implants and some asymmetry.  The Allergan Inspira 445 cc implant was removed in 2019 and a Mentor smooth round high profile 400 cc gel implant was placed.  We did a mastopexy on the left breast.  She had some redness and skin breakdown of the left areola after the surgery and did really well with course of doxycycline.  She has a little bit of firmness in that area but nothing that bothers her.  She says occasionally she feels some tingling in the area.  She is very happy with the results.  She is not interested in nipple areola tattooing at this time.  She is 5 feet 6 inches tall weighs 171 pounds.  No lumps or bumps noted on exam.  No capsular contracture.  She has been doing a lot of walking since her retirement.     Review of Systems  Constitutional: Negative.   HENT: Negative.   Eyes: Negative.   Respiratory: Negative.   Cardiovascular: Negative.   Endocrine: Negative.   Genitourinary: Negative.   Musculoskeletal: Negative.   Hematological: Negative.   Psychiatric/Behavioral: Negative.        Objective:   Physical Exam Vitals and nursing note reviewed.  Constitutional:      Appearance: Normal appearance.  HENT:     Head: Normocephalic and atraumatic.  Cardiovascular:     Rate and Rhythm: Normal rate.     Pulses: Normal pulses.  Pulmonary:     Effort: Pulmonary effort is normal.  Abdominal:     General: Abdomen is flat. There is no distension.  Musculoskeletal:        General: No swelling or deformity.  Skin:    General: Skin is warm.     Capillary Refill: Capillary refill takes less than 2 seconds.     Coloration: Skin is  jaundiced.     Findings: No bruising or lesion.  Neurological:     General: No focal deficit present.     Mental Status: She is alert and oriented to person, place, and time.  Psychiatric:        Mood and Affect: Mood normal.        Behavior: Behavior normal.        Assessment & Plan:     ICD-10-CM   1. Breast asymmetry following reconstructive surgery  N65.1   2. Acquired absence of right breast  Z90.11     The patient has done extremely well since her surgeries.  She might be interested in ultrasound for evaluation of the implant at her next visit.  She is not worried about it at this time.  I would like to see her back in a year.  She knows to call me if she has any questions or concerns. Pictures were obtained of the patient and placed in the chart with the patient's or guardian's permission.

## 2020-05-22 NOTE — Progress Notes (Signed)
Cardiology Office Note   Date:  05/22/2020   ID:  [name], DOB 1950-09-30, MRN [id number]  PCP:  Glean Hess, MD  Cardiologist:   Kathlyn Sacramento, MD   Chief Complaint  Patient presents with  . Follow-up    6 month F/U-Patient reports ankle edema and DOE      History of Present Illness: [name] Sabrina a 70 y.o. [name] who Sabrina here today for follow-up visit regarding mild to moderate coronary artery disease.   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  Carotid Doppler in March of 2021 showed no significant disease.  She was evaluated last year for exertional dyspnea.  She underwent cardiac CTA in June 2021 which showed a calcium score of 274 which was in the 90th percentile for age and sex matched control.  There was evidence of moderate stenosis in the mid LAD and mild disease in the proximal LAD as well as ostial and distal right coronary artery.  None of the lesions were significant by FFR.  Echocardiogram showed normal LV systolic function and mild mitral regurgitation.  No evidence of pulmonary hypertension.   Based on CTA results, she will started on 81 mg once daily and rosuvastatin 20 mg daily.  Zetia was subsequently added.    He has been doing very well with no recent chest pain, shortness of breath or palpitations.  His blood pressure has been running high recently and she noticed some swelling in her legs.  She reports being on hydrochlorothiazide in the past.  She Sabrina currently not on any antihypertensive medications.   Past Medical History:  Diagnosis Date  . Arthritis   . Breast cancer ([place]) 02/2014  . Cancer ([place])    Vulvular  . GERD (gastroesophageal reflux disease)   . Hepatitis    Hep B in 1981  . PONV (postoperative nausea and vomiting)    pt states scopalamine patch helped and zofran and phenergan helped     Past Surgical History:  Procedure Laterality Date  . ABDOMINAL HYSTERECTOMY  1997  . AUGMENTATION MAMMAPLASTY Right 40/9811   Silicone  . BREAST BIOPSY Right    Positive  . BREAST EXCISIONAL BIOPSY Left 02/17/2018   Removed fat necrosis   . BREAST REDUCTION SURGERY Left 01/18/2015   Procedure: MAMMARY REDUCTION  (BREAST);  Surgeon: Nicholaus Bloom, MD;  Location: [place];  Service: Plastics;  Laterality: Left;  . BREAST REDUCTION SURGERY Left 02/17/2018   Procedure: Left breast reduction with excision of fat necrosis-no liposuction needed;  Surgeon: Wallace Going, DO;  Location: [facility];  Service: Plastics;  Laterality: Left;  . PLACEMENT OF BREAST IMPLANTS Right 02/17/2018   Procedure: PLACEMENT OF BREAST IMPLANTS;  Surgeon: Wallace Going, DO;  Location: [facility];  Service: Plastics;  Laterality: Right;  . REDUCTION MAMMAPLASTY Left 02/2015   breast reduction  . TISSUE EXPANDER PLACEMENT Right 01/18/2015   Procedure: Right  BREAST EXPANDER PLACEMENT WITH ALLODERM;  Surgeon: Nicholaus Bloom, MD;  Location: [place];  Service: Plastics;  Laterality: Right;  DR COAN RM 2 PER BRENDA  . TISSUE EXPANDER PLACEMENT Right 03/13/2015   Procedure: Right breast tissue expander removal and Sillicone implant placement.;  Surgeon: Nicholaus Bloom, MD;  Location: [place];  Service: Plastics;  Laterality: Right;  . TOTAL MASTECTOMY Right 02/2013   stage 1 ER/PR+  .  VULVECTOMY       Current Outpatient Medications  Medication Sig Dispense Refill  . aspirin EC 81 MG tablet Take 1 tablet (81 mg total) by mouth daily. Swallow whole.    . Calcium Carbonate-Vitamin D (CALCIUM + D PO) Take 1 tablet by mouth 2 (two) times daily.     Marland Kitchen esomeprazole (NEXIUM) 20 MG capsule Take 20 mg by mouth daily at 12 noon.    . ezetimibe (ZETIA) 10 MG tablet Take 1 tablet (10 mg total) by mouth daily. 90 tablet 3  . fexofenadine (ALLEGRA) 180 MG tablet Take 180 mg by mouth daily as  needed for allergies.     . Magnesium 500 MG TABS Take 500 mg by mouth daily.     . rosuvastatin (CRESTOR) 20 MG tablet Take 1 tablet (20 mg total) by mouth daily. 90 tablet 3  . triamcinolone (NASACORT) 55 MCG/ACT AERO nasal inhaler Place 2 sprays into the nose daily as needed (allergies).     . Turmeric 500 MG CAPS Take by mouth. Taking 2 caps daily     No current facility-administered medications for this visit.    Allergies:   Penicillins and Sulfa antibiotics    Social History:  The patient  reports that she has never smoked. She has never used smokeless tobacco. She reports current alcohol use. She reports that she does not use drugs.   Family History:  The patient's family history includes Breast cancer in her cousin and paternal grandmother; Diabetes in her brother and father; Hypertension in her father and mother; Leukemia in her maternal grandmother; Stroke in her father; Thyroid disease in her mother.    ROS:  Please see the history of present illness.   Otherwise, review of systems are positive for none.   All other systems are reviewed and negative.    PHYSICAL EXAM: VS:  BP (!) 160/90 (BP Location: Left Arm, Patient Position: Sitting, Cuff Size: Normal)   Pulse 85   Ht 5\' 6"  (1.676 m)   Wt 173 lb (78.5 kg)   SpO2 98%   BMI 27.92 kg/m  , BMI Body mass index Sabrina 27.92 kg/m. GEN: Well nourished, well developed, in no acute distress  HEENT: normal  Neck: no JVD, carotid bruits, or masses Cardiac: RRR; no murmurs, rubs, or gallops, trace bilateral leg edema Respiratory:  clear to auscultation bilaterally, normal work of breathing GI: soft, nontender, nondistended, + BS MS: no deformity or atrophy  Skin: warm and dry, no rash Neuro:  Strength and sensation are intact Psych: euthymic mood, full affect   EKG:  EKG Sabrina ordered today. The ekg ordered today demonstrates normal sinus rhythm with no significant ST or T wave changes.  Recent Labs: 08/30/2019: BUN 18;  Creatinine, Ser 0.73; Potassium 4.5; Sodium 140 02/03/2020: ALT 20    Lipid Panel    Component Value Date/Time   CHOL 148 02/03/2020 0757   CHOL 266 (H) 11/18/2018 0848   TRIG 87 02/03/2020 0757   HDL 71 02/03/2020 0757   HDL 75 11/18/2018 0848   CHOLHDL 2.1 02/03/2020 0757   VLDL 17 02/03/2020 0757   LDLCALC 60 02/03/2020 0757   LDLCALC 175 (H) 11/18/2018 0848      Wt Readings from Last 3 Encounters:  05/22/20 173 lb (78.5 kg)  11/22/19 174 lb (78.9 kg)  11/01/19 174 lb (78.9 kg)       PAD Screen 08/11/2019  Previous PAD dx? No  Previous surgical procedure? No  Pain with walking?  No  Feet/toe relief with dangling? No  Painful, non-healing ulcers? No  Extremities discolored? No      ASSESSMENT AND PLAN:  1.  Coronary artery disease involving native coronary arteries without angina: CTA showed mild to moderate nonobstructive coronary artery disease with elevated calcium score.  Recommend aggressive treatment of risk factors.  Continue aspirin 81 mg once daily.    2.  Hyperlipidemia: Continue treatment with rosuvastatin and Zetia.  Most recent lipid profile in November showed significant improvement in LDL down to 60 with an HDL of 71 and triglyceride of 87.    3.  Essential hypertension: Blood pressure Sabrina elevated today.  I elected to add small dose hydrochlorothiazide 12.5 mg once daily.  Check asymptomatic profile in 1 week.    Disposition:   FU with me in 6 months.  Signed,  Kathlyn Sacramento, MD  05/22/2020 3:21 PM    [facility]

## 2020-06-06 ENCOUNTER — Other Ambulatory Visit: Payer: Self-pay

## 2020-06-06 ENCOUNTER — Other Ambulatory Visit
Admission: RE | Admit: 2020-06-06 | Discharge: 2020-06-06 | Disposition: A | Discharge status: Home / Self Care | Payer: Medicare Other | Attending: Cardiovascular Disease | Admitting: Cardiovascular Disease

## 2020-06-06 DIAGNOSIS — I2510 Atherosclerotic heart disease of native coronary artery without angina pectoris: Secondary | ICD-10-CM | POA: Insufficient documentation

## 2020-06-06 LAB — BASIC METABOLIC PANEL
Anion gap: 9 (ref 5–15)
BUN: 12 mg/dL (ref 8–23)
CO2: 28 mmol/L (ref 22–32)
Calcium: 9.2 mg/dL (ref 8.9–10.3)
Chloride: 100 mmol/L (ref 98–111)
Creatinine, Ser: 0.74 mg/dL (ref 0.44–1.00)
GFR, Estimated: >60 mL/min (ref >60)
Glucose, Bld: 106 mg/dL — ABNORMAL HIGH (ref 70–99)
Potassium: 4 mmol/L (ref 3.5–5.1)
Sodium: 137 mmol/L (ref 135–145)

## 2020-08-29 ENCOUNTER — Other Ambulatory Visit: Payer: Self-pay

## 2020-08-29 ENCOUNTER — Encounter: Payer: Self-pay | Admitting: Internal Medicine

## 2020-08-29 MED ORDER — ESOMEPRAZOLE MAGNESIUM 20 MG PO CPDR: 20.0000 mg | DELAYED_RELEASE_CAPSULE | Freq: Every day | ORAL | 5 refills | Status: DC

## 2020-11-23 ENCOUNTER — Ambulatory Visit (INDEPENDENT_AMBULATORY_CARE_PROVIDER_SITE_OTHER): Payer: Medicare Other | Admitting: Internal Medicine

## 2020-11-23 ENCOUNTER — Other Ambulatory Visit: Payer: Self-pay

## 2020-11-23 ENCOUNTER — Other Ambulatory Visit: Payer: Self-pay | Admitting: Internal Medicine

## 2020-11-23 ENCOUNTER — Encounter: Payer: Self-pay | Admitting: Internal Medicine

## 2020-11-23 VITALS — BP 126/78 | HR 64 | Ht 66.0 in | Wt 178.0 lb

## 2020-11-23 DIAGNOSIS — K219 Gastro-esophageal reflux disease without esophagitis: Secondary | ICD-10-CM

## 2020-11-23 DIAGNOSIS — N644 Mastodynia: Secondary | ICD-10-CM

## 2020-11-23 DIAGNOSIS — M7071 Other bursitis of hip, right hip: Secondary | ICD-10-CM

## 2020-11-23 DIAGNOSIS — E785 Hyperlipidemia, unspecified: Secondary | ICD-10-CM | POA: Diagnosis not present

## 2020-11-23 DIAGNOSIS — Z23 Encounter for immunization: Secondary | ICD-10-CM

## 2020-11-23 DIAGNOSIS — I2510 Atherosclerotic heart disease of native coronary artery without angina pectoris: Secondary | ICD-10-CM

## 2020-11-23 DIAGNOSIS — Z1231 Encounter for screening mammogram for malignant neoplasm of breast: Secondary | ICD-10-CM | POA: Diagnosis not present

## 2020-11-23 DIAGNOSIS — Z0000 Encounter for general adult medical examination without abnormal findings: Secondary | ICD-10-CM | POA: Diagnosis not present

## 2020-11-23 DIAGNOSIS — Z13820 Encounter for screening for osteoporosis: Secondary | ICD-10-CM

## 2020-11-23 MED ORDER — TRAMADOL HCL 50 MG PO TABS: 50.0000 mg | ORAL_TABLET | Freq: Four times a day (QID) | ORAL | 0 refills | Status: AC | PRN

## 2020-11-23 NOTE — Progress Notes (Signed)
Date:  11/23/2020   Name:  [name]   DOB:  1950/10/01   MRN:  [id number]   Chief Complaint: Annual Exam (Breast Exam. No pap- aged out.) [name] is a 70 y.o. female who presents today for her Complete Annual Exam. She feels well. She reports exercising  - walk every day depending on weather, water aerobics once a week, and kayaking every now and then. She reports she is sleeping well. Breast complaints - right breast implant. Flu shot today.    Mammogram: 12/2019 DEXA: 12/2016 Normal Pap smear: discontinued Colonoscopy: 09/2013   Immunization History  Administered Date(s) Administered   Fluad Quad(high Dose 65+) 11/23/2020   Influenza-Unspecified 12/18/2014, 11/26/2017, 01/24/2019   PFIZER(Purple Top)SARS-COV-2 Vaccination 03/08/2019, 03/29/2019   Pneumococcal Conjugate-13 11/17/2018   Pneumococcal Polysaccharide-23 11/22/2019   Tdap 07/16/2017   Zoster Recombinat (Shingrix) 01/24/2019, 04/18/2019   Zoster, Live 03/19/2011    Gastroesophageal Reflux She complains of heartburn. She reports no abdominal pain, no chest pain, no coughing or no wheezing. This is a recurrent problem. The problem occurs occasionally. Pertinent negatives include no fatigue. She has tried a PPI for the symptoms. The treatment provided significant relief.  Hyperlipidemia This is a chronic problem. The problem is controlled. Pertinent negatives include no chest pain or shortness of breath. Current antihyperlipidemic treatment includes statins and ezetimibe.  CAD - She underwent cardiac CTA in June 2021 which showed a calcium score of 274 which was in the 90th percentile for age and sex matched control.  There was evidence of moderate stenosis in the mid LAD and mild disease in the proximal LAD as well as ostial and distal right coronary artery.  None of the lesions were significant by FFR.  Echocardiogram showed normal LV systolic function and mild mitral regurgitation. Based on CTA results, she  will started on 81 mg once daily and rosuvastatin 20 mg daily.  Zetia was subsequently added.  Lab Results  Component Value Date   CREATININE 0.74 06/06/2020   BUN 12 06/06/2020   NA 137 06/06/2020   K 4.0 06/06/2020   CL 100 06/06/2020   CO2 28 06/06/2020   Lab Results  Component Value Date   CHOL 148 02/03/2020   HDL 71 02/03/2020   LDLCALC 60 02/03/2020   TRIG 87 02/03/2020   CHOLHDL 2.1 02/03/2020   Lab Results  Component Value Date   TSH 2.980 11/18/2018   No results found for: HGBA1C Lab Results  Component Value Date   WBC 6.1 04/01/2018   HGB 14.3 04/01/2018   HCT 43.7 04/01/2018   MCV 95.2 04/01/2018   PLT 272 04/01/2018   Lab Results  Component Value Date   ALT 20 02/03/2020   AST 26 02/03/2020   ALKPHOS 58 02/03/2020   BILITOT 0.7 02/03/2020     Review of Systems  Constitutional:  Negative for chills, fatigue and fever.  HENT:  Negative for congestion, hearing loss, tinnitus, trouble swallowing and voice change.   Eyes:  Negative for visual disturbance.  Respiratory:  Negative for cough, chest tightness, shortness of breath and wheezing.   Cardiovascular:  Negative for chest pain, palpitations and leg swelling.  Gastrointestinal:  Positive for heartburn. Negative for abdominal pain, constipation, diarrhea and vomiting.  Endocrine: Negative for polydipsia and polyuria.  Genitourinary:  Negative for dysuria, frequency, genital sores, vaginal bleeding and vaginal discharge.  Musculoskeletal:  Negative for arthralgias, gait problem and joint swelling.  Skin:  Negative for color change and rash.  Neurological:  Negative for dizziness, tremors, light-headedness and headaches.  Hematological:  Negative for adenopathy. Does not bruise/bleed easily.  Psychiatric/Behavioral:  Negative for dysphoric mood and sleep disturbance. The patient is not nervous/anxious.    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/18/2014   Alopecia 12/18/2014   Edema extremities 12/18/2014   History of hepatitis B virus infection 12/18/2014   Hx of breast cancer 12/18/2014   Hyperlipidemia, mild 12/18/2014    Allergies  Allergen Reactions   Penicillins Rash    Has patient had a PCN reaction causing immediate rash, facial/tongue/throat swelling, SOB or lightheadedness with hypotension: No Has patient had a PCN reaction causing severe rash involving mucus membranes or skin necrosis: No Has patient had a PCN reaction that required hospitalization: No Has patient had a PCN reaction occurring within the last 10 years: No If all of the above answers are "NO", then may proceed with Cephalosporin use.'   Sulfa Antibiotics Rash    Past Surgical History:  Procedure Laterality Date   ABDOMINAL HYSTERECTOMY  1997   AUGMENTATION MAMMAPLASTY Right 123XX123   Silicone   BREAST BIOPSY Right    Positive   BREAST EXCISIONAL BIOPSY Left 02/17/2018   Removed fat necrosis    BREAST REDUCTION SURGERY Left 01/18/2015   Procedure: MAMMARY REDUCTION  (BREAST);  Surgeon: Nicholaus Bloom, MD;  Location: [place];  Service: Plastics;  Laterality: Left;   BREAST REDUCTION SURGERY Left 02/17/2018   Procedure: Left breast reduction with excision of fat necrosis-no liposuction needed;  Surgeon: Wallace Going, DO;  Location: [facility];  Service: Plastics;  Laterality: Left;   PLACEMENT OF BREAST IMPLANTS Right 02/17/2018   Procedure: PLACEMENT OF BREAST IMPLANTS;  Surgeon: Wallace Going, DO;  Location: [facility];  Service: Plastics;  Laterality: Right;   TISSUE EXPANDER PLACEMENT Right  01/18/2015   Procedure: Right  BREAST EXPANDER PLACEMENT WITH ALLODERM;  Surgeon: Nicholaus Bloom, MD;  Location: [place];  Service: Plastics;  Laterality: Right;  DR Tula Nakayama RM 2 PER BRENDA   TISSUE EXPANDER PLACEMENT Right 03/13/2015   Procedure: Right breast tissue expander removal and Sillicone implant placement.;  Surgeon: Nicholaus Bloom, MD;  Location: [facility];  Service: Plastics;  Laterality: Right;   TOTAL MASTECTOMY Right 02/2013   stage 1 ER/PR+   VULVECTOMY      Social History   Tobacco Use   Smoking status: Never   Smokeless tobacco: Never  Vaping Use   Vaping Use: Never used  Substance Use Topics   Alcohol use: Yes    Comment: 3-5 glasses of wine weekly   Drug use: No     Medication list has been reviewed and updated.  Current Meds  Medication Sig   aspirin EC 81 MG tablet Take 1 tablet (81 mg total) by mouth daily. Swallow whole.   Calcium Carbonate-Vitamin D (CALCIUM + D PO) Take 1 tablet by mouth 2 (two) times daily.    esomeprazole (NEXIUM) 20 MG capsule Take 1 capsule (20 mg total) by mouth daily at 12 noon.   ezetimibe (ZETIA) 10 MG tablet Take 1 tablet (10 mg total) by mouth  daily.   fexofenadine (ALLEGRA) 180 MG tablet Take 180 mg by mouth daily as needed for allergies.    hydrochlorothiazide (MICROZIDE) 12.5 MG capsule Take 1 capsule (12.5 mg total) by mouth daily.   Magnesium 500 MG TABS Take 500 mg by mouth daily.    rosuvastatin (CRESTOR) 20 MG tablet Take 1 tablet (20 mg total) by mouth daily.   triamcinolone (NASACORT) 55 MCG/ACT AERO nasal inhaler Place 2 sprays into the nose daily as needed (allergies).    Turmeric 500 MG CAPS Take by mouth. Taking 2 caps daily    PHQ 2/9 Scores 11/23/2020 11/22/2019 11/17/2018 07/16/2017  PHQ - 2 Score 0 0 0 0  PHQ- 9 Score 2 0 0 -    GAD 7 : Generalized Anxiety Score 11/23/2020 11/22/2019  Nervous, Anxious, on Edge 0 0  Control/stop worrying 0 0  Worry too much - different things 1 0  Trouble relaxing 1 0   Restless 0 0  Easily annoyed or irritable 0 0  Afraid - awful might happen 0 0  Total GAD 7 Score 2 0  Anxiety Difficulty Not difficult at all Not difficult at all    BP Readings from Last 3 Encounters:  11/23/20 126/78  05/22/20 (!) 160/90  05/22/20 132/82    Physical Exam Vitals and nursing note reviewed.  Constitutional:      General: She is not in acute distress.    Appearance: She is well-developed.  HENT:     Head: Normocephalic and atraumatic.     Right Ear: Tympanic membrane and ear canal normal.     Left Ear: Tympanic membrane and ear canal normal.     Nose:     Right Sinus: No maxillary sinus tenderness.     Left Sinus: No maxillary sinus tenderness.  Eyes:     General: No scleral icterus.       Right eye: No discharge.        Left eye: No discharge.     Conjunctiva/sclera: Conjunctivae normal.  Neck:     Thyroid: No thyromegaly.     Vascular: No carotid bruit.  Cardiovascular:     Rate and Rhythm: Normal rate and regular rhythm.     Pulses: Normal pulses.     Heart sounds: Normal heart sounds.  Pulmonary:     Effort: Pulmonary effort is normal. No respiratory distress.     Breath sounds: No wheezing.  Chest:  Breasts:    Right: Absent.     Left: Skin change present. No nipple discharge.     Comments: Right mastectomy with reconstruction - no evidence of recurrence of breast cancer Left s/p reduction - scars healed, mild fibrocystic changes, no worrisome findings Abdominal:     General: Bowel sounds are normal.     Palpations: Abdomen is soft.     Tenderness: There is no abdominal tenderness.  Musculoskeletal:     Cervical back: Normal range of motion. No erythema.     Right lower leg: No edema.     Left lower leg: No edema.  Lymphadenopathy:     Cervical: No cervical adenopathy.  Skin:    General: Skin is warm and dry.     Findings: No rash.  Neurological:     Mental Status: She is alert and oriented to person, place, and time.     Cranial  Nerves: No cranial nerve deficit.     Sensory: No sensory deficit.     Deep Tendon Reflexes: Reflexes are normal and  symmetric.  Psychiatric:        Attention and Perception: Attention normal.        Mood and Affect: Mood normal.    Wt Readings from Last 3 Encounters:  11/23/20 178 lb (80.7 kg)  05/22/20 173 lb (78.5 kg)  11/22/19 174 lb (78.9 kg)    BP 126/78 (BP Location: Right Arm, Patient Position: Sitting, Cuff Size: Normal)   Pulse 64   Ht '5\' 6"'$  (1.676 m)   Wt 178 lb (80.7 kg)   BMI 28.73 kg/m   Assessment and Plan: 1. Annual physical exam Normal exam Continue healthy diet and exercise. She takes tramadol rarely for hip pain  2. Encounter for screening mammogram for breast cancer Schedule left Mammo with DEXA - MM DIAG BREAST TOMO UNI LEFT  3. Encounter for screening for osteoporosis - DG Bone Density  4. Coronary artery disease involving native coronary artery of native heart without angina pectoris No chest pain or SOB; remains very active in retirement. On ASA and statin.  Seeing Cardiology in follow up next month. - TSH  5. Gastroesophageal reflux disease, unspecified whether esophagitis present Symptoms well controlled on daily PPI No red flag signs such as weight loss, n/v, melena Will continue omeprazole. - CBC with Differential/Platelet  6. Hyperlipidemia, mild On statin therapy for medical management of CAD - Comprehensive metabolic panel - Lipid panel   Partially dictated using Dragon software. Any errors are unintentional.  Halina Maidens, MD [facility]  11/23/2020

## 2020-11-24 LAB — COMPREHENSIVE METABOLIC PANEL
ALT: 20 IU/L (ref 0–32)
AST: 27 IU/L (ref 0–40)
Albumin/Globulin Ratio: 1.8 (ref 1.2–2.2)
Albumin: 4.4 g/dL (ref 3.8–4.8)
Alkaline Phosphatase: 74 IU/L (ref 44–121)
BUN/Creatinine Ratio: 15 (ref 12–28)
BUN: 11 mg/dL (ref 8–27)
Bilirubin Total: 0.3 mg/dL (ref 0.0–1.2)
CO2: 25 mmol/L (ref 20–29)
Calcium: 9.5 mg/dL (ref 8.7–10.3)
Chloride: 99 mmol/L (ref 96–106)
Creatinine, Ser: 0.74 mg/dL (ref 0.57–1.00)
Globulin, Total: 2.5 g/dL (ref 1.5–4.5)
Glucose: 96 mg/dL (ref 65–99)
Potassium: 4 mmol/L (ref 3.5–5.2)
Sodium: 139 mmol/L (ref 134–144)
Total Protein: 6.9 g/dL (ref 6.0–8.5)
eGFR: 89 mL/min/{1.73_m2} (ref >59)

## 2020-11-24 LAB — CBC WITH DIFFERENTIAL/PLATELET
Basophils Absolute: 0.1 10*3/uL (ref 0.0–0.2)
Basos: 1 %
EOS (ABSOLUTE): 0.3 10*3/uL (ref 0.0–0.4)
Eos: 4 %
Hematocrit: 44.1 % (ref 34.0–46.6)
Hemoglobin: 14.6 g/dL (ref 11.1–15.9)
Immature Grans (Abs): 0 10*3/uL (ref 0.0–0.1)
Immature Granulocytes: 0 %
Lymphocytes Absolute: 1.5 10*3/uL (ref 0.7–3.1)
Lymphs: 22 %
MCH: 30.6 pg (ref 26.6–33.0)
MCHC: 33.1 g/dL (ref 31.5–35.7)
MCV: 93 fL (ref 79–97)
Monocytes Absolute: 1 10*3/uL — ABNORMAL HIGH (ref 0.1–0.9)
Monocytes: 14 %
Neutrophils Absolute: 4.2 10*3/uL (ref 1.4–7.0)
Neutrophils: 59 %
Platelets: 300 10*3/uL (ref 150–450)
RBC: 4.77 x10E6/uL (ref 3.77–5.28)
RDW: 13 % (ref 11.7–15.4)
WBC: 7 10*3/uL (ref 3.4–10.8)

## 2020-11-24 LAB — LIPID PANEL
Chol/HDL Ratio: 2.6 ratio (ref 0.0–4.4)
Cholesterol, Total: 154 mg/dL (ref 100–199)
HDL: 59 mg/dL (ref >39)
LDL Chol Calc (NIH): 69 mg/dL (ref 0–99)
Triglycerides: 153 mg/dL — ABNORMAL HIGH (ref 0–149)
VLDL Cholesterol Cal: 26 mg/dL (ref 5–40)

## 2020-11-24 LAB — TSH: TSH: 2.54 u[IU]/mL (ref 0.450–4.500)

## 2020-11-27 ENCOUNTER — Other Ambulatory Visit: Payer: Self-pay

## 2020-11-27 ENCOUNTER — Ambulatory Visit: Payer: Medicare Other | Admitting: Cardiovascular Disease

## 2020-11-27 ENCOUNTER — Encounter: Payer: Self-pay | Admitting: Cardiovascular Disease

## 2020-11-27 VITALS — BP 124/90 | HR 96 | Ht 66.0 in | Wt 176.2 lb

## 2020-11-27 DIAGNOSIS — E785 Hyperlipidemia, unspecified: Secondary | ICD-10-CM

## 2020-11-27 DIAGNOSIS — I2510 Atherosclerotic heart disease of native coronary artery without angina pectoris: Secondary | ICD-10-CM | POA: Diagnosis not present

## 2020-11-27 DIAGNOSIS — I10 Essential (primary) hypertension: Secondary | ICD-10-CM

## 2020-11-27 MED ORDER — EZETIMIBE 10 MG PO TABS: 10.0000 mg | ORAL_TABLET | Freq: Every day | ORAL | 3 refills | Status: DC

## 2020-11-27 MED ORDER — ROSUVASTATIN CALCIUM 20 MG PO TABS: 20.0000 mg | ORAL_TABLET | Freq: Every day | ORAL | 3 refills | Status: DC

## 2020-11-27 MED ORDER — HYDROCHLOROTHIAZIDE 25 MG PO TABS
25.0000 mg | ORAL_TABLET | Freq: Every day | ORAL | 3 refills | Status: DC
Start: 2020-11-27 — End: 2021-02-25

## 2020-11-27 NOTE — Progress Notes (Signed)
Cardiology Office Note   Date:  11/27/2020   ID:  [name], DOB [birth year], MRN [id number]  PCP:  Glean Hess, MD  Cardiologist:   Kathlyn Sacramento, MD   Chief Complaint  Patient presents with   Other    6 month f/u c/o palpitations and sob. Meds reviewed verbally with pt.       History of Present Illness: [name] is a 70 y.o. female who is here today for follow-up visit regarding mild to moderate coronary artery disease.   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  Carotid Doppler in March of 2021 showed no significant disease.  Cardiac CTA in June 2021 showed a calcium score of 274 which was in the 90th percentile for age and sex matched control.  There was evidence of moderate stenosis in the mid LAD and mild disease in the proximal LAD as well as ostial and distal right coronary artery.  None of the lesions were significant by FFR.  Echocardiogram showed normal LV systolic function and mild mitral regurgitation.  No evidence of pulmonary hypertension.   She has been treated medically.  Has been doing well with no chest pain.  She continues to have mild exertional dyspnea.  She is trying to do more exercise with water aerobics.  She does have mild bilateral leg edema.   Past Medical History:  Diagnosis Date   Arthritis    Breast cancer ([facility]) 02/2014   Cancer ([facility])    Vulvular   GERD (gastroesophageal reflux disease)    Hepatitis    Hep B in 1981   PONV (postoperative nausea and vomiting)    pt states scopalamine patch helped and zofran and phenergan helped    Past Surgical History:  Procedure Laterality Date   [facility] Right 123XX123   Silicone   BREAST BIOPSY Right    Positive   BREAST EXCISIONAL BIOPSY Left 02/17/2018   Removed fat necrosis    BREAST REDUCTION  SURGERY Left 01/18/2015   Procedure: MAMMARY REDUCTION  (BREAST);  Surgeon: Nicholaus Bloom, MD;  Location: [facility];  Service: Plastics;  Laterality: Left;   BREAST REDUCTION SURGERY Left 02/17/2018   Procedure: Left breast reduction with excision of fat necrosis-no liposuction needed;  Surgeon: Wallace Going, DO;  Location: [facility];  Service: Plastics;  Laterality: Left;   PLACEMENT OF BREAST IMPLANTS Right 02/17/2018   Procedure: PLACEMENT OF BREAST IMPLANTS;  Surgeon: Wallace Going, DO;  Location: [facility];  Service: Plastics;  Laterality: Right;   TISSUE EXPANDER PLACEMENT Right 01/18/2015   Procedure: Right  BREAST EXPANDER PLACEMENT WITH ALLODERM;  Surgeon: Nicholaus Bloom, MD;  Location: [place] [facility];  Service: Plastics;  Laterality: Right;  DR Tula Nakayama RM 2 PER BRENDA   TISSUE EXPANDER PLACEMENT Right 03/13/2015   Procedure: Right breast tissue expander removal and Sillicone implant placement.;  Surgeon: Nicholaus Bloom, MD;  Location: [facility];  Service: Plastics;  Laterality: Right;   TOTAL MASTECTOMY Right 02/2013   stage 1 ER/PR+   VULVECTOMY       Current Outpatient Medications  Medication Sig Dispense Refill   aspirin EC 81 MG tablet Take 1 tablet (81 mg total) by mouth daily. Swallow whole.     Calcium Carbonate-Vitamin D (CALCIUM + D PO) Take 1 tablet by mouth 2 (two)  times daily.      esomeprazole (NEXIUM) 20 MG capsule Take 1 capsule (20 mg total) by mouth daily at 12 noon. 30 capsule 5   ezetimibe (ZETIA) 10 MG tablet Take 1 tablet (10 mg total) by mouth daily. 90 tablet 3   fexofenadine (ALLEGRA) 180 MG tablet Take 180 mg by mouth daily as needed for allergies.      hydrochlorothiazide (MICROZIDE) 12.5 MG capsule Take 1 capsule (12.5 mg total) by mouth daily. 90 capsule 3   Magnesium 500 MG TABS Take 500 mg by mouth daily.      rosuvastatin (CRESTOR) 20 MG tablet Take 1 tablet (20 mg total) by mouth daily. 90 tablet 3   traMADol (ULTRAM) 50 MG  tablet Take 1 tablet (50 mg total) by mouth every 6 (six) hours as needed for up to 5 days. 20 tablet 0   triamcinolone (NASACORT) 55 MCG/ACT AERO nasal inhaler Place 2 sprays into the nose daily as needed (allergies).      Turmeric 500 MG CAPS Take by mouth. Taking 2 caps daily     No current facility-administered medications for this visit.    Allergies:   Penicillins and Sulfa antibiotics    Social History:  The patient  reports that she has never smoked. She has never used smokeless tobacco. She reports current alcohol use. She reports that she does not use drugs.   Family History:  The patient's family history includes Breast cancer in her cousin and paternal grandmother; Diabetes in her brother and father; Hypertension in her father and mother; Leukemia in her maternal grandmother; Stroke in her father; Thyroid disease in her mother.    ROS:  Please see the history of present illness.   Otherwise, review of systems are positive for none.   All other systems are reviewed and negative.    PHYSICAL EXAM: VS:  BP 124/90 (BP Location: Left Arm, Patient Position: Sitting, Cuff Size: Normal)   Pulse 96   Ht '5\' 6"'$  (1.676 m)   Wt 176 lb 4 oz (79.9 kg)   SpO2 98%   BMI 28.45 kg/m  , BMI Body mass index is 28.45 kg/m. GEN: Well nourished, well developed, in no acute distress  HEENT: normal  Neck: no JVD, carotid bruits, or masses Cardiac: RRR; no murmurs, rubs, or gallops, trace bilateral leg edema Respiratory:  clear to auscultation bilaterally, normal work of breathing GI: soft, nontender, nondistended, + BS MS: no deformity or atrophy  Skin: warm and dry, no rash Neuro:  Strength and sensation are intact Psych: euthymic mood, full affect   EKG:  EKG is ordered today. The ekg ordered today demonstrates normal sinus rhythm with nonspecific ST and T wave changes.  Recent Labs: 11/23/2020: ALT 20; BUN 11; Creatinine, Ser 0.74; Hemoglobin 14.6; Platelets 300; Potassium 4.0; Sodium  139; TSH 2.540    Lipid Panel    Component Value Date/Time   CHOL 154 11/23/2020 1041   TRIG 153 (H) 11/23/2020 1041   HDL 59 11/23/2020 1041   CHOLHDL 2.6 11/23/2020 1041   CHOLHDL 2.1 02/03/2020 0757   VLDL 17 02/03/2020 0757   LDLCALC 69 11/23/2020 1041      Wt Readings from Last 3 Encounters:  11/27/20 176 lb 4 oz (79.9 kg)  11/23/20 178 lb (80.7 kg)  05/22/20 173 lb (78.5 kg)       PAD Screen 08/11/2019  Previous PAD dx? No  Previous surgical procedure? No  Pain with walking? No  Feet/toe relief with  dangling? No  Painful, non-healing ulcers? No  Extremities discolored? No      ASSESSMENT AND PLAN:  1.  Coronary artery disease involving native coronary arteries without angina: CTA showed mild to moderate nonobstructive coronary artery disease with elevated calcium score.  Continue aggressive treatment of risk factors.  Continue low-dose aspirin.  2.  Hyperlipidemia: I reviewed her recent lipid profile which showed an LDL of 69 and HDL of 59.  Her previous LDL was 175 before treatment.  Continue both rosuvastatin and Zetia.  These were refilled today.   3.  Essential hypertension: Her blood pressure improved compared to before but still mildly elevated.  I increase hydrochlorothiazide to 25 mg once daily.  I reviewed her recent labs which showed normal renal function and electrolytes.    Disposition:   FU with me in 12 months.  Signed,  Kathlyn Sacramento, MD  11/27/2020 4:56 PM    [place]

## 2020-11-27 NOTE — Patient Instructions (Addendum)
Medication Instructions:   Increase Hydrochlorothiazide 25 mg daily.  *If you need a refill on your cardiac medications before your next appointment, please call your pharmacy*   Lab Work: NONE If you have labs (blood work) drawn today and your tests are completely normal, you will receive your results only by: [place] (if you have MyChart) OR A paper copy in the mail If you have any lab test that is abnormal or we need to change your treatment, we will call you to review the results.   Testing/Procedures: NONE   Follow-Up: At [facility], you and your health needs are our priority.  As part of our continuing mission to provide you with exceptional heart care, we have created designated Provider Care Teams.  These Care Teams include your primary Cardiologist (physician) and Advanced Practice Providers (APPs -  Physician Assistants and Nurse Practitioners) who all work together to provide you with the care you need, when you need it.  We recommend signing up for the patient portal called "MyChart".  Sign up information is provided on this After Visit Summary.  MyChart is used to connect with patients for Virtual Visits (Telemedicine).  Patients are able to view lab/test results, encounter notes, upcoming appointments, etc.  Non-urgent messages can be sent to your provider as well.   To learn more about what you can do with MyChart, go to [email or web address].    Your next appointment:   12 month(s)  The format for your next appointment:   In Person  Provider:   You may see Dr. Fletcher Anon or one of the following Advanced Practice Providers on your designated Care Team:   Murray Hodgkins, NP Christell Faith, PA-C Marrianne Mood, PA-C Cadence Kathlen Mody, [place]   Other Instructions

## 2020-12-18 ENCOUNTER — Ambulatory Visit
Admission: RE | Admit: 2020-12-18 | Discharge: 2020-12-18 | Disposition: A | Discharge status: Home / Self Care | Payer: Medicare Other | Source: Ambulatory Visit | Attending: Internal Medicine | Admitting: Internal Medicine

## 2020-12-18 ENCOUNTER — Other Ambulatory Visit: Payer: Self-pay

## 2020-12-18 DIAGNOSIS — Z8544 Personal history of malignant neoplasm of other female genital organs: Secondary | ICD-10-CM | POA: Diagnosis not present

## 2020-12-18 DIAGNOSIS — R922 Inconclusive mammogram: Secondary | ICD-10-CM | POA: Diagnosis not present

## 2020-12-18 DIAGNOSIS — Z13820 Encounter for screening for osteoporosis: Secondary | ICD-10-CM | POA: Diagnosis not present

## 2020-12-18 DIAGNOSIS — Z8541 Personal history of malignant neoplasm of cervix uteri: Secondary | ICD-10-CM | POA: Diagnosis not present

## 2020-12-18 DIAGNOSIS — Z1231 Encounter for screening mammogram for malignant neoplasm of breast: Secondary | ICD-10-CM | POA: Insufficient documentation

## 2020-12-18 DIAGNOSIS — N644 Mastodynia: Secondary | ICD-10-CM

## 2020-12-18 DIAGNOSIS — Z780 Asymptomatic menopausal state: Secondary | ICD-10-CM | POA: Diagnosis not present

## 2020-12-18 LAB — HM MAMMOGRAPHY

## 2020-12-25 ENCOUNTER — Ambulatory Visit: Payer: Medicare Other | Admitting: Cardiovascular Disease

## 2021-02-15 ENCOUNTER — Other Ambulatory Visit: Payer: Self-pay | Admitting: Internal Medicine

## 2021-02-16 NOTE — Telephone Encounter (Signed)
Requested Prescriptions  Pending Prescriptions Disp Refills  . esomeprazole (NEXIUM) 20 MG capsule [Pharmacy Med Name: ESOMEPRAZOLE MAG 20MG  DR  CAPS] 30 capsule 5    Sig: TAKE ONE CAPSULE BY MOUTH DAILY AT [place]     Gastroenterology: Proton Pump Inhibitors Passed - 02/15/2021  6:32 AM      Passed - Valid encounter within last 12 months    Recent Outpatient Visits          2 months ago Annual physical exam   [facility] Glean Hess, MD   1 year ago Annual physical exam   [facility] Glean Hess, MD   2 years ago Annual physical exam   [facility] Glean Hess, MD   3 years ago Annual physical exam   [facility] Glean Hess, MD   5 years ago Bursitis, hip, left   [facility] Glean Hess, MD      Future Appointments            In 9 months Army Melia Jesse Sans, MD [facility], [facility]

## 2021-02-24 ENCOUNTER — Other Ambulatory Visit: Payer: Self-pay | Admitting: Cardiovascular Disease

## 2021-04-19 ENCOUNTER — Telehealth: Payer: Self-pay | Admitting: Internal Medicine

## 2021-04-19 NOTE — Telephone Encounter (Signed)
Pharmacy updated.  KP

## 2021-04-19 NOTE — Telephone Encounter (Signed)
Patient called in to update her pharmacy info, system wouldn't allow me to automatically update. It is [facility] ,[address] , [place] [address] [phone]

## 2021-05-28 ENCOUNTER — Encounter: Payer: Self-pay | Admitting: Plastic Surgery

## 2021-05-28 ENCOUNTER — Ambulatory Visit: Payer: Medicare HMO | Admitting: Plastic Surgery

## 2021-05-28 ENCOUNTER — Other Ambulatory Visit: Payer: Self-pay

## 2021-05-28 DIAGNOSIS — N651 Disproportion of reconstructed breast: Secondary | ICD-10-CM

## 2021-05-28 DIAGNOSIS — Z9011 Acquired absence of right breast and nipple: Secondary | ICD-10-CM

## 2021-05-28 NOTE — Progress Notes (Signed)
? ?  Patient ID: [name], female    DOB: 1950-09-12, 71 y.o.   MRN: [id number] ? ? ?Chief Complaint  ?Patient presents with  ? Follow-up  ? Breast Problem  ? ? ?The patient is a 71 year old female here for her yearly follow-up after breast reconstruction.  In 2016 she underwent right breast surgery with expander placement.  She then had the expander removed and an implant placed.  She came to see me in 2019 for revision.  We did a capsulectomy and repositioning of the right breast implant.  We then did a left breast mastopexy reduction for symmetry.  She does not have a nipple areola reconstruction and is not interested in tattooing at this time.  She is feeling good and retired and very happy.  No lumps or bumps are noted. She has a Product manager smooth round high-profile 400 cc gel implant in place. ? ? ?Review of Systems  ?Constitutional: Negative.   ?HENT: Negative.    ?Eyes: Negative.   ?Respiratory: Negative.    ?Cardiovascular: Negative.   ?Gastrointestinal: Negative.   ?Endocrine: Negative.   ?Genitourinary: Negative.   ?Musculoskeletal: Negative.   ?Skin: Negative.   ? ?Past Medical History:  ?Diagnosis Date  ? Arthritis   ? Breast cancer ([place]) 02/2014  ? 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 GERD (gastroesophageal reflux disease)   ? Hepatitis   ? Hep B in 1981  ? PONV (postoperative nausea and vomiting)   ? pt states scopalamine patch helped and zofran and phenergan helped  ?  ?Past Surgical History:  ?Procedure Laterality Date  ? ABDOMINAL HYSTERECTOMY  1997  ? AUGMENTATION MAMMAPLASTY Right 02/2015  ? Silicone  ? BREAST BIOPSY Right   ? Positive  ? BREAST EXCISIONAL BIOPSY Left 02/17/2018  ? Removed fat necrosis   ? BREAST REDUCTION SURGERY Left 01/18/2015  ? Procedure: MAMMARY REDUCTION  (BREAST);  Surgeon: Nicholaus Bloom, MD;  Location: [place];  Service: Plastics;  Laterality: Left;  ? BREAST REDUCTION SURGERY Left 02/17/2018  ? Procedure: Left breast reduction with excision of fat necrosis-no  liposuction needed;  Surgeon: Wallace Going, DO;  Location: [facility];  Service: Plastics;  Laterality: Left;  ? PLACEMENT OF BREAST IMPLANTS Right 02/17/2018  ? Procedure: PLACEMENT OF BREAST IMPLANTS;  Surgeon: Wallace Going, DO;  Location: [facility];  Service: Plastics;  Laterality: Right;  ? TISSUE EXPANDER PLACEMENT Right 01/18/2015  ? Procedure: Right  BREAST EXPANDER PLACEMENT WITH ALLODERM;  Surgeon: Nicholaus Bloom, MD;  Location: [place];  Service: Plastics;  Laterality: Right;  DR Tula Nakayama RM 2 PER BRENDA  ? TISSUE EXPANDER PLACEMENT Right 03/13/2015  ? Procedure: Right breast tissue expander removal and Sillicone implant placement.;  Surgeon: Nicholaus Bloom, MD;  Location: [place];  Service: Plastics;  Laterality: Right;  ? TOTAL MASTECTOMY Right 02/2013  ? stage 1 ER/PR+  ? VULVECTOMY    ?  ? ? ?Current Outpatient Medications:  ?  aspirin EC 81 MG tablet, Take 1 tablet (81 mg total) by mouth daily. Swallow whole., Disp: , Rfl:  ?  Calcium Carbonate-Vitamin D (CALCIUM + D PO), Take 1 tablet by mouth 2 (two) times daily. , Disp: , Rfl:  ?  esomeprazole (NEXIUM) 20 MG capsule, TAKE ONE CAPSULE BY MOUTH DAILY AT NOON, Disp: 30 capsule, Rfl: 5 ?  fexofenadine (ALLEGRA) 180 MG tablet, Take 180 mg by mouth daily as needed for allergies. , Disp: , Rfl:  ?  hydrochlorothiazide (  HYDRODIURIL) 25 MG tablet, TAKE 1 TABLET(25 MG) BY MOUTH DAILY, Disp: 90 tablet, Rfl: 2 ?  Magnesium 500 MG TABS, Take 500 mg by mouth daily. , Disp: , Rfl:  ?  triamcinolone (NASACORT) 55 MCG/ACT AERO nasal inhaler, Place 2 sprays into the nose daily as needed (allergies). , Disp: , Rfl:  ?  Turmeric 500 MG CAPS, Take by mouth. Taking 2 caps daily, Disp: , Rfl:  ?  ezetimibe (ZETIA) 10 MG tablet, Take 1 tablet (10 mg total) by mouth daily., Disp: 90 tablet, Rfl: 3 ?  rosuvastatin (CRESTOR) 20 MG tablet, Take 1 tablet (20 mg total) by mouth daily., Disp: 90 tablet, Rfl: 3  ? ?Objective:  ? ?There were no vitals  filed for this visit. ? ?Physical Exam ?Constitutional:   ?   Appearance: Normal appearance.  ?HENT:  ?   Head: Normocephalic and atraumatic.  ?Cardiovascular:  ?   Rate and Rhythm: Normal rate.  ?   Pulses: Normal pulses.  ?Pulmonary:  ?   Effort: Pulmonary effort is normal.  ?Abdominal:  ?   General: There is no distension.  ?   Palpations: Abdomen is soft.  ?   Tenderness: There is no abdominal tenderness. There is no guarding.  ?Musculoskeletal:     ?   General: No swelling or deformity.  ?Skin: ?   General: Skin is warm.  ?   Capillary Refill: Capillary refill takes less than 2 seconds.  ?   Coloration: Skin is not jaundiced.  ?   Findings: No bruising.  ?Neurological:  ?   General: No focal deficit present.  ?   Mental Status: She is alert and oriented to person, place, and time.  ?Psychiatric:     ?   Mood and Affect: Mood normal.     ?   Behavior: Behavior normal.     ?   Thought Content: Thought content normal.  ? ? ?Assessment & Plan:  ?Breast asymmetry following reconstructive surgery ? ?Acquired absence of right breast ? ?We will plan for a ultrasound of the right breast.  She gets her mammograms regularly for the left breast.  They have been negative.  She is due either in September or October.  I would like to see her back in 1 year.  She knows to call with any questions or concerns.  I will get the ultrasound ordered. ? ?Pictures were obtained of the patient and placed in the chart with the patient's or guardian's permission. ? ? ?Loel Lofty Iman Orourke, DO ?

## 2021-06-10 ENCOUNTER — Ambulatory Visit (INDEPENDENT_AMBULATORY_CARE_PROVIDER_SITE_OTHER): Payer: Medicare HMO

## 2021-06-10 DIAGNOSIS — Z0000 Encounter for general adult medical examination without abnormal findings: Secondary | ICD-10-CM | POA: Diagnosis not present

## 2021-06-10 NOTE — Progress Notes (Signed)
? ?Subjective:  ? [name] is a 71 y.o. female who presents for an Initial Medicare Annual Wellness Visit. ? ?Virtual Visit via Telephone Note ? ?I connected with  [name] on 06/10/21 at  9:15 AM EDT by telephone and verified that I am speaking with the correct person using two identifiers. ? ?Location: ?Patient: home ?Provider: [facility] ?Persons participating in the virtual visit: patient/Nurse Health Advisor ?  ?I discussed the limitations, risks, security and privacy concerns of performing an evaluation and management service by telephone and the availability of in person appointments. The patient expressed understanding and agreed to proceed. ? ?Interactive audio and video telecommunications were attempted between this nurse and patient, however failed, due to patient having technical difficulties OR patient did not have access to video capability.  We continued and completed visit with audio only. ? ?Some vital signs may be absent or patient reported.  ? ?Clemetine Marker, LPN ? ? ?Review of Systems    ? ?Cardiac Risk Factors include: advanced age (>58mn, >>53women);hypertension;dyslipidemia ? ?   ?Objective:  ?  ?Today's Vitals  ? 06/10/21 0918  ?PainSc: 6   ? ?There is no height or weight on file to calculate BMI. ? ? ?  06/10/2021  ?  9:24 AM 04/01/2018  ? 10:26 AM 02/17/2018  ?  6:25 AM 07/22/2017  ? 10:52 AM 12/03/2016  ? 10:57 AM 10/03/2015  ?  9:32 AM 03/13/2015  ? 10:45 AM  ?Advanced Directives  ?Does Patient Have a Medical Advance Directive? No No No No No No No  ?Would patient like information on creating a medical advance directive? No - Patient declined No - Patient declined No - Patient declined  No - Patient declined No - patient declined information No - patient declined information  ? ? ?Current Medications (verified) ?Outpatient Encounter Medications as of 06/10/2021  ?Medication Sig  ? aspirin EC 81 MG tablet Take 1 tablet (81 mg total) by mouth daily. Swallow whole.  ? Calcium Carbonate-Vitamin  D (CALCIUM + D PO) Take 1 tablet by mouth 2 (two) times daily.   ? ELDERBERRY PO Take by mouth daily. gummies  ? esomeprazole (NEXIUM) 20 MG capsule TAKE ONE CAPSULE BY MOUTH DAILY AT NOON  ? fexofenadine (ALLEGRA) 180 MG tablet Take 180 mg by mouth daily as needed for allergies.   ? hydrochlorothiazide (HYDRODIURIL) 25 MG tablet TAKE 1 TABLET(25 MG) BY MOUTH DAILY  ? Magnesium 500 MG TABS Take 500 mg by mouth daily.   ? triamcinolone (NASACORT) 55 MCG/ACT AERO nasal inhaler Place 2 sprays into the nose daily as needed (allergies).   ? Turmeric 500 MG CAPS Take by mouth. Taking 2 caps daily  ? ezetimibe (ZETIA) 10 MG tablet Take 1 tablet (10 mg total) by mouth daily.  ? rosuvastatin (CRESTOR) 20 MG tablet Take 1 tablet (20 mg total) by mouth daily.  ? ?No facility-administered encounter medications on file as of 06/10/2021.  ? ? ?Allergies (verified) ?Penicillins and Sulfa antibiotics  ? ?History: ?Past Medical History:  ?Diagnosis Date  ? Arthritis   ? Breast cancer ([place] 02/2014  ? Cancer ([facility]   ? Vulvular  ? GERD (gastroesophageal reflux disease)   ? Hepatitis   ? Hep B in 1981  ? PONV (postoperative nausea and vomiting)   ? pt states scopalamine patch helped and zofran and phenergan helped  ? ?Past Surgical History:  ?Procedure Laterality Date  ? ABDOMINAL HYSTERECTOMY  1997  ? AUGMENTATION MAMMAPLASTY Right 02/2015  ?  Silicone  ? BREAST BIOPSY Right   ? Positive  ? BREAST EXCISIONAL BIOPSY Left 02/17/2018  ? Removed fat necrosis   ? BREAST REDUCTION SURGERY Left 01/18/2015  ? Procedure: MAMMARY REDUCTION  (BREAST);  Surgeon: Nicholaus Bloom, MD;  Location: [place];  Service: Plastics;  Laterality: Left;  ? BREAST REDUCTION SURGERY Left 02/17/2018  ? Procedure: Left breast reduction with excision of fat necrosis-no liposuction needed;  Surgeon: Wallace Going, DO;  Location: [facility];  Service: Plastics;  Laterality: Left;  ? PLACEMENT OF BREAST IMPLANTS Right 02/17/2018  ? Procedure: PLACEMENT OF  BREAST IMPLANTS;  Surgeon: Wallace Going, DO;  Location: [facility];  Service: Plastics;  Laterality: Right;  ? TISSUE EXPANDER PLACEMENT Right 01/18/2015  ? Procedure: Right  BREAST EXPANDER PLACEMENT WITH ALLODERM;  Surgeon: Nicholaus Bloom, MD;  Location: [place];  Service: Plastics;  Laterality: Right;  DR Tula Nakayama RM 2 PER BRENDA  ? TISSUE EXPANDER PLACEMENT Right 03/13/2015  ? Procedure: Right breast tissue expander removal and Sillicone implant placement.;  Surgeon: Nicholaus Bloom, MD;  Location: [place];  Service: Plastics;  Laterality: Right;  ? TOTAL MASTECTOMY Right 02/2013  ? stage 1 ER/PR+  ? VULVECTOMY    ? ?Family History  ?Problem Relation Age of Onset  ? Hypertension Father   ? Diabetes Father   ? Stroke Father   ? Diabetes Brother   ? Hypertension Mother   ? Thyroid disease Mother   ? Leukemia Maternal Grandmother   ? Breast cancer Paternal Grandmother   ? Breast cancer Cousin   ? ?Social History  ? ?Socioeconomic History  ? Marital status: Married  ?  Spouse name: Not on file  ? Number of children: 2  ? Years of education: Not on file  ? Highest education level: Not on file  ?Occupational History  ? Not on file  ?Tobacco Use  ? Smoking status: Never  ? Smokeless tobacco: Never  ?Vaping Use  ? Vaping Use: Never used  ?Substance and Sexual Activity  ? Alcohol use: Yes  ?  Comment: 3-5 glasses of wine weekly  ? Drug use: No  ? Sexual activity: Yes  ?Other Topics Concern  ? Not on file  ?Social History Narrative  ? Not on file  ? ?Social Determinants of Health  ? ?Financial Resource Strain: Low Risk   ? Difficulty of Paying Living Expenses: Not hard at all  ?Food Insecurity: No Food Insecurity  ? Worried About Charity fundraiser in the Last Year: Never true  ? Ran Out of Food in the Last Year: Never true  ?Transportation Needs: No Transportation Needs  ? Lack of Transportation (Medical): No  ? Lack of Transportation (Non-Medical): No  ?Physical Activity: Sufficiently Active  ? Days of  Exercise per Week: 4 days  ? Minutes of Exercise per Session: 50 min  ?Stress: No Stress Concern Present  ? Feeling of Stress : Not at all  ?Social Connections: Socially Integrated  ? Frequency of Communication with Friends and Family: More than three times a week  ? Frequency of Social Gatherings with Friends and Family: Three times a week  ? Attends Religious Services: More than 4 times per year  ? Active Member of Clubs or Organizations: Yes  ? Attends Archivist Meetings: More than 4 times per year  ? Marital Status: Married  ? ? ?Tobacco Counseling ?Counseling given: Not Answered ? ? ?Clinical Intake: ? ?Pre-visit preparation completed: Yes ? ?Pain :  0-10 ?Pain Score: 6  ?Pain Type: Chronic pain ?Pain Location: Generalized (arthritis) ?Pain Onset: More than a month ago ?Pain Frequency: Constant ? ?  ? ?Nutritional Risks: None ?Diabetes: No ? ?How often do you need to have someone help you when you read instructions, pamphlets, or other written materials from your doctor or pharmacy?: 1 - Never ? ? ? ?Interpreter Needed?: No ? ?Information entered by :: Clemetine Marker LPN ? ? ?Activities of Daily Living ? ?  06/10/2021  ?  9:27 AM 06/04/2021  ?  1:40 PM  ?In your present state of health, do you have any difficulty performing the following activities:  ?Hearing? 1 0  ?Vision? 0 0  ?Difficulty concentrating or making decisions? 0 0  ?Walking or climbing stairs? 0 0  ?Dressing or bathing? 0 0  ?Doing errands, shopping? 0 0  ?Preparing Food and eating ? N N  ?Using the Toilet? N N  ?In the past six months, have you accidently leaked urine? N N  ?Do you have problems with loss of bowel control? N N  ?Managing your Medications? N N  ?Managing your Finances? N N  ?Housekeeping or managing your Housekeeping? N N  ? ? ?Patient Care Team: ?Glean Hess, MD as PCP - General (Internal Medicine) ?Wellington Hampshire, MD as Consulting Physician (Cardiology) ?Brendolyn Patty, MD (Dermatology) ?Dillingham, Loel Lofty, DO as  Attending Physician (Plastic Surgery) ? ?Indicate any recent Medical Services you may have received from other than [facility] providers in the past year (date may be approximate). ? ?   ?Assessment:  ? Terie Purser

## 2021-06-10 NOTE — Patient Instructions (Signed)
[name] , ?Thank you for taking time to come for your Medicare Wellness Visit. I appreciate your ongoing commitment to your health goals. Please review the following plan we discussed and let me know if I can assist you in the future.  ? ?Screening recommendations/referrals: ?Colonoscopy: done 09/23/13. Repeat 09/2023 ?Mammogram: done 12/18/20 ?Bone Density: done 12/18/20 ?Recommended yearly ophthalmology/optometry visit for glaucoma screening and checkup ?Recommended yearly dental visit for hygiene and checkup ? ?Vaccinations: ?Influenza vaccine: done 11/23/20 ?Pneumococcal vaccine: done 11/22/19 ?Tdap vaccine: done 07/16/17 ?Shingles vaccine: done 01/24/19, 04/18/19   ?Covid-19:done 03/08/19, 03/29/19, 12/30/19, 01/02/21 ? ?Advanced directives: Advance directive discussed with you today. Even though you declined this today please call our office should you change your mind and we can give you the proper paperwork for you to fill out.  ? ?Conditions/risks identified: Keep up the great work! ? ?Next appointment: Follow up in one year for your annual wellness visit  ? ? ?Preventive Care 49 Years and Older, Female ?Preventive care refers to lifestyle choices and visits with your health care provider that can promote health and wellness. ?What does preventive care include? ?A yearly physical exam. This is also called an annual well check. ?Dental exams once or twice a year. ?Routine eye exams. Ask your health care provider how often you should have your eyes checked. ?Personal lifestyle choices, including: ?Daily care of your teeth and gums. ?Regular physical activity. ?Eating a healthy diet. ?Avoiding tobacco and drug use. ?Limiting alcohol use. ?Practicing safe sex. ?Taking low-dose aspirin every day. ?Taking vitamin and mineral supplements as recommended by your health care provider. ?What happens during an annual well check? ?The services and screenings done by your health care provider during your annual well check will  depend on your age, overall health, lifestyle risk factors, and family history of disease. ?Counseling  ?Your health care provider may ask you questions about your: ?Alcohol use. ?Tobacco use. ?Drug use. ?Emotional well-being. ?Home and relationship well-being. ?Sexual activity. ?Eating habits. ?History of falls. ?Memory and ability to understand (cognition). ?Work and work Statistician. ?Reproductive health. ?Screening  ?You may have the following tests or measurements: ?Height, weight, and BMI. ?Blood pressure. ?Lipid and cholesterol levels. These may be checked every 5 years, or more frequently if you are over 52 years old. ?Skin check. ?Lung cancer screening. You may have this screening every year starting at age 43 if you have a 30-pack-year history of smoking and currently smoke or have quit within the past 15 years. ?Fecal occult blood test (FOBT) of the stool. You may have this test every year starting at age 32. ?Flexible sigmoidoscopy or colonoscopy. You may have a sigmoidoscopy every 5 years or a colonoscopy every 10 years starting at age 27. ?Hepatitis C blood test. ?Hepatitis B blood test. ?Sexually transmitted disease (STD) testing. ?Diabetes screening. This is done by checking your blood sugar (glucose) after you have not eaten for a while (fasting). You may have this done every 1-3 years. ?Bone density scan. This is done to screen for osteoporosis. You may have this done starting at age 71. ?Mammogram. This may be done every 1-2 years. Talk to your health care provider about how often you should have regular mammograms. ?Talk with your health care provider about your test results, treatment options, and if necessary, the need for more tests. ?Vaccines  ?Your health care provider may recommend certain vaccines, such as: ?Influenza vaccine. This is recommended every year. ?Tetanus, diphtheria, and acellular pertussis (Tdap, Td) vaccine. You  may need a Td booster every 10 years. ?Zoster vaccine. You may  need this after age 70. ?Pneumococcal 13-valent conjugate (PCV13) vaccine. One dose is recommended after age 95. ?Pneumococcal polysaccharide (PPSV23) vaccine. One dose is recommended after age 71. ?Talk to your health care provider about which screenings and vaccines you need and how often you need them. ?This information is not intended to replace advice given to you by your health care provider. Make sure you discuss any questions you have with your health care provider. ?Document Released: 03/30/2015 Document Revised: 11/21/2015 Document Reviewed: 01/02/2015 ?Elsevier Interactive Patient Education ? 2017 [place]. ? ?Fall Prevention in the Home ?Falls can cause injuries. They can happen to people of all ages. There are many things you can do to make your home safe and to help prevent falls. ?What can I do on the outside of my home? ?Regularly fix the edges of walkways and driveways and fix any cracks. ?Remove anything that might make you trip as you walk through a door, such as a raised step or threshold. ?Trim any bushes or trees on the path to your home. ?Use bright outdoor lighting. ?Clear any walking paths of anything that might make someone trip, such as rocks or tools. ?Regularly check to see if handrails are loose or broken. Make sure that both sides of any steps have handrails. ?Any raised decks and porches should have guardrails on the edges. ?Have any leaves, snow, or ice cleared regularly. ?Use sand or salt on walking paths during winter. ?Clean up any spills in your garage right away. This includes oil or grease spills. ?What can I do in the bathroom? ?Use night lights. ?Install grab bars by the toilet and in the tub and shower. Do not use towel bars as grab bars. ?Use non-skid mats or decals in the tub or shower. ?If you need to sit down in the shower, use a plastic, non-slip stool. ?Keep the floor dry. Clean up any water that spills on the floor as soon as it happens. ?Remove soap buildup in  the tub or shower regularly. ?Attach bath mats securely with double-sided non-slip rug tape. ?Do not have throw rugs and other things on the floor that can make you trip. ?What can I do in the bedroom? ?Use night lights. ?Make sure that you have a light by your bed that is easy to reach. ?Do not use any sheets or blankets that are too big for your bed. They should not hang down onto the floor. ?Have a firm chair that has side arms. You can use this for support while you get dressed. ?Do not have throw rugs and other things on the floor that can make you trip. ?What can I do in the kitchen? ?Clean up any spills right away. ?Avoid walking on wet floors. ?Keep items that you use a lot in easy-to-reach places. ?If you need to reach something above you, use a strong step stool that has a grab bar. ?Keep electrical cords out of the way. ?Do not use floor polish or wax that makes floors slippery. If you must use wax, use non-skid floor wax. ?Do not have throw rugs and other things on the floor that can make you trip. ?What can I do with my stairs? ?Do not leave any items on the stairs. ?Make sure that there are handrails on both sides of the stairs and use them. Fix handrails that are broken or loose. Make sure that handrails are as long as the  stairways. ?Check any carpeting to make sure that it is firmly attached to the stairs. Fix any carpet that is loose or worn. ?Avoid having throw rugs at the top or bottom of the stairs. If you do have throw rugs, attach them to the floor with carpet tape. ?Make sure that you have a light switch at the top of the stairs and the bottom of the stairs. If you do not have them, ask someone to add them for you. ?What else can I do to help prevent falls? ?Wear shoes that: ?Do not have high heels. ?Have rubber bottoms. ?Are comfortable and fit you well. ?Are closed at the toe. Do not wear sandals. ?If you use a stepladder: ?Make sure that it is fully opened. Do not climb a closed  stepladder. ?Make sure that both sides of the stepladder are locked into place. ?Ask someone to hold it for you, if possible. ?Clearly mark and make sure that you can see: ?Any grab bars or handrails. ?First and l

## 2021-06-24 ENCOUNTER — Other Ambulatory Visit: Payer: Self-pay | Admitting: Internal Medicine

## 2021-06-27 ENCOUNTER — Telehealth: Payer: Self-pay | Admitting: Plastic Surgery

## 2021-06-27 DIAGNOSIS — Z853 Personal history of malignant neoplasm of breast: Secondary | ICD-10-CM

## 2021-06-27 NOTE — Telephone Encounter (Signed)
Radiology request change in order ?

## 2021-07-27 ENCOUNTER — Other Ambulatory Visit: Payer: Self-pay | Admitting: Internal Medicine

## 2021-07-30 ENCOUNTER — Telehealth: Payer: Self-pay | Admitting: Plastic Surgery

## 2021-07-30 DIAGNOSIS — Z853 Personal history of malignant neoplasm of breast: Secondary | ICD-10-CM

## 2021-07-30 DIAGNOSIS — N651 Disproportion of reconstructed breast: Secondary | ICD-10-CM

## 2021-07-30 NOTE — Telephone Encounter (Signed)
Requested Prescriptions  ?Pending Prescriptions Disp Refills  ?? esomeprazole (NEXIUM) 20 MG capsule [Pharmacy Med Name: ESOMEPRAZOLE MAG DR 20 MG CAP] 90 capsule 0  ?  Sig: TAKE 1 CAPSULE BY MOUTH DAILY AT NOON  ?  ? Gastroenterology: Proton Pump Inhibitors 2 Passed - 07/27/2021 12:56 PM  ?  ?  Passed - ALT in normal range and within 360 days  ?  ALT  ?Date Value Ref Range Status  ?11/23/2020 20 0 - 32 IU/L Final  ?   ?  ?  Passed - AST in normal range and within 360 days  ?  AST  ?Date Value Ref Range Status  ?11/23/2020 27 0 - 40 IU/L Final  ?   ?  ?  Passed - Valid encounter within last 12 months  ?  Recent Outpatient Visits   ?      ? 8 months ago Annual physical exam  ? [facility] Glean Hess, MD  ? 1 year ago Annual physical exam  ? [facility] Glean Hess, MD  ? 2 years ago Annual physical exam  ? [facility] Glean Hess, MD  ? 4 years ago Annual physical exam  ? [facility] Glean Hess, MD  ? 5 years ago Bursitis, hip, left  ? [facility] Glean Hess, MD  ?  ?  ?Future Appointments   ?        ? In 3 months Army Melia Jesse Sans, MD [facility], [place]  ? In 3 months Fletcher Anon, Mertie Clause, MD [facility]  ?  ? ?  ?  ?  ? ?

## 2021-07-30 NOTE — Telephone Encounter (Signed)
Clarification of order.

## 2021-09-05 ENCOUNTER — Telehealth: Payer: Self-pay

## 2021-09-05 ENCOUNTER — Other Ambulatory Visit: Payer: Self-pay | Admitting: Internal Medicine

## 2021-09-05 DIAGNOSIS — Z1231 Encounter for screening mammogram for malignant neoplasm of breast: Secondary | ICD-10-CM

## 2021-09-05 NOTE — Telephone Encounter (Signed)
[employer], Pre-Certification is not required for this imaging. Pt will call [facility] [place] to have the US done at the same time as her mammogram.

## 2021-09-30 ENCOUNTER — Ambulatory Visit: Payer: Self-pay | Admitting: *Deleted

## 2021-09-30 ENCOUNTER — Telehealth: Payer: Self-pay | Admitting: Internal Medicine

## 2021-09-30 NOTE — Telephone Encounter (Signed)
I returned pt's call.  She tested positive for Covid on Sat.  I have cough, stuffy nose, low grade fever and body aches.   It started with a cough on Friday then Sat. Morning I had fever.  I'm not coughing up anything.   Just the nagging dry cough.  I tested twice and it was positive.   My husband has Covid too, also tested positive.    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   I'm very familiar with the dry and symptoms.  I want to start Paxlovid and something for the cough.   I live an hour [place] a half away.    Would she be willing to just call in the Paxlovid  and something for the cough?  She knows me really well and I think she would be willing to do that.   If she requires a visit then a virtual would be fine, just let me know.  Use the pharmacy [facility] in [place].  I sent this information to Dr. Army Melia at [facility].  (No protocol questions used because of IT issues which are being worked on now, reason free texted).

## 2021-09-30 NOTE — Telephone Encounter (Signed)
Called [name] to schedule a virtual visit for testing positive for Covid. [name] was at the pharmacy picking up medication for her husband. [name] stated "I'm guessing Dr. Army Melia has not sent in the medication for me. I wanted to pick them them up at the same time". Told [name] she needed a virtual visit before any medications could be sent in. She stated " I will get better never mind I will just worry about my husband bye."   KP

## 2021-09-30 NOTE — Telephone Encounter (Signed)
Message from Sharene Skeans sent at 09/30/2021  8:25 AM EDT  Summary: covid and Paxlovid   Pt tested positive for covid on Saturday / symptoms of cough, stuffy nose, low grade fever and body aches started on Friday / pt asked if provider can send in [place] for her/ please advise           Call History   Type Contact Phone/Fax User  09/30/2021 08:24 AM EDT Phone (Incoming) Jonna, Dittrich R (Self) [phone] Lemmie Evens) Wynetta Emery,

## 2021-09-30 NOTE — Telephone Encounter (Signed)
Duplicate message  KP 

## 2021-09-30 NOTE — Telephone Encounter (Signed)
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 Cell # [phone] Pls see NT note.

## 2021-11-25 ENCOUNTER — Ambulatory Visit (INDEPENDENT_AMBULATORY_CARE_PROVIDER_SITE_OTHER): Payer: Medicare HMO | Admitting: Internal Medicine

## 2021-11-25 ENCOUNTER — Encounter: Payer: Self-pay | Admitting: Internal Medicine

## 2021-11-25 VITALS — BP 128/80 | HR 101 | Ht 66.0 in | Wt 172.8 lb

## 2021-11-25 DIAGNOSIS — Z853 Personal history of malignant neoplasm of breast: Secondary | ICD-10-CM | POA: Diagnosis not present

## 2021-11-25 DIAGNOSIS — Z23 Encounter for immunization: Secondary | ICD-10-CM | POA: Diagnosis not present

## 2021-11-25 DIAGNOSIS — E785 Hyperlipidemia, unspecified: Secondary | ICD-10-CM | POA: Diagnosis not present

## 2021-11-25 DIAGNOSIS — I2510 Atherosclerotic heart disease of native coronary artery without angina pectoris: Secondary | ICD-10-CM | POA: Diagnosis not present

## 2021-11-25 DIAGNOSIS — K219 Gastro-esophageal reflux disease without esophagitis: Secondary | ICD-10-CM | POA: Diagnosis not present

## 2021-11-25 DIAGNOSIS — R609 Edema, unspecified: Secondary | ICD-10-CM

## 2021-11-25 DIAGNOSIS — Z0000 Encounter for general adult medical examination without abnormal findings: Secondary | ICD-10-CM | POA: Diagnosis not present

## 2021-11-25 DIAGNOSIS — H6690 Otitis media, unspecified, unspecified ear: Secondary | ICD-10-CM

## 2021-11-25 MED ORDER — ESOMEPRAZOLE MAGNESIUM 20 MG PO CPDR: DELAYED_RELEASE_CAPSULE | ORAL | 3 refills | Status: DC

## 2021-11-25 MED ORDER — AZITHROMYCIN 250 MG PO TABS: ORAL_TABLET | ORAL | 0 refills | Status: AC

## 2021-11-25 NOTE — Patient Instructions (Signed)
Schedule a Dermatology skin survey.

## 2021-11-25 NOTE — Progress Notes (Signed)
Date:  11/25/2021   Name:  [name]   DOB:  1950/07/08   MRN:  [id number]   Chief Complaint: Annual Exam IO DIEUJUSTE is a 71 y.o. female who presents today for her Complete Annual Exam. She feels fairly well. She reports exercising. She reports she is sleeping poorly. Breast complaints - none.  Mammogram: 12/2020 DEXA: 12/2020 Pap smear: discontinued Colonoscopy: 09/2013   There are no preventive care reminders to display for this patient.   Immunization History  Administered Date(s) Administered   Fluad Quad(high Dose 65+) 11/23/2020, 11/25/2021   Influenza-Unspecified 12/18/2014, 11/26/2017, 01/24/2019   Moderna Covid-19 Vaccine Bivalent Booster 79yr & up 01/02/2021   PFIZER(Purple Top)SARS-COV-2 Vaccination 03/08/2019, 03/29/2019, 12/30/2019   Pneumococcal Conjugate-13 11/17/2018   Pneumococcal Polysaccharide-23 11/22/2019   Tdap 07/16/2017   Zoster Recombinat (Shingrix) 01/24/2019, 04/18/2019   Zoster, Live 03/19/2011    Gastroesophageal Reflux She complains of heartburn. She reports no abdominal pain, no chest pain, no coughing or no wheezing. This is a recurrent problem. The problem occurs occasionally. Pertinent negatives include no fatigue. She has tried a PPI for the symptoms.  Hyperlipidemia This is a chronic problem. The problem is controlled. Pertinent negatives include no chest pain or shortness of breath. Current antihyperlipidemic treatment includes statins and ezetimibe. There are no compliance problems.   CAD -  Dr. AFletcher Anon Cardiac CTA in June 2021 showed a calcium score of 274 which was in the 90th percentile for age and sex matched control.  There was evidence of moderate stenosis in the mid LAD and mild disease in the proximal LAD as well as ostial and distal right coronary artery.  None of the lesions were significant by FFR.  Echocardiogram showed normal LV systolic function and mild mitral regurgitation.  No evidence of pulmonary hypertension.   She  has been treated medically.  Lab Results  Component Value Date   NA 139 11/23/2020   K 4.0 11/23/2020   CO2 25 11/23/2020   GLUCOSE 96 11/23/2020   BUN 11 11/23/2020   CREATININE 0.74 11/23/2020   CALCIUM 9.5 11/23/2020   EGFR 89 11/23/2020   GFRNONAA >60 06/06/2020   Lab Results  Component Value Date   CHOL 154 11/23/2020   HDL 59 11/23/2020   LDLCALC 69 11/23/2020   TRIG 153 (H) 11/23/2020   CHOLHDL 2.6 11/23/2020   Lab Results  Component Value Date   TSH 2.540 11/23/2020   No results found for: "HGBA1C" Lab Results  Component Value Date   WBC 7.0 11/23/2020   HGB 14.6 11/23/2020   HCT 44.1 11/23/2020   MCV 93 11/23/2020   PLT 300 11/23/2020   Lab Results  Component Value Date   ALT 20 11/23/2020   AST 27 11/23/2020   ALKPHOS 74 11/23/2020   BILITOT 0.3 11/23/2020   No results found for: "25OHVITD2", "25OHVITD3", "VD25OH"   Review of Systems  Constitutional:  Negative for chills, fatigue and fever.  HENT:  Positive for dental problem (pain after dental procedure with swollen glands) and ear pain. Negative for congestion, hearing loss, tinnitus, trouble swallowing and voice change.   Eyes:  Negative for visual disturbance.  Respiratory:  Negative for cough, chest tightness, shortness of breath and wheezing.   Cardiovascular:  Negative for chest pain, palpitations and leg swelling.  Gastrointestinal:  Positive for heartburn. Negative for abdominal pain, constipation, diarrhea and vomiting.  Endocrine: Negative for polydipsia and polyuria.  Genitourinary:  Negative for dysuria, frequency, genital sores, vaginal bleeding  and vaginal discharge.  Musculoskeletal:  Negative for arthralgias, gait problem and joint swelling.  Skin:  Negative for color change and rash.  Neurological:  Negative for dizziness, tremors, light-headedness and headaches.  Hematological:  Negative for adenopathy. Does not bruise/bleed easily.  Psychiatric/Behavioral:  Negative for dysphoric  mood and sleep disturbance. The patient is not nervous/anxious.     Patient Active Problem List   Diagnosis Date Noted   Coronary artery disease involving native coronary artery of native heart without angina pectoris 11/01/2019   Gastroesophageal reflux disease 11/17/2018   Acquired absence of breast 02/09/2018   Breast asymmetry following reconstructive surgery 02/09/2018   Hot flashes 05/28/2016   Environmental and seasonal allergies 12/18/2014   History of carcinoma in situ of vulva 12/18/2014   Alopecia 12/18/2014   Dependent edema 12/18/2014   History of hepatitis B virus infection 12/18/2014   Hx of breast cancer 12/18/2014   Hyperlipidemia, mild 12/18/2014    Allergies  Allergen Reactions   Penicillins Rash    Has patient had a PCN reaction causing immediate rash, facial/tongue/throat swelling, SOB or lightheadedness with hypotension: No Has patient had a PCN reaction causing severe rash involving mucus membranes or skin necrosis: No Has patient had a PCN reaction that required hospitalization: No Has patient had a PCN reaction occurring within the last 10 years: No If all of the above answers are "NO", then may proceed with Cephalosporin use.'   Sulfa Antibiotics Rash    Past Surgical History:  Procedure Laterality Date   ABDOMINAL HYSTERECTOMY  1997   AUGMENTATION MAMMAPLASTY Right 62/8315   Silicone   BREAST BIOPSY Right    Positive   BREAST EXCISIONAL BIOPSY Left 02/17/2018   Removed fat necrosis    BREAST REDUCTION SURGERY Left 01/18/2015   Procedure: MAMMARY REDUCTION  (BREAST);  Surgeon: Nicholaus Bloom, MD;  Location: [facility];  Service: Plastics;  Laterality: Left;   BREAST REDUCTION SURGERY Left 02/17/2018   Procedure: Left breast reduction with excision of fat necrosis-no liposuction needed;  Surgeon: Wallace Going, DO;  Location: [facility];  Service: Plastics;  Laterality: Left;   PLACEMENT OF BREAST IMPLANTS Right 02/17/2018   Procedure:  PLACEMENT OF BREAST IMPLANTS;  Surgeon: Wallace Going, DO;  Location: [facility];  Service: Plastics;  Laterality: Right;   TISSUE EXPANDER PLACEMENT Right 01/18/2015   Procedure: Right  BREAST EXPANDER PLACEMENT WITH ALLODERM;  Surgeon: Nicholaus Bloom, MD;  Location: [place];  Service: Plastics;  Laterality: Right;  DR Tula Nakayama RM 2 PER BRENDA   TISSUE EXPANDER PLACEMENT Right 03/13/2015   Procedure: Right breast tissue expander removal and Sillicone implant placement.;  Surgeon: Nicholaus Bloom, MD;  Location: [place];  Service: Plastics;  Laterality: Right;   TOTAL MASTECTOMY Right 02/2013   stage 1 ER/PR+   VULVECTOMY      Social History   Tobacco Use   Smoking status: Never   Smokeless tobacco: Never  Vaping Use   Vaping Use: Never used  Substance Use Topics   Alcohol use: Yes    Comment: 3-5 glasses of wine weekly   Drug use: No     Medication list has been reviewed and updated.  Current Meds  Medication Sig   aspirin EC 81 MG tablet Take 1 tablet (81 mg total) by mouth daily. Swallow whole.   Calcium Carbonate-Vitamin D (CALCIUM + D PO) Take 1 tablet by mouth 2 (two) times daily.    ELDERBERRY PO Take by mouth daily. gummies  esomeprazole (NEXIUM) 20 MG capsule TAKE 1 CAPSULE BY MOUTH DAILY AT NOON   ezetimibe (ZETIA) 10 MG tablet Take 1 tablet (10 mg total) by mouth daily.   fexofenadine (ALLEGRA) 180 MG tablet Take 180 mg by mouth daily as needed for allergies.    hydrochlorothiazide (HYDRODIURIL) 25 MG tablet TAKE 1 TABLET(25 MG) BY MOUTH DAILY   Magnesium 500 MG TABS Take 500 mg by mouth daily.    rosuvastatin (CRESTOR) 20 MG tablet Take 1 tablet (20 mg total) by mouth daily.   triamcinolone (NASACORT) 55 MCG/ACT AERO nasal inhaler Place 2 sprays into the nose daily as needed (allergies).    Turmeric 500 MG CAPS Take by mouth. Taking 2 caps daily       11/25/2021   10:08 AM 11/23/2020   10:03 AM 11/22/2019    8:08 AM  GAD 7 : Generalized Anxiety  Score  Nervous, Anxious, on Edge 1 0 0  Control/stop worrying 1 0 0  Worry too much - different things 1 1 0  Trouble relaxing 1 1 0  Restless 1 0 0  Easily annoyed or irritable 0 0 0  Afraid - awful might happen 0 0 0  Total GAD 7 Score 5 2 0  Anxiety Difficulty Not difficult at all Not difficult at all Not difficult at all       11/25/2021   10:08 AM 06/10/2021    9:22 AM 11/23/2020   10:03 AM  Depression screen PHQ 2/9  Decreased Interest 0 0 0  Down, Depressed, Hopeless 0 0 0  PHQ - 2 Score 0 0 0  Altered sleeping 0  1  Tired, decreased energy 1  1  Change in appetite 1  0  Feeling bad or failure about yourself  0  0  Trouble concentrating 0  0  Moving slowly or fidgety/restless 0  0  Suicidal thoughts 0  0  PHQ-9 Score 2  2  Difficult doing work/chores Not difficult at all  Not difficult at all    BP Readings from Last 3 Encounters:  11/25/21 128/80  11/27/20 124/90  11/23/20 126/78    Physical Exam Vitals and nursing note reviewed.  Constitutional:      General: She is not in acute distress.    Appearance: She is well-developed.  HENT:     Head: Normocephalic and atraumatic.     Right Ear: Ear canal normal. Tympanic membrane is retracted. Tympanic membrane is not erythematous.     Left Ear: Tympanic membrane and ear canal normal. Tympanic membrane is not erythematous or retracted.     Ears:     Comments: Right TM dull    Nose:     Right Sinus: No maxillary sinus tenderness.     Left Sinus: No maxillary sinus tenderness.     Mouth/Throat:     Pharynx: No oropharyngeal exudate or posterior oropharyngeal erythema.  Eyes:     General: No scleral icterus.       Right eye: No discharge.        Left eye: No discharge.     Conjunctiva/sclera: Conjunctivae normal.  Neck:     Thyroid: No thyromegaly.     Vascular: No carotid bruit.  Cardiovascular:     Rate and Rhythm: Normal rate and regular rhythm.     Pulses: Normal pulses.     Heart sounds: Normal heart  sounds.  Pulmonary:     Effort: Pulmonary effort is normal. No respiratory distress.  Breath sounds: No wheezing.  Chest:  Breasts:    Right: No mass.     Left: No mass.     Comments: Right mastectomy scars healed Left breast reduction scars healed Abdominal:     General: Bowel sounds are normal.     Palpations: Abdomen is soft.     Tenderness: There is no abdominal tenderness.  Musculoskeletal:     Cervical back: Normal range of motion. No erythema.     Right lower leg: No edema.     Left lower leg: No edema.  Lymphadenopathy:     Cervical: No cervical adenopathy.  Skin:    General: Skin is warm and dry.     Capillary Refill: Capillary refill takes less than 2 seconds.     Findings: No rash.  Neurological:     General: No focal deficit present.     Mental Status: She is alert and oriented to person, place, and time.     Cranial Nerves: No cranial nerve deficit.     Sensory: No sensory deficit.     Deep Tendon Reflexes: Reflexes are normal and symmetric.  Psychiatric:        Attention and Perception: Attention normal.        Mood and Affect: Mood normal.     Wt Readings from Last 3 Encounters:  11/25/21 172 lb 12.8 oz (78.4 kg)  11/27/20 176 lb 4 oz (79.9 kg)  11/23/20 178 lb (80.7 kg)    BP 128/80   Pulse (!) 101   Ht 5' 6"  (1.676 m)   Wt 172 lb 12.8 oz (78.4 kg)   SpO2 94%   BMI 27.89 kg/m   Assessment and Plan: 1. Annual physical exam Normal exam. Continue exercise as tolerated, healthy diet Up to date on screenings and immunizations. Recommend Dermatology consult  2. Coronary artery disease involving native coronary artery of native heart without angina pectoris Still having shortness of breath with exertion. Seeing cardiology tomorrow to discuss further treatment or evaluation Continue ASA and statin - Lipid panel  3. Gastroesophageal reflux disease, unspecified whether esophagitis present Symptoms well controlled on daily PPI No red flag signs  such as weight loss, n/v, melena Will continue Nexium. - CBC with Differential/Platelet - esomeprazole (NEXIUM) 20 MG capsule; TAKE 1 CAPSULE BY MOUTH DAILY AT NOON  Dispense: 90 capsule; Refill: 3  4. Dependent edema And mild increase in BP Continue HCTZ and cardiology management - Comprehensive metabolic panel - TSH  5. Hyperlipidemia, mild On zetia and statin; will advise on dose change - Comprehensive metabolic panel - Lipid panel  6. Need for immunization against influenza - Flu Vaccine QUAD High Dose(Fluad)  7. Hx of breast cancer Due for screening next month - MM 3D SCREEN BREAST BILATERAL  8. Subacute otitis media, unspecified otitis media type Continue Flonase, resume sudafed - azithromycin (ZITHROMAX Z-PAK) 250 MG tablet; UAD  Dispense: 6 each; Refill: 0   Partially dictated using Editor, commissioning. Any errors are unintentional.  Halina Maidens, MD [facility]  11/25/2021

## 2021-11-26 ENCOUNTER — Ambulatory Visit: Payer: Medicare HMO | Attending: Cardiovascular Disease | Admitting: Cardiovascular Disease

## 2021-11-26 ENCOUNTER — Encounter: Payer: Self-pay | Admitting: Cardiovascular Disease

## 2021-11-26 VITALS — BP 132/67 | HR 98 | Ht 66.0 in | Wt 173.8 lb

## 2021-11-26 DIAGNOSIS — I10 Essential (primary) hypertension: Secondary | ICD-10-CM

## 2021-11-26 DIAGNOSIS — I2510 Atherosclerotic heart disease of native coronary artery without angina pectoris: Secondary | ICD-10-CM | POA: Diagnosis not present

## 2021-11-26 DIAGNOSIS — E782 Mixed hyperlipidemia: Secondary | ICD-10-CM

## 2021-11-26 LAB — CBC WITH DIFFERENTIAL/PLATELET
Basophils Absolute: 0 10*3/uL (ref 0.0–0.2)
Basos: 1 %
EOS (ABSOLUTE): 0.2 10*3/uL (ref 0.0–0.4)
Eos: 2 %
Hematocrit: 41.8 % (ref 34.0–46.6)
Hemoglobin: 13.7 g/dL (ref 11.1–15.9)
Immature Grans (Abs): 0 10*3/uL (ref 0.0–0.1)
Immature Granulocytes: 0 %
Lymphocytes Absolute: 1.7 10*3/uL (ref 0.7–3.1)
Lymphs: 22 %
MCH: 29.7 pg (ref 26.6–33.0)
MCHC: 32.8 g/dL (ref 31.5–35.7)
MCV: 91 fL (ref 79–97)
Monocytes Absolute: 0.9 10*3/uL (ref 0.1–0.9)
Monocytes: 13 %
Neutrophils Absolute: 4.7 10*3/uL (ref 1.4–7.0)
Neutrophils: 62 %
Platelets: 324 10*3/uL (ref 150–450)
RBC: 4.62 x10E6/uL (ref 3.77–5.28)
RDW: 13.4 % (ref 11.7–15.4)
WBC: 7.5 10*3/uL (ref 3.4–10.8)

## 2021-11-26 LAB — COMPREHENSIVE METABOLIC PANEL
ALT: 18 IU/L (ref 0–32)
AST: 25 IU/L (ref 0–40)
Albumin/Globulin Ratio: 1.5 (ref 1.2–2.2)
Albumin: 4.3 g/dL (ref 3.9–4.9)
Alkaline Phosphatase: 72 IU/L (ref 44–121)
BUN/Creatinine Ratio: 18 (ref 12–28)
BUN: 14 mg/dL (ref 8–27)
Bilirubin Total: 0.3 mg/dL (ref 0.0–1.2)
CO2: 27 mmol/L (ref 20–29)
Calcium: 9.4 mg/dL (ref 8.7–10.3)
Chloride: 97 mmol/L (ref 96–106)
Creatinine, Ser: 0.78 mg/dL (ref 0.57–1.00)
Globulin, Total: 2.8 g/dL (ref 1.5–4.5)
Glucose: 102 mg/dL — ABNORMAL HIGH (ref 70–99)
Potassium: 3.6 mmol/L (ref 3.5–5.2)
Sodium: 140 mmol/L (ref 134–144)
Total Protein: 7.1 g/dL (ref 6.0–8.5)
eGFR: 83 mL/min/{1.73_m2} (ref >59)

## 2021-11-26 LAB — LIPID PANEL
Chol/HDL Ratio: 3 ratio (ref 0.0–4.4)
Cholesterol, Total: 169 mg/dL (ref 100–199)
HDL: 57 mg/dL (ref >39)
LDL Chol Calc (NIH): 79 mg/dL (ref 0–99)
Triglycerides: 199 mg/dL — ABNORMAL HIGH (ref 0–149)
VLDL Cholesterol Cal: 33 mg/dL (ref 5–40)

## 2021-11-26 LAB — TSH: TSH: 3.19 u[IU]/mL (ref 0.450–4.500)

## 2021-11-26 MED ORDER — CARVEDILOL 6.25 MG PO TABS: 6.2500 mg | ORAL_TABLET | Freq: Two times a day (BID) | ORAL | 3 refills | Status: DC

## 2021-11-26 MED ORDER — HYDROCHLOROTHIAZIDE 25 MG PO TABS: ORAL_TABLET | ORAL | 3 refills | Status: DC

## 2021-11-26 MED ORDER — EZETIMIBE 10 MG PO TABS: 10.0000 mg | ORAL_TABLET | Freq: Every day | ORAL | 3 refills | Status: DC

## 2021-11-26 MED ORDER — ROSUVASTATIN CALCIUM 20 MG PO TABS: 20.0000 mg | ORAL_TABLET | Freq: Every day | ORAL | 3 refills | Status: DC

## 2021-11-26 NOTE — Patient Instructions (Signed)
Medication Instructions:  Start Carvedilol 6.25 mg twice a day   *If you need a refill on your cardiac medications before your next appointment, please call your pharmacy*   Lab Work: None ordered   If you have labs (blood work) drawn today and your tests are completely normal, you will receive your results only by: [place] (if you have MyChart) OR A paper copy in the mail If you have any lab test that is abnormal or we need to change your treatment, we will call you to review the results.   Testing/Procedures: None ordered    Follow-Up: At [facility], you and your health needs are our priority.  As part of our continuing mission to provide you with exceptional heart care, we have created designated Provider Care Teams.  These Care Teams include your primary Cardiologist (physician) and Advanced Practice Providers (APPs -  Physician Assistants and Nurse Practitioners) who all work together to provide you with the care you need, when you need it.  We recommend signing up for the patient portal called "MyChart".  Sign up information is provided on this After Visit Summary.  MyChart is used to connect with patients for Virtual Visits (Telemedicine).  Patients are able to view lab/test results, encounter notes, upcoming appointments, etc.  Non-urgent messages can be sent to your provider as well.   To learn more about what you can do with MyChart, go to [email or web address].    Your next appointment:   12 month(s)  The format for your next appointment:   In Person  Provider:   You may see Dr. Kathlyn Sacramento or one of the following Advanced Practice Providers on your designated Care Team:   Murray Hodgkins, NP Christell Faith, PA-C Cadence Kathlen Mody, PA-C Gerrie Nordmann, NP    Other Instructions   Important Information About Sugar

## 2021-11-26 NOTE — Progress Notes (Signed)
Cardiology Office Note   Date:  11/26/2021   ID:  [name], DOB 08-May-1950, MRN [id number]  PCP:  Glean Hess, MD  Cardiologist:   Kathlyn Sacramento, MD   No chief complaint on file.      History of Present Illness: [name] is a 71 y.o. female who is here today for follow-up visit regarding mild to moderate coronary artery disease.   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  Carotid Doppler in March of 2021 showed no significant disease.  Cardiac CTA in June 2021 showed a calcium score of 274 which was in the 90th percentile for age and sex matched control.  There was evidence of moderate stenosis in the mid LAD and mild disease in the proximal LAD as well as ostial and distal right coronary artery.  None of the lesions were significant by FFR.  Echocardiogram showed normal LV systolic function and mild mitral regurgitation.  No evidence of pulmonary hypertension.   She has been treated medically.  Has been doing well with no chest pain.  She continues to have mild exertional dyspnea.  She is trying to do more exercise with water aerobics.  She does have mild bilateral leg edema.  She has been having elevated blood pressure at home and thus she has been taking an extra 12.5 mg once daily of hydrochlorothiazide.  In addition, she reports some palpitations and tachycardia at night.   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;  Service: Plastics;  Laterality: Left;   BREAST REDUCTION SURGERY Left 02/17/2018   Procedure: Left breast reduction with excision of fat necrosis-no liposuction needed;  Surgeon: Wallace Going, DO;  Location: [facility];  Service: Plastics;  Laterality: Left;   PLACEMENT OF BREAST IMPLANTS Right 02/17/2018   Procedure: PLACEMENT OF BREAST IMPLANTS;  Surgeon: Wallace Going, DO;  Location: [facility];  Service: Plastics;  Laterality: Right;   TISSUE EXPANDER PLACEMENT Right 01/18/2015   Procedure: Right  BREAST EXPANDER PLACEMENT WITH ALLODERM;  Surgeon: Nicholaus Bloom, MD;  Location: [place];  Service: Plastics;  Laterality: Right;  DR Tula Nakayama RM 2 PER BRENDA   TISSUE EXPANDER PLACEMENT Right 03/13/2015   Procedure: Right breast tissue expander removal and Sillicone implant placement.;  Surgeon: Nicholaus Bloom, MD;  Location: [place];  Service: Plastics;  Laterality: Right;   TOTAL MASTECTOMY Right 02/2013   stage 1 ER/PR+   VULVECTOMY       Current Outpatient Medications  Medication Sig Dispense Refill   aspirin EC 81 MG tablet Take 1 tablet (81 mg total) by mouth daily. Swallow whole.  azithromycin (ZITHROMAX Z-PAK) 250 MG tablet UAD 6 each 0   Calcium Carbonate-Vitamin D (CALCIUM + D PO) Take 1 tablet by mouth 2 (two) times daily.      carvedilol (COREG) 6.25 MG tablet Take 1 tablet (6.25 mg total) by mouth 2 (two) times daily. 180 tablet 3   ELDERBERRY PO Take by mouth daily. gummies     esomeprazole (NEXIUM) 20 MG capsule TAKE 1 CAPSULE BY MOUTH DAILY AT NOON 90 capsule 3   fexofenadine (ALLEGRA) 180 MG tablet Take 180 mg by mouth daily as needed for allergies.      Magnesium 500 MG TABS Take 500 mg by mouth daily.       triamcinolone (NASACORT) 55 MCG/ACT AERO nasal inhaler Place 2 sprays into the nose daily as needed (allergies).      Turmeric 500 MG CAPS Take by mouth. Taking 2 caps daily     ezetimibe (ZETIA) 10 MG tablet Take 1 tablet (10 mg total) by mouth daily. 90 tablet 3   hydrochlorothiazide (HYDRODIURIL) 25 MG tablet TAKE 1 TABLET(25 MG) BY MOUTH DAILY 90 tablet 3   rosuvastatin (CRESTOR) 20 MG tablet Take 1 tablet (20 mg total) by mouth daily. 90 tablet 3   No current facility-administered medications for this visit.    Allergies:   Penicillins and Sulfa antibiotics    Social History:  The patient  reports that she has never smoked. She has never used smokeless tobacco. She reports current alcohol use. She reports that she does not use drugs.   Family History:  The patient's family history includes Breast cancer in her cousin and paternal grandmother; Diabetes in her brother and father; Hypertension in her father and mother; Leukemia in her maternal grandmother; Stroke in her father; Thyroid disease in her mother.    ROS:  Please see the history of present illness.   Otherwise, review of systems are positive for none.   All other systems are reviewed and negative.    PHYSICAL EXAM: VS:  BP 132/67   Pulse 98   Ht '5\' 6"'$  (1.676 m)   Wt 173 lb 12.8 oz (78.8 kg)   SpO2 97%   BMI 28.05 kg/m  , BMI Body mass index is 28.05 kg/m. GEN: Well nourished, well developed, in no acute distress  HEENT: normal  Neck: no JVD, carotid bruits, or masses Cardiac: RRR; no murmurs, rubs, or gallops, trace bilateral leg edema Respiratory:  clear to auscultation bilaterally, normal work of breathing GI: soft, nontender, nondistended, + BS MS: no deformity or atrophy  Skin: warm and dry, no rash Neuro:  Strength and sensation are intact Psych: euthymic mood, full affect   EKG:  EKG is ordered today. The ekg ordered today demonstrates normal sinus rhythm with nonspecific T wave changes.  Heart rate is 87  bpm.  Recent Labs: 11/25/2021: ALT 18; BUN 14; Creatinine, Ser 0.78; Hemoglobin 13.7; Platelets 324; Potassium 3.6; Sodium 140; TSH 3.190    Lipid Panel    Component Value Date/Time   CHOL 169 11/25/2021 1145   TRIG 199 (H) 11/25/2021 1145   HDL 57 11/25/2021 1145   CHOLHDL 3.0 11/25/2021 1145   CHOLHDL 2.1 02/03/2020 0757   VLDL 17 02/03/2020 0757   LDLCALC 79 11/25/2021 1145      Wt Readings from Last 3 Encounters:  11/26/21 173 lb 12.8 oz (78.8 kg)  11/25/21 172 lb 12.8 oz (78.4 kg)  11/27/20 176 lb 4 oz (79.9 kg)  08/11/2019    2:27 PM  PAD Screen  Previous PAD dx? No  Previous surgical procedure? No  Pain with walking? No  Feet/toe relief with dangling? No  Painful, non-healing ulcers? No  Extremities discolored? No      ASSESSMENT AND PLAN:  1.  Coronary artery disease involving native coronary arteries without angina: CTA showed mild to moderate nonobstructive coronary artery disease with elevated calcium score.  Continue aggressive treatment of risk factors.  Continue low-dose aspirin.  2.  Hyperlipidemia: I reviewed her labs that were done yesterday.  Lipid profile showed an LDL of 79 and triglyceride of 199.  Continue treatment with rosuvastatin and ezetimibe.  Both of these were refilled today.  3.  Essential hypertension: Blood pressure has been mildly elevated in spite of hydrochlorothiazide.  Given her complaints of palpitations and tachycardia at night, I elected to add carvedilol 6.25 mg twice daily.    Disposition:   FU with me in 12 months.  Signed,  Kathlyn Sacramento, MD  11/26/2021 3:53 PM    [place]

## 2021-12-12 ENCOUNTER — Other Ambulatory Visit: Payer: Self-pay | Admitting: Internal Medicine

## 2021-12-12 ENCOUNTER — Ambulatory Visit: Payer: Self-pay

## 2021-12-12 ENCOUNTER — Encounter: Payer: Self-pay | Admitting: Internal Medicine

## 2021-12-12 ENCOUNTER — Other Ambulatory Visit: Payer: Self-pay

## 2021-12-12 DIAGNOSIS — K5792 Diverticulitis of intestine, part unspecified, without perforation or abscess without bleeding: Secondary | ICD-10-CM

## 2021-12-12 DIAGNOSIS — K644 Residual hemorrhoidal skin tags: Secondary | ICD-10-CM

## 2021-12-12 DIAGNOSIS — K921 Melena: Secondary | ICD-10-CM

## 2021-12-12 MED ORDER — HYDROCORTISONE ACETATE 25 MG RE SUPP: 25.0000 mg | Freq: Two times a day (BID) | RECTAL | 0 refills | Status: DC

## 2021-12-12 MED ORDER — HYDROCORTISONE (PERIANAL) 2.5 % EX CREA: 1.0000 | TOPICAL_CREAM | Freq: Two times a day (BID) | CUTANEOUS | 2 refills | Status: DC

## 2021-12-12 NOTE — Telephone Encounter (Signed)
Spoke with pt. She stated she has a history of external hemorrhoids and diverticulitis bleed. Per Dr Army Melia - sent in Anusol suppository to use twice daily. If happens again, Dr Army Melia wants to see her. Referred to GI even though pt knows it may takes months to gt in to see them for appt.  - [name]

## 2021-12-12 NOTE — Telephone Encounter (Signed)
     Chief Complaint: Rectal bleeding, Request referral to Dr. Allen Norris, GI. Symptoms: Bleeding, not every time, but today Frequency: 2 days ago Pertinent Negatives: Patient denies Pain Disposition: '[]'$ ED /'[]'$ Urgent Care (no appt availability in office) / '[]'$ Appointment(In office/virtual)/ '[]'$  [facility]/ '[]'$ Home Care/ '[]'$ Refused Recommended Disposition /'[]'$ [facility]/ '[x]'$  Follow-up with PCP Additional Notes:   Answer Assessment - Initial Assessment Questions 1. APPEARANCE of BLOOD: "What color is it?" "Is it passed separately, on the surface of the stool, or mixed in with the stool?"      Bright red, toilet and div 2. AMOUNT: "How much blood was passed?"      Large 3. FREQUENCY: "How many times has blood been passed with the stools?"      Comes and  4. ONSET: "When was the blood first seen in the stools?" (Days or weeks)      2 days ago 5. DIARRHEA: "Is there also some diarrhea?" If Yes, ask: "How many diarrhea stools in the past 24 hours?"      No 6. CONSTIPATION: "Do you have constipation?" If Yes, ask: "How bad is it?"     No 7. RECURRENT SYMPTOMS: "Have you had blood in your stools before?" If Yes, ask: "When was the last time?" and "What happened that time?"      Yes 8. BLOOD THINNERS: "Do you take any blood thinners?" (e.g., Coumadin/warfarin, Pradaxa/dabigatran, aspirin)     ASA 9. OTHER SYMPTOMS: "Do you have any other symptoms?"  (e.g., abdomen pain, vomiting, dizziness, fever)     No 10. PREGNANCY: "Is there any chance you are pregnant?" "When was your last menstrual period?"       No  Protocols used: Rectal Bleeding-A-AH

## 2021-12-25 ENCOUNTER — Ambulatory Visit
Admission: RE | Admit: 2021-12-25 | Discharge: 2021-12-25 | Disposition: A | Discharge status: Home / Self Care | Payer: Medicare HMO | Source: Ambulatory Visit | Attending: Internal Medicine | Admitting: Internal Medicine

## 2021-12-25 DIAGNOSIS — Z1231 Encounter for screening mammogram for malignant neoplasm of breast: Secondary | ICD-10-CM | POA: Diagnosis not present

## 2022-01-13 ENCOUNTER — Encounter (INDEPENDENT_AMBULATORY_CARE_PROVIDER_SITE_OTHER): Payer: Self-pay

## 2022-03-20 ENCOUNTER — Telehealth: Payer: Self-pay | Admitting: Plastic Surgery

## 2022-03-20 DIAGNOSIS — Z853 Personal history of malignant neoplasm of breast: Secondary | ICD-10-CM

## 2022-03-20 DIAGNOSIS — Z9011 Acquired absence of right breast and nipple: Secondary | ICD-10-CM

## 2022-03-20 NOTE — Telephone Encounter (Signed)
Pt need order for the (R) breast ultrasound that was to be scheduled on same day as her next appt which is 07/08/22 at 3pm.  Breast imaging is saying they do not have an order yet.

## 2022-03-20 NOTE — Addendum Note (Signed)
Addended byRoetta Sessions on: 03/20/2022 02:41 PM   Modules accepted: Orders

## 2022-03-24 ENCOUNTER — Telehealth: Payer: Self-pay | Admitting: Internal Medicine

## 2022-03-24 NOTE — Telephone Encounter (Signed)
Pt needs to be seen before a referral can be placed.  KP

## 2022-03-24 NOTE — Telephone Encounter (Signed)
Copied from [place] [phone]. Topic: Referral - Request for Referral >> Mar 24, 2022  9:00 AM Cyndi Bender wrote: Has patient seen PCP for this complaint? Yes.   *If NO, is insurance requiring patient see PCP for this issue before PCP can refer them? Referral for which specialty: Ortho  Preferred provider/office: [facility] in [place]  Reason for referral: right hip pain

## 2022-03-26 ENCOUNTER — Ambulatory Visit (INDEPENDENT_AMBULATORY_CARE_PROVIDER_SITE_OTHER): Payer: Medicare HMO | Admitting: Internal Medicine

## 2022-03-26 ENCOUNTER — Encounter: Payer: Self-pay | Admitting: Internal Medicine

## 2022-03-26 VITALS — BP 118/78 | HR 86 | Ht 66.0 in | Wt 169.0 lb

## 2022-03-26 DIAGNOSIS — M25551 Pain in right hip: Secondary | ICD-10-CM | POA: Diagnosis not present

## 2022-03-26 DIAGNOSIS — I2510 Atherosclerotic heart disease of native coronary artery without angina pectoris: Secondary | ICD-10-CM

## 2022-03-26 DIAGNOSIS — I10 Essential (primary) hypertension: Secondary | ICD-10-CM

## 2022-03-26 MED ORDER — PREDNISONE 10 MG PO TABS: ORAL_TABLET | ORAL | 0 refills | Status: AC

## 2022-03-26 MED ORDER — TRAMADOL HCL 50 MG PO TABS
50.0000 mg | ORAL_TABLET | Freq: Three times a day (TID) | ORAL | 0 refills | Status: DC | PRN
Start: 2022-03-26 — End: 2022-12-26

## 2022-03-26 NOTE — Assessment & Plan Note (Addendum)
Doing well without chest pain. Followed by cardiology on Crestor, Zetia Coreg added in September for increased BP

## 2022-03-26 NOTE — Assessment & Plan Note (Signed)
Clinically stable exam with well controlled BP on Coreg and HCTZ Tolerating medications without side effects at this time. Pt to continue current regimen and low sodium diet; benefits of regular exercise as able discussed.

## 2022-03-26 NOTE — Progress Notes (Signed)
Date:  03/26/2022   Name:  [name]   DOB:  1950/12/29   MRN:  [id number]   Chief Complaint: Hip Pain, Hypertension, and Gastroesophageal Reflux  Hip Pain  Incident onset: X2-3 months. There was no injury mechanism. The pain is present in the right hip. The quality of the pain is described as aching. The pain is at a severity of 4/10. The pain is mild. The pain has been Constant since onset. Associated symptoms include numbness. Associated symptoms comments: In right little toe. She reports no foreign bodies present. The symptoms are aggravated by movement and weight bearing. She has tried ice, heat and NSAIDs for the symptoms. The treatment provided mild relief.  Hypertension This is a chronic problem. The problem is controlled. Pertinent negatives include no chest pain, headaches, palpitations or shortness of breath. Past treatments include beta blockers and diuretics.    Lab Results  Component Value Date   NA 140 11/25/2021   K 3.6 11/25/2021   CO2 27 11/25/2021   GLUCOSE 102 (H) 11/25/2021   BUN 14 11/25/2021   CREATININE 0.78 11/25/2021   CALCIUM 9.4 11/25/2021   EGFR 83 11/25/2021   GFRNONAA >60 06/06/2020   Lab Results  Component Value Date   CHOL 169 11/25/2021   HDL 57 11/25/2021   LDLCALC 79 11/25/2021   TRIG 199 (H) 11/25/2021   CHOLHDL 3.0 11/25/2021   Lab Results  Component Value Date   TSH 3.190 11/25/2021   No results found for: "HGBA1C" Lab Results  Component Value Date   WBC 7.5 11/25/2021   HGB 13.7 11/25/2021   HCT 41.8 11/25/2021   MCV 91 11/25/2021   PLT 324 11/25/2021   Lab Results  Component Value Date   ALT 18 11/25/2021   AST 25 11/25/2021   ALKPHOS 72 11/25/2021   BILITOT 0.3 11/25/2021   No results found for: "25OHVITD2", "25OHVITD3", "VD25OH"   Review of Systems  Constitutional:  Negative for fatigue and unexpected weight change.  HENT:  Negative for nosebleeds.   Eyes:  Negative for visual disturbance.  Respiratory:   Negative for cough, chest tightness, shortness of breath and wheezing.   Cardiovascular:  Negative for chest pain, palpitations and leg swelling.  Gastrointestinal:  Negative for abdominal pain, constipation and diarrhea.  Musculoskeletal:  Positive for arthralgias and gait problem.  Neurological:  Positive for numbness. Negative for dizziness, weakness, light-headedness and headaches.    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/18/2014   Alopecia 12/18/2014   Dependent edema 12/18/2014   History of hepatitis B virus infection 12/18/2014   Hx of breast cancer 12/18/2014   Hyperlipidemia, mild 12/18/2014    Allergies  Allergen Reactions   Penicillins Rash    Has patient had a PCN reaction causing immediate rash, facial/tongue/throat swelling, SOB or lightheadedness with hypotension: No Has patient had a PCN reaction causing severe rash involving mucus membranes or skin necrosis: No Has patient had a PCN reaction that required hospitalization: No Has patient had a PCN reaction occurring within the last 10 years: No If all of the above answers are "NO",  then may proceed with Cephalosporin use.'   Sulfa Antibiotics Rash    Past Surgical History:  Procedure Laterality Date   ABDOMINAL HYSTERECTOMY  1997   AUGMENTATION MAMMAPLASTY Right 46/5681   Silicone   BREAST BIOPSY Right    Positive   BREAST EXCISIONAL BIOPSY Left 02/17/2018   Removed fat necrosis    BREAST REDUCTION SURGERY Left 01/18/2015   Procedure: MAMMARY REDUCTION  (BREAST);  Surgeon: Nicholaus Bloom, MD;   Location: [place];  Service: Plastics;  Laterality: Left;   BREAST REDUCTION SURGERY Left 02/17/2018   Procedure: Left breast reduction with excision of fat necrosis-no liposuction needed;  Surgeon: Wallace Going, DO;  Location: [facility];  Service: Plastics;  Laterality: Left;   PLACEMENT OF BREAST IMPLANTS Right 02/17/2018   Procedure: PLACEMENT OF BREAST IMPLANTS;  Surgeon: Wallace Going, DO;  Location: [facility];  Service: Plastics;  Laterality: Right;   TISSUE EXPANDER PLACEMENT Right 01/18/2015   Procedure: Right  BREAST EXPANDER PLACEMENT WITH ALLODERM;  Surgeon: Nicholaus Bloom, MD;  Location: [place];  Service: Plastics;  Laterality: Right;  DR Tula Nakayama RM 2 PER BRENDA   TISSUE EXPANDER PLACEMENT Right 03/13/2015   Procedure: Right breast tissue expander removal and Sillicone implant placement.;  Surgeon: Nicholaus Bloom, MD;  Location: [place];  Service: Plastics;  Laterality: Right;   TOTAL MASTECTOMY Right 02/2013   stage 1 ER/PR+   VULVECTOMY      Social History   Tobacco Use   Smoking status: Never   Smokeless tobacco: Never  Vaping Use   Vaping Use: Never used  Substance Use Topics   Alcohol use: Yes    Comment: 3-5 glasses of wine weekly   Drug use: No     Medication list has been reviewed and updated.  Current Meds  Medication Sig   aspirin EC 81 MG tablet Take 1 tablet (81 mg total) by mouth daily. Swallow whole.   Calcium Carbonate-Vitamin D (CALCIUM + D PO) Take 1 tablet by mouth 2 (two) times daily.    carvedilol (COREG) 6.25 MG tablet Take 1 tablet (6.25 mg total) by mouth 2 (two) times daily.   ELDERBERRY PO Take by mouth daily. gummies   esomeprazole (NEXIUM) 20 MG capsule TAKE 1 CAPSULE BY MOUTH DAILY AT NOON   ezetimibe (ZETIA) 10 MG tablet Take 1 tablet (10 mg total) by mouth daily.   fexofenadine (ALLEGRA) 180 MG tablet Take 180 mg by mouth daily as needed for allergies.    hydrochlorothiazide (HYDRODIURIL) 25 MG  tablet TAKE 1 TABLET(25 MG) BY MOUTH DAILY   hydrocortisone (ANUSOL-HC) 2.5 % rectal cream Place 1 Application rectally 2 (two) times daily.   Magnesium 500 MG TABS Take 500 mg by mouth daily.    rosuvastatin (CRESTOR) 20 MG tablet Take 1 tablet (20 mg total) by mouth daily.   triamcinolone (NASACORT) 55 MCG/ACT AERO nasal inhaler Place 2 sprays into the nose daily as needed (allergies).    Turmeric 500 MG CAPS Take by mouth. Taking 2 caps daily       03/26/2022    9:10 AM 11/25/2021   10:08 AM 11/23/2020   10:03 AM 11/22/2019    8:08 AM  GAD 7 : Generalized Anxiety Score  Nervous, Anxious, on Edge 0 1 0 0  Control/stop worrying 0 1 0 0  Worry too much - different things 0 1 1 0  Trouble relaxing 0 1 1 0  Restless 0 1 0 0  Easily annoyed  or irritable 0 0 0 0  Afraid - awful might happen 0 0 0 0  Total GAD 7 Score 0 5 2 0  Anxiety Difficulty Not difficult at all Not difficult at all Not difficult at all Not difficult at all       03/26/2022    9:10 AM 11/25/2021   10:08 AM 06/10/2021    9:22 AM  Depression screen PHQ 2/9  Decreased Interest 0 0 0  Down, Depressed, Hopeless 0 0 0  PHQ - 2 Score 0 0 0  Altered sleeping 0 0   Tired, decreased energy 0 1   Change in appetite 0 1   Feeling bad or failure about yourself  0 0   Trouble concentrating 0 0   Moving slowly or fidgety/restless 0 0   Suicidal thoughts 0 0   PHQ-9 Score 0 2   Difficult doing work/chores Not difficult at all Not difficult at all     BP Readings from Last 3 Encounters:  03/26/22 118/78  11/26/21 132/67  11/25/21 128/80    Physical Exam Vitals and nursing note reviewed.  Constitutional:      General: She is not in acute distress.    Appearance: She is well-developed.  HENT:     Head: Normocephalic and atraumatic.  Cardiovascular:     Rate and Rhythm: Normal rate and regular rhythm.  Pulmonary:     Effort: Pulmonary effort is normal. No respiratory distress.     Breath sounds: No wheezing or  rhonchi.  Musculoskeletal:     Cervical back: Normal range of motion.     Lumbar back: No tenderness or bony tenderness.     Right hip: Tenderness (over lateral hip) present. Normal range of motion.     Left hip: No tenderness. Normal range of motion.  Skin:    General: Skin is warm and dry.     Findings: No rash.  Neurological:     Mental Status: She is alert and oriented to person, place, and time.     Sensory: Sensation is intact.     Motor: Motor function is intact.     Gait: Gait abnormal.  Psychiatric:        Mood and Affect: Mood normal.        Behavior: Behavior normal.     Wt Readings from Last 3 Encounters:  03/26/22 169 lb (76.7 kg)  11/26/21 173 lb 12.8 oz (78.8 kg)  11/25/21 172 lb 12.8 oz (78.4 kg)    BP 118/78   Pulse 86   Ht '5\' 6"'$  (1.676 m)   Wt 169 lb (76.7 kg)   SpO2 97%   BMI 27.28 kg/m   Assessment and Plan: Problem List Items Addressed This Visit       Cardiovascular and Mediastinum   Coronary artery disease involving native coronary artery of native heart without angina pectoris (Chronic)    Doing well without chest pain. Followed by cardiology on Crestor, Zetia Coreg added in September for increased BP      Essential hypertension (Chronic)    Clinically stable exam with well controlled BP on Coreg and HCTZ Tolerating medications without side effects at this time. Pt to continue current regimen and low sodium diet; benefits of regular exercise as able discussed.       Other Visit Diagnoses     Pain of right hip    -  Primary   Relevant Medications   predniSONE (DELTASONE) 10 MG tablet   traMADol (ULTRAM)  50 MG tablet   Other Relevant Orders   Ambulatory referral to Orthopedic Surgery        Partially dictated using Dragon software. Any errors are unintentional.  Halina Maidens, MD [facility]  03/26/2022

## 2022-03-26 NOTE — Assessment & Plan Note (Signed)
Symptoms well controlled on daily PP No red flag signs such as weight loss, n/v, melena Will continue Nexium.

## 2022-04-08 ENCOUNTER — Other Ambulatory Visit: Payer: Self-pay | Admitting: Cardiovascular Disease

## 2022-04-14 ENCOUNTER — Telehealth: Payer: Self-pay

## 2022-04-14 NOTE — Telephone Encounter (Signed)
Patient called and left a voicemail stating she needed to cancel her appointment with Dr. Allen Norris on 04/22/2022. Returned patient call and Patient states she had a hemorrhoid and it busted and no longer having rectal bleeding now

## 2022-04-22 ENCOUNTER — Ambulatory Visit: Payer: Medicare HMO | Admitting: Gastroenterology

## 2022-05-23 ENCOUNTER — Telehealth: Payer: Self-pay | Admitting: Internal Medicine

## 2022-05-23 NOTE — Telephone Encounter (Signed)
Contacted [name] to schedule their annual wellness visit. Appointment made for 06/18/2022.  Sherol Dade; Care Guide [facility] Direct Dial: [phone]

## 2022-06-03 ENCOUNTER — Ambulatory Visit: Payer: Medicare HMO | Admitting: Plastic Surgery

## 2022-06-17 ENCOUNTER — Ambulatory Visit: Payer: Medicare HMO | Admitting: Plastic Surgery

## 2022-06-18 ENCOUNTER — Ambulatory Visit (INDEPENDENT_AMBULATORY_CARE_PROVIDER_SITE_OTHER): Payer: Medicare HMO

## 2022-06-18 VITALS — Ht 66.0 in | Wt 169.0 lb

## 2022-06-18 DIAGNOSIS — Z0000 Encounter for general adult medical examination without abnormal findings: Secondary | ICD-10-CM | POA: Diagnosis not present

## 2022-06-18 NOTE — Patient Instructions (Signed)
[name] , Thank you for taking time to come for your Medicare Wellness Visit. I appreciate your ongoing commitment to your health goals. Please review the following plan we discussed and let me know if I can assist you in the future.   These are the goals we discussed:  Goals      DIET - EAT MORE FRUITS AND VEGETABLES        This is a list of the screening recommended for you and due dates:  Health Maintenance  Topic Date Due   COVID-19 Vaccine (6 - 2023-24 season) 04/11/2022   Flu Shot  10/16/2022   Mammogram  12/26/2022   Medicare Annual Wellness Visit  06/18/2023   Colon Cancer Screening  09/24/2023   DTaP/Tdap/Td vaccine (2 - Td or Tdap) 07/17/2027   Pneumonia Vaccine  Completed   DEXA scan (bone density measurement)  Completed   Hepatitis C Screening: USPSTF Recommendation to screen - Ages 65-79 yo.  Completed   Zoster (Shingles) Vaccine  Completed   HPV Vaccine  Aged Out    Advanced directives: no  Conditions/risks identified: none  Next appointment: Follow up in one year for your annual wellness visit 06/24/23 @ 8:15 am by phone   Preventive Care 65 Years and Older, Female Preventive care refers to lifestyle choices and visits with your health care provider that can promote health and wellness. What does preventive care include? A yearly physical exam. This is also called an annual well check. Dental exams once or twice a year. Routine eye exams. Ask your health care provider how often you should have your eyes checked. Personal lifestyle choices, including: Daily care of your teeth and gums. Regular physical activity. Eating a healthy diet. Avoiding tobacco and drug use. Limiting alcohol use. Practicing safe sex. Taking low-dose aspirin every day. Taking vitamin and mineral supplements as recommended by your health care provider. What happens during an annual well check? The services and screenings done by your health care provider during your annual well  check will depend on your age, overall health, lifestyle risk factors, and family history of disease. Counseling  Your health care provider may ask you questions about your: Alcohol use. Tobacco use. Drug use. Emotional well-being. Home and relationship well-being. Sexual activity. Eating habits. History of falls. Memory and ability to understand (cognition). Work and work Statistician. Reproductive health. Screening  You may have the following tests or measurements: Height, weight, and BMI. Blood pressure. Lipid and cholesterol levels. These may be checked every 5 years, or more frequently if you are over 66 years old. Skin check. Lung cancer screening. You may have this screening every year starting at age 45 if you have a 30-pack-year history of smoking and currently smoke or have quit within the past 15 years. Fecal occult blood test (FOBT) of the stool. You may have this test every year starting at age 29. Flexible sigmoidoscopy or colonoscopy. You may have a sigmoidoscopy every 5 years or a colonoscopy every 10 years starting at age 75. Hepatitis C blood test. Hepatitis B blood test. Sexually transmitted disease (STD) testing. Diabetes screening. This is done by checking your blood sugar (glucose) after you have not eaten for a while (fasting). You may have this done every 1-3 years. Bone density scan. This is done to screen for osteoporosis. You may have this done starting at age 52. Mammogram. This may be done every 1-2 years. Talk to your health care provider about how often you should have regular mammograms.  Talk with your health care provider about your test results, treatment options, and if necessary, the need for more tests. Vaccines  Your health care provider may recommend certain vaccines, such as: Influenza vaccine. This is recommended every year. Tetanus, diphtheria, and acellular pertussis (Tdap, Td) vaccine. You may need a Td booster every 10 years. Zoster  vaccine. You may need this after age 34. Pneumococcal 13-valent conjugate (PCV13) vaccine. One dose is recommended after age 69. Pneumococcal polysaccharide (PPSV23) vaccine. One dose is recommended after age 61. Talk to your health care provider about which screenings and vaccines you need and how often you need them. This information is not intended to replace advice given to you by your health care provider. Make sure you discuss any questions you have with your health care provider. Document Released: 03/30/2015 Document Revised: 11/21/2015 Document Reviewed: 01/02/2015 Elsevier Interactive Patient Education  2017 [place] Prevention in the Home Falls can cause injuries. They can happen to people of all ages. There are many things you can do to make your home safe and to help prevent falls. What can I do on the outside of my home? Regularly fix the edges of walkways and driveways and fix any cracks. Remove anything that might make you trip as you walk through a door, such as a raised step or threshold. Trim any bushes or trees on the path to your home. Use bright outdoor lighting. Clear any walking paths of anything that might make someone trip, such as rocks or tools. Regularly check to see if handrails are loose or broken. Make sure that both sides of any steps have handrails. Any raised decks and porches should have guardrails on the edges. Have any leaves, snow, or ice cleared regularly. Use sand or salt on walking paths during winter. Clean up any spills in your garage right away. This includes oil or grease spills. What can I do in the bathroom? Use night lights. Install grab bars by the toilet and in the tub and shower. Do not use towel bars as grab bars. Use non-skid mats or decals in the tub or shower. If you need to sit down in the shower, use a plastic, non-slip stool. Keep the floor dry. Clean up any water that spills on the floor as soon as it happens. Remove  soap buildup in the tub or shower regularly. Attach bath mats securely with double-sided non-slip rug tape. Do not have throw rugs and other things on the floor that can make you trip. What can I do in the bedroom? Use night lights. Make sure that you have a light by your bed that is easy to reach. Do not use any sheets or blankets that are too big for your bed. They should not hang down onto the floor. Have a firm chair that has side arms. You can use this for support while you get dressed. Do not have throw rugs and other things on the floor that can make you trip. What can I do in the kitchen? Clean up any spills right away. Avoid walking on wet floors. Keep items that you use a lot in easy-to-reach places. If you need to reach something above you, use a strong step stool that has a grab bar. Keep electrical cords out of the way. Do not use floor polish or wax that makes floors slippery. If you must use wax, use non-skid floor wax. Do not have throw rugs and other things on the floor that can make  you trip. What can I do with my stairs? Do not leave any items on the stairs. Make sure that there are handrails on both sides of the stairs and use them. Fix handrails that are broken or loose. Make sure that handrails are as long as the stairways. Check any carpeting to make sure that it is firmly attached to the stairs. Fix any carpet that is loose or worn. Avoid having throw rugs at the top or bottom of the stairs. If you do have throw rugs, attach them to the floor with carpet tape. Make sure that you have a light switch at the top of the stairs and the bottom of the stairs. If you do not have them, ask someone to add them for you. What else can I do to help prevent falls? Wear shoes that: Do not have high heels. Have rubber bottoms. Are comfortable and fit you well. Are closed at the toe. Do not wear sandals. If you use a stepladder: Make sure that it is fully opened. Do not climb a  closed stepladder. Make sure that both sides of the stepladder are locked into place. Ask someone to hold it for you, if possible. Clearly mark and make sure that you can see: Any grab bars or handrails. First and last steps. Where the edge of each step is. Use tools that help you move around (mobility aids) if they are needed. These include: Canes. Walkers. Scooters. Crutches. Turn on the lights when you go into a dark area. Replace any light bulbs as soon as they burn out. Set up your furniture so you have a clear path. Avoid moving your furniture around. If any of your floors are uneven, fix them. If there are any pets around you, be aware of where they are. Review your medicines with your doctor. Some medicines can make you feel dizzy. This can increase your chance of falling. Ask your doctor what other things that you can do to help prevent falls. This information is not intended to replace advice given to you by your health care provider. Make sure you discuss any questions you have with your health care provider. Document Released: 12/28/2008 Document Revised: 08/09/2015 Document Reviewed: 04/07/2014 Elsevier Interactive Patient Education  2017 [employer].

## 2022-06-18 NOTE — Progress Notes (Signed)
I connected with  [name] on 06/18/22 by a audio enabled telemedicine application and verified that I am speaking with the correct person using two identifiers.  Patient Location: Home  Provider Location: Office/Clinic  I discussed the limitations of evaluation and management by telemedicine. The patient expressed understanding and agreed to proceed.  Subjective:   [name] is a 72 y.o. female who presents for Medicare Annual (Subsequent) preventive examination.  Review of Systems     Cardiac Risk Factors include: advanced age (>13men, >4 women);hypertension;dyslipidemia     Objective:    There were no vitals filed for this visit. There is no height or weight on file to calculate BMI.     06/18/2022    8:22 AM 06/10/2021    9:24 AM 04/01/2018   10:26 AM 02/17/2018    6:25 AM 07/22/2017   10:52 AM 12/03/2016   10:57 AM 10/03/2015    9:32 AM  Advanced Directives  Does Patient Have a Medical Advance Directive? No No No No No No No  Would patient like information on creating a medical advance directive? No - Patient declined No - Patient declined No - Patient declined No - Patient declined  No - Patient declined No - patient declined information    Current Medications (verified) Outpatient Encounter Medications as of 06/18/2022  Medication Sig   aspirin EC 81 MG tablet Take 1 tablet (81 mg total) by mouth daily. Swallow whole.   Calcium Carbonate-Vitamin D (CALCIUM + D PO) Take 1 tablet by mouth 2 (two) times daily.    carvedilol (COREG) 6.25 MG tablet Take 1 tablet (6.25 mg total) by mouth 2 (two) times daily.   ELDERBERRY PO Take by mouth daily. gummies   esomeprazole (NEXIUM) 20 MG capsule TAKE 1 CAPSULE BY MOUTH DAILY AT NOON   ezetimibe (ZETIA) 10 MG tablet Take 1 tablet (10 mg total) by mouth daily.   fexofenadine (ALLEGRA) 180 MG tablet Take 180 mg by mouth daily as needed for allergies.    hydrochlorothiazide (HYDRODIURIL) 25 MG tablet TAKE 1 TABLET BY MOUTH  EVERY DAY   hydrocortisone (ANUSOL-HC) 2.5 % rectal cream Place 1 Application rectally 2 (two) times daily.   Magnesium 500 MG TABS Take 500 mg by mouth daily.    rosuvastatin (CRESTOR) 20 MG tablet Take 1 tablet (20 mg total) by mouth daily.   triamcinolone (NASACORT) 55 MCG/ACT AERO nasal inhaler Place 2 sprays into the nose daily as needed (allergies).    Turmeric 500 MG CAPS Take by mouth. Taking 2 caps daily   No facility-administered encounter medications on file as of 06/18/2022.    Allergies (verified) Other, Misc. sulfonamide containing compounds, Penicillins, and Sulfa antibiotics   History: Past Medical History:  Diagnosis Date   Arthritis    Breast cancer 02/2014   Cancer    Vulvular   GERD (gastroesophageal reflux disease)    Hepatitis    Hep B in 1981   PONV (postoperative nausea and vomiting)    pt states scopalamine patch helped and zofran and phenergan helped   Past Surgical History:  Procedure Laterality Date   [place] Right [id number]   Silicone   BREAST BIOPSY Right    Positive   BREAST EXCISIONAL BIOPSY Left 02/17/2018   Removed fat necrosis    BREAST REDUCTION SURGERY Left 01/18/2015   Procedure: MAMMARY REDUCTION  (BREAST);  Surgeon: Nicholaus Bloom, MD;  Location: [place];  Service: Plastics;  Laterality: Left;   BREAST REDUCTION SURGERY Left 02/17/2018   Procedure: Left breast reduction with excision of fat necrosis-no liposuction needed;  Surgeon: Wallace Going, DO;  Location: [facility];  Service: Plastics;  Laterality: Left;   PLACEMENT OF BREAST IMPLANTS Right 02/17/2018   Procedure: PLACEMENT OF BREAST IMPLANTS;  Surgeon: Wallace Going, DO;  Location: [facility];  Service: Plastics;  Laterality: Right;   TISSUE EXPANDER PLACEMENT Right 01/18/2015   Procedure: Right  BREAST EXPANDER PLACEMENT WITH ALLODERM;  Surgeon: Nicholaus Bloom, MD;  Location: [place];  Service: Plastics;   Laterality: Right;  DR Tula Nakayama RM 2 PER BRENDA   TISSUE EXPANDER PLACEMENT Right 03/13/2015   Procedure: Right breast tissue expander removal and Sillicone implant placement.;  Surgeon: Nicholaus Bloom, MD;  Location: [place];  Service: Plastics;  Laterality: Right;   TOTAL MASTECTOMY Right 02/2013   stage 1 ER/PR+   VULVECTOMY     Family History  Problem Relation Age of Onset   Hypertension Father    Diabetes Father    Stroke Father    Diabetes Brother    Hypertension Mother    Thyroid disease Mother    Leukemia Maternal Grandmother    Breast cancer Paternal Grandmother    Breast cancer Cousin    Social History   Socioeconomic History   Marital status: Married    Spouse name: Not on file   Number of children: 2   Years of education: Not on file   Highest education level: Not on file  Occupational History   Not on file  Tobacco Use   Smoking status: Never   Smokeless tobacco: Never  Vaping Use   Vaping Use: Never used  Substance and Sexual Activity   Alcohol use: Yes    Comment: 3-5 glasses of wine weekly   Drug use: No   Sexual activity: Yes  Other Topics Concern   Not on file  Social History Narrative   Not on file   Social Determinants of Health   Financial Resource Strain: Low Risk  (06/18/2022)   Overall Financial Resource Strain (CARDIA)    Difficulty of Paying Living Expenses: Not hard at all  Food Insecurity: No Food Insecurity (06/18/2022)   Hunger Vital Sign    Worried About Running Out of Food in the Last Year: Never true    Ran Out of Food in the Last Year: Never true  Transportation Needs: No Transportation Needs (06/18/2022)   PRAPARE - Hydrologist (Medical): No    Lack of Transportation (Non-Medical): No  Physical Activity: Sufficiently Active (06/18/2022)   Exercise Vital Sign    Days of Exercise per Week: 3 days    Minutes of Exercise per Session: 60 min  Stress: No Stress Concern Present (06/18/2022)   [place]    Feeling of Stress : Not at all  Social Connections: Moderately Integrated (06/18/2022)   Social Connection and Isolation Panel [NHANES]    Frequency of Communication with Friends and Family: More than three times a week    Frequency of Social Gatherings with Friends and Family: Once a week    Attends Religious Services: Never    Marine scientist or Organizations: Yes    Attends Music therapist: More than 4 times per year    Marital Status: Married    Tobacco Counseling Counseling given: Not Answered   Clinical Intake:  Airline pilot  completed: Yes  Pain : No/denies pain     Nutritional Risks: None Diabetes: No  How often do you need to have someone help you when you read instructions, pamphlets, or other written materials from your doctor or pharmacy?: 1 - Never  Diabetic?no  Interpreter Needed?: No  Information entered by :: Kirke Shaggy, LPN   Activities of Daily Living    06/18/2022    8:22 AM 06/14/2022    8:51 AM  In your present state of health, do you have any difficulty performing the following activities:  Hearing? 0 0  Vision? 0 0  Difficulty concentrating or making decisions? 0 0  Walking or climbing stairs? 0 0  Dressing or bathing? 0 0  Doing errands, shopping? 0 0  Preparing Food and eating ? N N  Using the Toilet? N N  In the past six months, have you accidently leaked urine? N N  Do you have problems with loss of bowel control? N N  Managing your Medications? N N  Managing your Finances? N N  Housekeeping or managing your Housekeeping? N N    Patient Care Team: Glean Hess, MD as PCP - General (Internal Medicine) Wellington Hampshire, MD as Consulting Physician (Cardiology) Brendolyn Patty, MD (Dermatology) Dillingham, Loel Lofty, DO as Attending Physician (Plastic Surgery)  Indicate any recent Medical Services you may have received from other  than [facility] providers in the past year (date may be approximate).     Assessment:   This is a routine wellness examination for Larene.  Hearing/Vision screen Hearing Screening - Comments:: No aids Vision Screening - Comments:: Wears glasses- MD in [place] issues and exercise activities discussed: Current Exercise Habits: Home exercise routine, Type of exercise: walking, Time (Minutes): 60, Frequency (Times/Week): 3, Weekly Exercise (Minutes/Week): 180, Intensity: Mild   Goals Addressed             This Visit's Progress    DIET - EAT MORE FRUITS AND VEGETABLES         Depression Screen    06/18/2022    8:20 AM 03/26/2022    9:10 AM 11/25/2021   10:08 AM 06/10/2021    9:22 AM 11/23/2020   10:03 AM 11/22/2019    8:07 AM 11/17/2018    8:43 AM  PHQ 2/9 Scores  PHQ - 2 Score 0 0 0 0 0 0 0  PHQ- 9 Score 0 0 2  2 0 0    Fall Risk    06/18/2022    8:22 AM 06/14/2022    8:51 AM 03/26/2022    9:10 AM 11/25/2021   10:08 AM 06/10/2021    9:26 AM  Fall Risk   Falls in the past year? 0 0 0 0 0  Number falls in past yr: 0  0 0 0  Injury with Fall? 0  0 0 0  Risk for fall due to : No Fall Risks  No Fall Risks No Fall Risks No Fall Risks  Follow up Falls prevention discussed;Falls evaluation completed  Falls evaluation completed Falls evaluation completed Falls prevention discussed    FALL RISK PREVENTION PERTAINING TO THE HOME:  Any stairs in or around the home? Yes  If so, are there any without handrails? No  Home free of loose throw rugs in walkways, pet beds, electrical cords, etc? Yes  Adequate lighting in your home to reduce risk of falls? Yes   ASSISTIVE DEVICES UTILIZED TO PREVENT FALLS:  Life alert? No  Use  of a cane, walker or w/c? No  Grab bars in the bathroom? No  Shower chair or bench in shower? Yes  Elevated toilet seat or a handicapped toilet? No    Cognitive Function:        06/18/2022    8:26 AM  6CIT Screen  What Year? 0 points  What month? 0 points   What time? 0 points  Count back from 20 0 points  Months in reverse 0 points  Repeat phrase 0 points  Total Score 0 points    Immunizations Immunization History  Administered Date(s) Administered   COVID-19, mRNA, vaccine(Comirnaty)12 years and older 02/14/2022   Fluad Quad(high Dose 65+) 11/23/2020, 11/25/2021   Influenza-Unspecified 12/18/2014, 11/26/2017, 01/24/2019   Moderna Covid-19 Vaccine Bivalent Booster 1yrs & up 01/02/2021   PFIZER(Purple Top)SARS-COV-2 Vaccination 03/08/2019, 03/29/2019, 12/30/2019   Pneumococcal Conjugate-13 11/17/2018   Pneumococcal Polysaccharide-23 11/22/2019   Respiratory Syncytial Virus Vaccine,Recomb Aduvanted(Arexvy) 02/14/2022   Tdap 07/16/2017   Zoster Recombinat (Shingrix) 01/24/2019, 04/18/2019   Zoster, Live 03/19/2011    TDAP status: Up to date  Flu Vaccine status: Up to date  Pneumococcal vaccine status: Up to date  Covid-19 vaccine status: Completed vaccines  Qualifies for Shingles Vaccine? Yes   Zostavax completed Yes   Shingrix Completed?: Yes  Screening Tests Health Maintenance  Topic Date Due   COVID-19 Vaccine (6 - 2023-24 season) 04/11/2022   INFLUENZA VACCINE  10/16/2022   MAMMOGRAM  12/26/2022   Medicare Annual Wellness (AWV)  06/18/2023   COLONOSCOPY (Pts 45-11yrs Insurance coverage will need to be confirmed)  09/24/2023   DTaP/Tdap/Td (2 - Td or Tdap) 07/17/2027   Pneumonia Vaccine 66+ Years old  Completed   DEXA SCAN  Completed   Hepatitis C Screening  Completed   Zoster Vaccines- Shingrix  Completed   HPV VACCINES  Aged Out    Health Maintenance  Health Maintenance Due  Topic Date Due   COVID-19 Vaccine (6 - 2023-24 season) 04/11/2022    Colorectal cancer screening: Type of screening: Colonoscopy. Completed 09/23/13. Repeat every 10 years  Mammogram status: Completed 12/25/21. Repeat every year  Bone Density status: Completed 12/18/20. Results reflect: Bone density results: NORMAL. Repeat every 5  years.  Lung Cancer Screening: (Low Dose CT Chest recommended if Age 36-80 years, 30 pack-year currently smoking OR have quit w/in 15years.) does not qualify.   Additional Screening:  Hepatitis C Screening: does qualify; Completed 07/16/17  Vision Screening: Recommended annual ophthalmology exams for early detection of glaucoma and other disorders of the eye. Is the patient up to date with their annual eye exam?  Yes  Who is the provider or what is the name of the office in which the patient attends annual eye exams? [facility] in [place] If pt is not established with a provider, would they like to be referred to a provider to establish care? No .   Dental Screening: Recommended annual dental exams for proper oral hygiene  Community Resource Referral / Chronic Care Management: CRR required this visit?  No   CCM required this visit?  No      Plan:     I have personally reviewed and noted the following in the patient's chart:   Medical and social history Use of alcohol, tobacco or illicit drugs  Current medications and supplements including opioid prescriptions. Patient is not currently taking opioid prescriptions. Functional ability and status Nutritional status Physical activity Advanced directives List of other physicians Hospitalizations, surgeries, and ER visits in previous 23  months Vitals Screenings to include cognitive, depression, and falls Referrals and appointments  In addition, I have reviewed and discussed with patient certain preventive protocols, quality metrics, and best practice recommendations. A written personalized care plan for preventive services as well as general preventive health recommendations were provided to patient.     Dionisio David, LPN   [phone]   Nurse Notes: none

## 2022-07-08 ENCOUNTER — Encounter: Payer: Self-pay | Admitting: Plastic Surgery

## 2022-07-08 ENCOUNTER — Other Ambulatory Visit: Payer: Self-pay | Admitting: Surgical

## 2022-07-08 ENCOUNTER — Ambulatory Visit
Admission: RE | Admit: 2022-07-08 | Discharge: 2022-07-08 | Disposition: A | Discharge status: Home / Self Care | Payer: Medicare HMO | Source: Ambulatory Visit | Attending: Surgical | Admitting: Surgical

## 2022-07-08 ENCOUNTER — Ambulatory Visit: Payer: Medicare HMO | Admitting: Plastic Surgery

## 2022-07-08 DIAGNOSIS — N651 Disproportion of reconstructed breast: Secondary | ICD-10-CM

## 2022-07-08 DIAGNOSIS — Z9011 Acquired absence of right breast and nipple: Secondary | ICD-10-CM

## 2022-07-08 DIAGNOSIS — Z853 Personal history of malignant neoplasm of breast: Secondary | ICD-10-CM

## 2022-07-08 NOTE — Progress Notes (Signed)
   Subjective:    Patient ID: [name], female    DOB: 03-23-1950, 72 y.o.   MRN: [id number]  The patient is a 72 year old female here for a yearly visit on her breast reconstruction.  She was diagnosed with breast cancer in 2006 and underwent expander placement after mastectomy.  She presented to me in 2019 for revision.  A capsulectomy and repositioning was not done of the right breast implant and then she had a left breast mastopexy reduction for symmetry.  She had a Mentor smooth round high-profile gel 400 cc implant placed in December 2019.  She has done well since then.  She had a mammogram October 2023 which was benign.  She had an ultrasound today and those results are not available to me.  That was just to check the integrity of the implant.  There are no ongoing concerns and the patient is happy with her overall appearance.  She is not interested in tattooing.  She does complain of some itching around the areola of the left breast.  She has a new grandson who is 2 months old named Loss adjuster, chartered.      Review of Systems  Constitutional: Negative.   HENT: Negative.    Eyes: Negative.   Respiratory: Negative.    Cardiovascular: Negative.   Gastrointestinal: Negative.   Endocrine: Negative.   Genitourinary: Negative.        Objective:   Physical Exam Vitals and nursing note reviewed.  Constitutional:      Appearance: Normal appearance.  HENT:     Head: Normocephalic and atraumatic.  Cardiovascular:     Rate and Rhythm: Normal rate.     Pulses: Normal pulses.  Pulmonary:     Effort: Pulmonary effort is normal.  Skin:    General: Skin is warm.     Capillary Refill: Capillary refill takes less than 2 seconds.  Neurological:     Mental Status: She is alert and oriented to person, place, and time.  Psychiatric:        Mood and Affect: Mood normal.        Behavior: Behavior normal.        Thought Content: Thought content normal.        Judgment: Judgment normal.          Assessment & Plan:     ICD-10-CM   1. Acquired absence of right breast  Z90.11     2. Breast asymmetry following reconstructive surgery  N65.1        Kenalog 0.2 cc mixed with 0.1 cc of lidocaine was injected under the areola of the left breast to see if that would help with the ongoing itching.  She can also use Benadryl lotion or even a Benadryl at night if this is persistent.  I would like to see her back in 1 year.

## 2022-09-21 ENCOUNTER — Other Ambulatory Visit: Payer: Self-pay | Admitting: Cardiovascular Disease

## 2022-10-21 ENCOUNTER — Other Ambulatory Visit: Payer: Self-pay | Admitting: Internal Medicine

## 2022-10-21 DIAGNOSIS — Z1231 Encounter for screening mammogram for malignant neoplasm of breast: Secondary | ICD-10-CM

## 2022-11-07 ENCOUNTER — Other Ambulatory Visit: Payer: Self-pay | Admitting: Cardiovascular Disease

## 2022-11-07 DIAGNOSIS — E782 Mixed hyperlipidemia: Secondary | ICD-10-CM

## 2022-11-23 ENCOUNTER — Other Ambulatory Visit: Payer: Self-pay | Admitting: Internal Medicine

## 2022-11-23 ENCOUNTER — Other Ambulatory Visit: Payer: Self-pay | Admitting: Cardiovascular Disease

## 2022-11-23 DIAGNOSIS — E782 Mixed hyperlipidemia: Secondary | ICD-10-CM

## 2022-11-23 DIAGNOSIS — K219 Gastro-esophageal reflux disease without esophagitis: Secondary | ICD-10-CM

## 2022-11-24 NOTE — Telephone Encounter (Signed)
*  STAT* If patient is at the pharmacy, call can be transferred to refill team.   1. Which medications need to be refilled? (please list name of each medication and dose if known) Carvedilol   2. Would you like to learn more about the convenience, safety, & potential cost savings by using the [facility]?    3. Are you open to using the [facility] (Type [facility]..   4. Which pharmacy/location (including street and city if local pharmacy) is medication to be sent to?  [facility] [place]-  [phone]   5. Do they need a 30 day or 90 day supply? Enough until her appointment on Thursday- please call today- out of medicine

## 2022-11-27 ENCOUNTER — Ambulatory Visit: Payer: Medicare HMO | Attending: Cardiovascular Disease | Admitting: Cardiovascular Disease

## 2022-11-27 ENCOUNTER — Encounter: Payer: Medicare HMO | Admitting: Internal Medicine

## 2022-11-27 ENCOUNTER — Encounter: Payer: Self-pay | Admitting: Cardiovascular Disease

## 2022-11-27 VITALS — BP 124/80 | HR 71 | Ht 65.5 in | Wt 171.0 lb

## 2022-11-27 DIAGNOSIS — I10 Essential (primary) hypertension: Secondary | ICD-10-CM | POA: Diagnosis not present

## 2022-11-27 DIAGNOSIS — I2510 Atherosclerotic heart disease of native coronary artery without angina pectoris: Secondary | ICD-10-CM | POA: Diagnosis not present

## 2022-11-27 DIAGNOSIS — E785 Hyperlipidemia, unspecified: Secondary | ICD-10-CM

## 2022-11-27 MED ORDER — ROSUVASTATIN CALCIUM 20 MG PO TABS: 20.0000 mg | ORAL_TABLET | Freq: Every day | ORAL | 3 refills | Status: DC

## 2022-11-27 MED ORDER — EZETIMIBE 10 MG PO TABS: 10.0000 mg | ORAL_TABLET | Freq: Every day | ORAL | 3 refills | Status: DC

## 2022-11-27 MED ORDER — HYDROCHLOROTHIAZIDE 25 MG PO TABS: ORAL_TABLET | ORAL | 3 refills | Status: DC

## 2022-11-27 MED ORDER — CARVEDILOL 6.25 MG PO TABS: 6.2500 mg | ORAL_TABLET | Freq: Two times a day (BID) | ORAL | 3 refills | Status: DC

## 2022-11-27 NOTE — Progress Notes (Signed)
Cardiology Office Note   Date:  11/27/2022   ID:  [name], DOB 10/21/1950, MRN [id number]  PCP:  Reubin Milan, MD  Cardiologist:   Lorine Bears, MD   Chief Complaint  Patient presents with   Follow-up    12 Month f/u c/o occ. Palpitations and sob w/exertion. Meds reviewed verbally with pt.       History of Present Illness: [name] is a 72 y.o. female who is here today for follow-up visit regarding mild to moderate coronary artery disease.   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  Carotid Doppler in March of 2021 showed no significant disease.  Cardiac CTA in June 2021 showed a calcium score of 274 which was in the 90th percentile for age and sex matched control.  There was evidence of moderate stenosis in the mid LAD and mild disease in the proximal LAD as well as ostial and distal right coronary artery.  None of the lesions were significant by FFR.  Echocardiogram showed normal LV systolic function and mild mitral regurgitation.  No evidence of pulmonary hypertension.   She has been treated medically.  Her blood pressure was elevated last year and thus carvedilol was added.  Unfortunately, her brother died earlier this year after a stroke or heart attack.  She had 2 new grandbabies. She has been doing well overall with no chest pain or worsening dyspnea.  She exercises on a regular basis with no issues.  No leg claudication.  She reports improvement in palpitations since she was started on carvedilol.   Past Medical History:  Diagnosis Date   Arthritis    Breast cancer (HCC) 02/2014   Cancer (HCC)    Vulvular   GERD (gastroesophageal reflux disease)    Hepatitis    Hep B in 1981   PONV (postoperative nausea and vomiting)    pt states scopalamine patch helped and zofran and phenergan helped    Past Surgical History:   Procedure Laterality Date   ABDOMINAL HYSTERECTOMY  1997   AUGMENTATION MAMMAPLASTY Right 02/2015   Silicone   BREAST BIOPSY Right    Positive   BREAST EXCISIONAL BIOPSY Left 02/17/2018   Removed fat necrosis    BREAST REDUCTION SURGERY Left 01/18/2015   Procedure: MAMMARY REDUCTION  (BREAST);  Surgeon: Caryl Asp, MD;  Location: [facility];  Service: Plastics;  Laterality: Left;   BREAST REDUCTION SURGERY Left 02/17/2018   Procedure: Left breast reduction with excision of fat necrosis-no liposuction needed;  Surgeon: Peggye Form, DO;  Location: [facility];  Service: Plastics;  Laterality: Left;   PLACEMENT OF BREAST IMPLANTS Right 02/17/2018   Procedure: PLACEMENT OF BREAST IMPLANTS;  Surgeon: Peggye Form, DO;  Location: [facility];  Service: Plastics;  Laterality: Right;   TISSUE EXPANDER PLACEMENT Right 01/18/2015   Procedure: Right  BREAST EXPANDER PLACEMENT WITH ALLODERM;  Surgeon: Caryl Asp, MD;  Location[facility];  Service: Plastics;  Laterality: Right;  DR Meriam Sprague RM 2 PER BRENDA   TISSUE EXPANDER PLACEMENT Right 03/13/2015   Procedure: Right breast tissue expander removal and Sillicone implant placement.;  Surgeon: Caryl Asp, MD;  Location: [facility];  Service: Plastics;  Laterality: Right;   TOTAL MASTECTOMY Right 02/2013   stage 1 ER/PR+   VULVECTOMY       Current Outpatient Medications  Medication Sig  Dispense Refill   aspirin EC 81 MG tablet Take 1 tablet (81 mg total) by mouth daily. Swallow whole.     Calcium Carbonate-Vitamin D (CALCIUM + D PO) Take 1 tablet by mouth 2 (two) times daily.      ELDERBERRY PO Take by mouth daily. gummies     esomeprazole (NEXIUM) 20 MG capsule TAKE 1 CAPSULE BY MOUTH DAILY AT NOON 90 capsule 3   fexofenadine (ALLEGRA) 180 MG tablet Take 180 mg by mouth daily as needed for allergies.      hydrocortisone (ANUSOL-HC) 2.5 % rectal cream Place 1 Application rectally 2 (two) times daily. 30 g 2    Magnesium 500 MG TABS Take 500 mg by mouth daily.      triamcinolone (NASACORT) 55 MCG/ACT AERO nasal inhaler Place 2 sprays into the nose daily as needed (allergies).      Turmeric 500 MG CAPS Take by mouth. Taking 2 caps daily     carvedilol (COREG) 6.25 MG tablet Take 1 tablet (6.25 mg total) by mouth 2 (two) times daily. 180 tablet 3   ezetimibe (ZETIA) 10 MG tablet Take 1 tablet (10 mg total) by mouth daily. 90 tablet 3   hydrochlorothiazide (HYDRODIURIL) 25 MG tablet TAKE 1 TABLET BY MOUTH EVERY DAY 90 tablet 3   rosuvastatin (CRESTOR) 20 MG tablet Take 1 tablet (20 mg total) by mouth daily. 90 tablet 3   No current facility-administered medications for this visit.    Allergies:   Other, Misc. sulfonamide containing compounds, Penicillins, and Sulfa antibiotics    Social History:  The patient  reports that she has never smoked. She has never used smokeless tobacco. She reports current alcohol use. She reports that she does not use drugs.   Family History:  The patient's family history includes Breast cancer in her cousin and paternal grandmother; Diabetes in her brother and father; Hypertension in her father and mother; Leukemia in her maternal grandmother; Stroke in her father; Thyroid disease in her mother.    ROS:  Please see the history of present illness.   Otherwise, review of systems are positive for none.   All other systems are reviewed and negative.    PHYSICAL EXAM: VS:  BP 124/80 (BP Location: Left Arm, Patient Position: Sitting, Cuff Size: Normal)   Pulse 71   Ht 5' 5.5" (1.664 m)   Wt 171 lb (77.6 kg)   SpO2 98%   BMI 28.02 kg/m  , BMI Body mass index is 28.02 kg/m. GEN: Well nourished, well developed, in no acute distress  HEENT: normal  Neck: no JVD, carotid bruits, or masses Cardiac: RRR; no murmurs, rubs, or gallops, trace bilateral leg edema Respiratory:  clear to auscultation bilaterally, normal work of breathing GI: soft, nontender, nondistended, +  BS MS: no deformity or atrophy  Skin: warm and dry, no rash Neuro:  Strength and sensation are intact Psych: euthymic mood, full affect   EKG:  EKG is ordered today. The ekg ordered today demonstrates: Normal sinus rhythm Nonspecific ST and T wave abnormality       Recent Labs: No results found for requested labs within last 365 days.    Lipid Panel    Component Value Date/Time   CHOL 169 11/25/2021 1145   TRIG 199 (H) 11/25/2021 1145   HDL 57 11/25/2021 1145   CHOLHDL 3.0 11/25/2021 1145   CHOLHDL 2.1 02/03/2020 0757   VLDL 17 02/03/2020 0757   LDLCALC 79 11/25/2021 1145  Wt Readings from Last 3 Encounters:  11/27/22 171 lb (77.6 kg)  06/18/22 169 lb (76.7 kg)  03/26/22 169 lb (76.7 kg)          08/11/2019    2:27 PM  PAD Screen  Previous PAD dx? No  Previous surgical procedure? No  Pain with walking? No  Feet/toe relief with dangling? No  Painful, non-healing ulcers? No  Extremities discolored? No      ASSESSMENT AND PLAN:  1.  Coronary artery disease involving native coronary arteries without angina: CTA showed mild to moderate nonobstructive coronary artery disease with elevated calcium score.  Continue aggressive treatment of risk factors.  Continue low-dose aspirin.  2.  Hyperlipidemia: We refilled both rosuvastatin and ezetimibe today.  She is going to see Dr. Judithann Graves next month for a physical and will get labs then.  3.  Essential hypertension: Her blood pressure improved since the addition of carvedilol.  This will be continued with hydrochlorothiazide and both were refilled today.    Disposition:   FU with me in 12 months.  Signed,  Lorine Bears, MD  11/27/2022 9:32 AM    [facility]

## 2022-11-27 NOTE — Patient Instructions (Signed)
Medication Instructions:  No changes *If you need a refill on your cardiac medications before your next appointment, please call your pharmacy*   Lab Work: None ordered If you have labs (blood work) drawn today and your tests are completely normal, you will receive your results only by: MyChart Message (if you have MyChart) OR A paper copy in the mail If you have any lab test that is abnormal or we need to change your treatment, we will call you to review the results.   Testing/Procedures: None ordered   Follow-Up: At [facility], you and your health needs are our priority.  As part of our continuing mission to provide you with exceptional heart care, we have created designated Provider Care Teams.  These Care Teams include your primary Cardiologist (physician) and Advanced Practice Providers (APPs -  Physician Assistants and Nurse Practitioners) who all work together to provide you with the care you need, when you need it.  We recommend signing up for the patient portal called "MyChart".  Sign up information is provided on this After Visit Summary.  MyChart is used to connect with patients for Virtual Visits (Telemedicine).  Patients are able to view lab/test results, encounter notes, upcoming appointments, etc.  Non-urgent messages can be sent to your provider as well.   To learn more about what you can do with MyChart, go to [email or web address].    Your next appointment:   12 month(s)  Provider:   You may see Dr. Arida or one of the following Advanced Practice Providers on your designated Care Team:   Christopher Berge, NP Ryan Dunn, PA-C Cadence Furth, PA-C Sheri Hammock, NP    

## 2022-12-26 ENCOUNTER — Ambulatory Visit (INDEPENDENT_AMBULATORY_CARE_PROVIDER_SITE_OTHER): Payer: Medicare HMO | Admitting: Internal Medicine

## 2022-12-26 ENCOUNTER — Encounter: Payer: Self-pay | Admitting: Internal Medicine

## 2022-12-26 VITALS — BP 126/78 | HR 76 | Ht 65.5 in | Wt 168.0 lb

## 2022-12-26 DIAGNOSIS — M7071 Other bursitis of hip, right hip: Secondary | ICD-10-CM | POA: Diagnosis not present

## 2022-12-26 DIAGNOSIS — Z0000 Encounter for general adult medical examination without abnormal findings: Secondary | ICD-10-CM | POA: Diagnosis not present

## 2022-12-26 DIAGNOSIS — I10 Essential (primary) hypertension: Secondary | ICD-10-CM | POA: Diagnosis not present

## 2022-12-26 DIAGNOSIS — Z1231 Encounter for screening mammogram for malignant neoplasm of breast: Secondary | ICD-10-CM | POA: Diagnosis not present

## 2022-12-26 DIAGNOSIS — Z23 Encounter for immunization: Secondary | ICD-10-CM

## 2022-12-26 DIAGNOSIS — E785 Hyperlipidemia, unspecified: Secondary | ICD-10-CM

## 2022-12-26 DIAGNOSIS — I2510 Atherosclerotic heart disease of native coronary artery without angina pectoris: Secondary | ICD-10-CM

## 2022-12-26 DIAGNOSIS — Z1211 Encounter for screening for malignant neoplasm of colon: Secondary | ICD-10-CM

## 2022-12-26 DIAGNOSIS — R739 Hyperglycemia, unspecified: Secondary | ICD-10-CM | POA: Diagnosis not present

## 2022-12-26 MED ORDER — TRAMADOL HCL 50 MG PO TABS: 50.0000 mg | ORAL_TABLET | Freq: Three times a day (TID) | ORAL | 0 refills | Status: DC | PRN

## 2022-12-26 NOTE — Assessment & Plan Note (Signed)
Doing well with good control of BP and no angina symptoms She is walking and kayaking regularly - denies chest pains or shortness of breath. Will continue statin, Zetia and aspirin.

## 2022-12-26 NOTE — Progress Notes (Signed)
Date:  12/26/2022   Name:  [name]   DOB:  04/29/1950   MRN:  [id number]   Chief Complaint: Annual Exam [name] is a 72 y.o. female who presents today for her Complete Annual Exam. She feels well. She reports exercising - none. She reports she is sleeping well. Breast complaints - none.  Mammogram: scheduled 01/16/23 DEXA: 12/2020 Normal Colonoscopy: 09/2013 repeat 10 yrs  Health Maintenance Due  Topic Date Due   COVID-19 Vaccine (6 - 2023-24 season) 11/16/2022   MAMMOGRAM  12/26/2022    Immunization History  Administered Date(s) Administered   Fluad Quad(high Dose 65+) 11/23/2020, 11/25/2021   Fluad Trivalent(High Dose 65+) 12/26/2022   Influenza-Unspecified 12/18/2014, 11/26/2017, 01/24/2019   Moderna Covid-19 Vaccine Bivalent Booster 37yrs & up 01/02/2021   PFIZER(Purple Top)SARS-COV-2 Vaccination 03/08/2019, 03/29/2019, 12/30/2019   Pfizer(Comirnaty)Fall Seasonal Vaccine 12 years and older 02/14/2022   Pneumococcal Conjugate-13 11/17/2018   Pneumococcal Polysaccharide-23 11/22/2019   Respiratory Syncytial Virus Vaccine,Recomb Aduvanted(Arexvy) 02/14/2022   Tdap 07/16/2017   Zoster Recombinant(Shingrix) 01/24/2019, 04/18/2019   Zoster, Live 03/19/2011     Hypertension This is a chronic problem. The problem is controlled. Pertinent negatives include no chest pain, headaches or shortness of breath. Past treatments include beta blockers and diuretics. The current treatment provides significant improvement. There is no history of kidney disease, CAD/MI or CVA.  Hyperlipidemia This is a chronic problem. The problem is controlled. Pertinent negatives include no chest pain, focal weakness, myalgias or shortness of breath. Current antihyperlipidemic treatment includes statins and ezetimibe.  Hip Pain  There was no injury mechanism. The pain is present in the right hip. The quality of the pain is described as aching. The pain is mild. The pain has been Fluctuating  since onset.    Review of Systems  Constitutional:  Negative for chills and fever.  Respiratory:  Negative for shortness of breath.   Cardiovascular:  Negative for chest pain.  Gastrointestinal:  Negative for abdominal pain, constipation and diarrhea.  Genitourinary:  Negative for dysuria and urgency.  Musculoskeletal:  Positive for arthralgias and back pain. Negative for myalgias.  Neurological:  Negative for dizziness, focal weakness, light-headedness and headaches.  Psychiatric/Behavioral:  Negative for dysphoric mood and sleep disturbance. The patient is not nervous/anxious.      Lab Results  Component Value Date   NA 140 11/25/2021   K 3.6 11/25/2021   CO2 27 11/25/2021   GLUCOSE 102 (H) 11/25/2021   BUN 14 11/25/2021   CREATININE 0.78 11/25/2021   CALCIUM 9.4 11/25/2021   EGFR 83 11/25/2021   GFRNONAA >60 06/06/2020   Lab Results  Component Value Date   CHOL 169 11/25/2021   HDL 57 11/25/2021   LDLCALC 79 11/25/2021   TRIG 199 (H) 11/25/2021   CHOLHDL 3.0 11/25/2021   Lab Results  Component Value Date   TSH 3.190 11/25/2021   No results found for: "HGBA1C" Lab Results  Component Value Date   WBC 7.5 11/25/2021   HGB 13.7 11/25/2021   HCT 41.8 11/25/2021   MCV 91 11/25/2021   PLT 324 11/25/2021   Lab Results  Component Value Date   ALT 18 11/25/2021   AST 25 11/25/2021   ALKPHOS 72 11/25/2021   BILITOT 0.3 11/25/2021   No results found for: "25OHVITD2", "25OHVITD3", "VD25OH"   Patient Active Problem List   Diagnosis Date Noted   Bursitis of right hip 12/26/2022   Essential hypertension 03/26/2022   Coronary artery disease involving native  coronary artery of native heart without angina pectoris 11/01/2019   Gastroesophageal reflux disease 11/17/2018   Acquired absence of breast 02/09/2018   Breast asymmetry following reconstructive surgery 02/09/2018   Hot flashes 05/28/2016   Environmental and seasonal allergies 12/18/2014   History of carcinoma  in situ of vulva 12/18/2014   Alopecia 12/18/2014   Dependent edema 12/18/2014   History of hepatitis B virus infection 12/18/2014   Hx of breast cancer 12/18/2014   Hyperlipidemia, mild 12/18/2014    Allergies  Allergen Reactions   Other Rash   Misc. Sulfonamide Containing Compounds Rash   Penicillins Rash    Has patient had a PCN reaction causing immediate rash, facial/tongue/throat swelling, SOB or lightheadedness with hypotension: No Has patient had a PCN reaction causing severe rash involving mucus membranes or skin necrosis: No Has patient had a PCN reaction that required hospitalization: No Has patient had a PCN reaction occurring within the last 10 years: No If all of the above answers are "NO", then may proceed with Cephalosporin use.'   Sulfa Antibiotics Rash    Past Surgical History:  Procedure Laterality Date   ABDOMINAL HYSTERECTOMY  1997   AUGMENTATION MAMMAPLASTY Right 02/2015   Silicone   BREAST BIOPSY Right    Positive   BREAST EXCISIONAL BIOPSY Left 02/17/2018   Removed fat necrosis    BREAST REDUCTION SURGERY Left 01/18/2015   Procedure: MAMMARY REDUCTION  (BREAST);  Surgeon: Caryl Asp, MD;  Location: [facility];  Service: Plastics;  Laterality: Left;   BREAST REDUCTION SURGERY Left 02/17/2018   Procedure: Left breast reduction with excision of fat necrosis-no liposuction needed;  Surgeon: Peggye Form, DO;  Location: [facility];  Service: Plastics;  Laterality: Left;   PLACEMENT OF BREAST IMPLANTS Right 02/17/2018   Procedure: PLACEMENT OF BREAST IMPLANTS;  Surgeon: Peggye Form, DO;  Location: [facility];  Service: Plastics;  Laterality: Right;   TISSUE EXPANDER PLACEMENT Right 01/18/2015   Procedure: Right  BREAST EXPANDER PLACEMENT WITH ALLODERM;  Surgeon: Caryl Asp, MD;  Location: [facility];  Service: Plastics;  Laterality: Right;  DR Meriam Sprague RM 2 PER BRENDA   TISSUE EXPANDER PLACEMENT Right 03/13/2015   Procedure: Right  breast tissue expander removal and Sillicone implant placement.;  Surgeon: Caryl Asp, MD;  Location: [facility];  Service: Plastics;  Laterality: Right;   TOTAL MASTECTOMY Right 02/2013   stage 1 ER/PR+   VULVECTOMY      Social History   Tobacco Use   Smoking status: Never   Smokeless tobacco: Never  Vaping Use   Vaping status: Never Used  Substance Use Topics   Alcohol use: Yes    Comment: 3-5 glasses of wine weekly   Drug use: No     Medication list has been reviewed and updated.  Current Meds  Medication Sig   aspirin EC 81 MG tablet Take 1 tablet (81 mg total) by mouth daily. Swallow whole.   Calcium Carbonate-Vitamin D (CALCIUM + D PO) Take 1 tablet by mouth 2 (two) times daily.    carvedilol (COREG) 6.25 MG tablet Take 1 tablet (6.25 mg total) by mouth 2 (two) times daily.   ELDERBERRY PO Take by mouth daily. gummies   esomeprazole (NEXIUM) 20 MG capsule TAKE 1 CAPSULE BY MOUTH DAILY AT NOON   ezetimibe (ZETIA) 10 MG tablet Take 1 tablet (10 mg total) by mouth daily.   fexofenadine (ALLEGRA) 180 MG tablet Take 180 mg by mouth daily as needed for allergies.  hydrochlorothiazide (HYDRODIURIL) 25 MG tablet TAKE 1 TABLET BY MOUTH EVERY DAY   hydrocortisone (ANUSOL-HC) 2.5 % rectal cream Place 1 Application rectally 2 (two) times daily.   Magnesium 500 MG TABS Take 500 mg by mouth daily.    rosuvastatin (CRESTOR) 20 MG tablet Take 1 tablet (20 mg total) by mouth daily.   triamcinolone (NASACORT) 55 MCG/ACT AERO nasal inhaler Place 2 sprays into the nose daily as needed (allergies).    Turmeric 500 MG CAPS Take by mouth. Taking 2 caps daily       12/26/2022   11:35 AM 03/26/2022    9:10 AM 11/25/2021   10:08 AM 11/23/2020   10:03 AM  GAD 7 : Generalized Anxiety Score  Nervous, Anxious, on Edge 0 0 1 0  Control/stop worrying 0 0 1 0  Worry too much - different things 0 0 1 1  Trouble relaxing 0 0 1 1  Restless 0 0 1 0  Easily annoyed or irritable 0 0 0 0   Afraid - awful might happen 0 0 0 0  Total GAD 7 Score 0 0 5 2  Anxiety Difficulty Not difficult at all Not difficult at all Not difficult at all Not difficult at all       12/26/2022   11:35 AM 06/18/2022    8:20 AM 03/26/2022    9:10 AM  Depression screen PHQ 2/9  Decreased Interest 0 0 0  Down, Depressed, Hopeless 0 0 0  PHQ - 2 Score 0 0 0  Altered sleeping 0 0 0  Tired, decreased energy 0 0 0  Change in appetite 0 0 0  Feeling bad or failure about yourself  0 0 0  Trouble concentrating 0 0 0  Moving slowly or fidgety/restless 0 0 0  Suicidal thoughts 0 0 0  PHQ-9 Score 0 0 0  Difficult doing work/chores Not difficult at all Not difficult at all Not difficult at all    BP Readings from Last 3 Encounters:  12/26/22 126/78  11/27/22 124/80  03/26/22 118/78    Physical Exam Vitals and nursing note reviewed.  Constitutional:      General: She is not in acute distress.    Appearance: She is well-developed.  HENT:     Head: Normocephalic and atraumatic.     Right Ear: Tympanic membrane and ear canal normal.     Left Ear: Tympanic membrane and ear canal normal.     Nose:     Right Sinus: No maxillary sinus tenderness.     Left Sinus: No maxillary sinus tenderness.  Eyes:     General: No scleral icterus.       Right eye: No discharge.        Left eye: No discharge.     Conjunctiva/sclera: Conjunctivae normal.  Neck:     Thyroid: No thyromegaly.     Vascular: No carotid bruit.  Cardiovascular:     Rate and Rhythm: Normal rate and regular rhythm. No extrasystoles are present.    Pulses:          Dorsalis pedis pulses are 2+ on the right side and 2+ on the left side.       Posterior tibial pulses are 1+ on the right side and 1+ on the left side.     Heart sounds: Normal heart sounds.  Pulmonary:     Effort: Pulmonary effort is normal. No respiratory distress.     Breath sounds: No wheezing.  Abdominal:  General: Bowel sounds are normal.     Palpations: Abdomen  is soft.     Tenderness: There is no abdominal tenderness.  Musculoskeletal:     Cervical back: Normal range of motion. No erythema.     Right hip: No tenderness. Normal range of motion.     Left hip: No tenderness. Normal range of motion.     Right lower leg: No edema.     Left lower leg: No edema.  Lymphadenopathy:     Cervical: No cervical adenopathy.  Skin:    General: Skin is warm and dry.     Findings: No rash.  Neurological:     Mental Status: She is alert and oriented to person, place, and time.     Cranial Nerves: No cranial nerve deficit.     Sensory: No sensory deficit.     Deep Tendon Reflexes: Reflexes are normal and symmetric.  Psychiatric:        Attention and Perception: Attention normal.        Mood and Affect: Mood normal.     Wt Readings from Last 3 Encounters:  12/26/22 168 lb (76.2 kg)  11/27/22 171 lb (77.6 kg)  06/18/22 169 lb (76.7 kg)    BP 126/78 (BP Location: Left Arm, Cuff Size: Normal)   Pulse 76   Ht 5' 5.5" (1.664 m)   Wt 168 lb (76.2 kg)   SpO2 98%   BMI 27.53 kg/m   Assessment and Plan:  Problem List Items Addressed This Visit       Unprioritized   Bursitis of right hip    Seen by [facility] - s/p hip injection and PT Some improvement - uses Mobic prn, muscle relaxants at night Tramadol PRN severe pain      Relevant Medications   traMADol (ULTRAM) 50 MG tablet   Coronary artery disease involving native coronary artery of native heart without angina pectoris (Chronic)    Doing well with good control of BP and no angina symptoms She is walking and kayaking regularly - denies chest pains or shortness of breath. Will continue statin, Zetia and aspirin.      Essential hypertension (Chronic)    Normal exam with stable BP on Coreg and hctz. No concerns or side effects to current medication. No change in regimen; continue low sodium diet.       Relevant Orders   CBC with Differential/Platelet   Comprehensive metabolic panel    TSH   Urinalysis, Routine w reflex microscopic   Hyperlipidemia, mild (Chronic)    LDL is  Lab Results  Component Value Date   LDLCALC 79 11/25/2021  Currently being treated with Zetia and Crestor with good compliance and no concerns.       Relevant Orders   Lipid panel   Other Visit Diagnoses     Annual physical exam    -  Primary   Encounter for screening mammogram for breast cancer       scheduled for November 1st   Colon cancer screening       repeat colonoscopy due next year   Elevated serum glucose       Relevant Orders   Hemoglobin A1c   Need for influenza vaccination       Relevant Orders   Flu Vaccine Trivalent High Dose (Fluad) (Completed)       Return in about 1 year (around 12/26/2023) for CPX.    Reubin Milan, MD [facility]  [place]

## 2022-12-26 NOTE — Assessment & Plan Note (Signed)
Seen by [facility] - s/p hip injection and PT Some improvement - uses Mobic prn, muscle relaxants at night Tramadol PRN severe pain

## 2022-12-26 NOTE — Assessment & Plan Note (Signed)
Normal exam with stable BP on Coreg and hctz. No concerns or side effects to current medication. No change in regimen; continue low sodium diet.

## 2022-12-26 NOTE — Assessment & Plan Note (Signed)
LDL is  Lab Results  Component Value Date   LDLCALC 79 11/25/2021  Currently being treated with Zetia and Crestor with good compliance and no concerns.

## 2022-12-27 LAB — COMPREHENSIVE METABOLIC PANEL
ALT: 21 [IU]/L (ref 0–32)
AST: 26 [IU]/L (ref 0–40)
Albumin: 4.2 g/dL (ref 3.9–4.9)
Alkaline Phosphatase: 73 [IU]/L (ref 44–121)
BUN/Creatinine Ratio: 16 (ref 12–28)
BUN: 15 mg/dL (ref 8–27)
Bilirubin Total: 0.2 mg/dL (ref 0.0–1.2)
CO2: 29 mmol/L (ref 20–29)
Calcium: 9.9 mg/dL (ref 8.7–10.3)
Chloride: 99 mmol/L (ref 96–106)
Creatinine, Ser: 0.93 mg/dL (ref 0.57–1.00)
Globulin, Total: 2.8 g/dL (ref 1.5–4.5)
Glucose: 98 mg/dL (ref 70–99)
Potassium: 4.1 mmol/L (ref 3.5–5.2)
Sodium: 143 mmol/L (ref 134–144)
Total Protein: 7 g/dL (ref 6.0–8.5)
eGFR: 67 mL/min/{1.73_m2} (ref >59)

## 2022-12-27 LAB — CBC WITH DIFFERENTIAL/PLATELET
Basophils Absolute: 0.1 10*3/uL (ref 0.0–0.2)
Basos: 1 %
EOS (ABSOLUTE): 0.4 10*3/uL (ref 0.0–0.4)
Eos: 6 %
Hematocrit: 46.4 % (ref 34.0–46.6)
Hemoglobin: 14.9 g/dL (ref 11.1–15.9)
Immature Grans (Abs): 0 10*3/uL (ref 0.0–0.1)
Immature Granulocytes: 0 %
Lymphocytes Absolute: 1.6 10*3/uL (ref 0.7–3.1)
Lymphs: 23 %
MCH: 30.8 pg (ref 26.6–33.0)
MCHC: 32.1 g/dL (ref 31.5–35.7)
MCV: 96 fL (ref 79–97)
Monocytes Absolute: 0.9 10*3/uL (ref 0.1–0.9)
Monocytes: 12 %
Neutrophils Absolute: 4 10*3/uL (ref 1.4–7.0)
Neutrophils: 58 %
Platelets: 314 10*3/uL (ref 150–450)
RBC: 4.84 x10E6/uL (ref 3.77–5.28)
RDW: 13.3 % (ref 11.7–15.4)
WBC: 6.9 10*3/uL (ref 3.4–10.8)

## 2022-12-27 LAB — URINALYSIS, ROUTINE W REFLEX MICROSCOPIC
Bilirubin, UA: NEGATIVE
Glucose, UA: NEGATIVE
Leukocytes,UA: NEGATIVE
Nitrite, UA: NEGATIVE
RBC, UA: NEGATIVE
Specific Gravity, UA: 1.024 (ref 1.005–1.030)
Urobilinogen, Ur: 0.2 mg/dL (ref 0.2–1.0)
pH, UA: 6.5 (ref 5.0–7.5)

## 2022-12-27 LAB — LIPID PANEL
Chol/HDL Ratio: 2.7 {ratio} (ref 0.0–4.4)
Cholesterol, Total: 166 mg/dL (ref 100–199)
HDL: 61 mg/dL (ref >39)
LDL Chol Calc (NIH): 81 mg/dL (ref 0–99)
Triglycerides: 140 mg/dL (ref 0–149)
VLDL Cholesterol Cal: 24 mg/dL (ref 5–40)

## 2022-12-27 LAB — HEMOGLOBIN A1C
Est. average glucose Bld gHb Est-mCnc: 126 mg/dL
Hgb A1c MFr Bld: 6 % — ABNORMAL HIGH (ref 4.8–5.6)

## 2022-12-27 LAB — TSH: TSH: 2.53 u[IU]/mL (ref 0.450–4.500)

## 2023-01-28 ENCOUNTER — Ambulatory Visit
Admission: RE | Admit: 2023-01-28 | Discharge: 2023-01-28 | Disposition: A | Discharge status: Home / Self Care | Payer: Medicare HMO | Source: Ambulatory Visit | Attending: Internal Medicine | Admitting: Internal Medicine

## 2023-01-28 DIAGNOSIS — Z1231 Encounter for screening mammogram for malignant neoplasm of breast: Secondary | ICD-10-CM | POA: Diagnosis present

## 2023-04-22 ENCOUNTER — Ambulatory Visit: Payer: Medicare HMO

## 2023-04-22 DIAGNOSIS — Z0000 Encounter for general adult medical examination without abnormal findings: Secondary | ICD-10-CM

## 2023-04-22 NOTE — Patient Instructions (Addendum)
 Ms. [name] , Thank you for taking time to come for your Medicare Wellness Visit. I appreciate your ongoing commitment to your health goals. Please review the following plan we discussed and let me know if I can assist you in the future.   Referrals/Orders/Follow-Ups/Clinician Recommendations: NONE  This is a list of the screening recommended for you and due dates:  Health Maintenance  Topic Date Due   Colon Cancer Screening  09/24/2023   Mammogram  01/28/2024   Medicare Annual Wellness Visit  04/21/2024   DTaP/Tdap/Td vaccine (2 - Td or Tdap) 07/17/2027   Pneumonia Vaccine  Completed   Flu Shot  Completed   DEXA scan (bone density measurement)  Completed   COVID-19 Vaccine  Completed   Hepatitis C Screening  Completed   Zoster (Shingles) Vaccine  Completed   HPV Vaccine  Aged Out    Advanced directives: (ACP Link)Information on Advanced Care Planning can be found at [place]  Secretary of State Advance Health Care Directives Advance Health Care Directives ([email or web address]   Next Medicare Annual Wellness Visit scheduled for next year: Yes   04/27/24 @ 10:10 AM BY VIDEO

## 2023-04-22 NOTE — Progress Notes (Signed)
 Subjective:   [name] is a 73 y.o. female who presents for Medicare Annual (Subsequent) preventive examination.  Visit Complete: Virtual I connected with  [name] on 04/22/23 by a video and audio enabled telemedicine application and verified that I am speaking with the correct person using two identifiers.  Patient Location: Home  Provider Location: Office/Clinic  I discussed the limitations of evaluation and management by telemedicine. The patient expressed understanding and agreed to proceed.  Vital Signs: Because this visit was a virtual/telehealth visit, some criteria may be missing or patient reported. Any vitals not documented were not able to be obtained and vitals that have been documented are patient reported.  Cardiac Risk Factors include: advanced age (>63men, >14 women);dyslipidemia;hypertension     Objective:    There were no vitals filed for this visit. There is no height or weight on file to calculate BMI.     04/22/2023   10:49 AM 06/18/2022    8:22 AM 06/10/2021    9:24 AM 04/01/2018   10:26 AM 02/17/2018    6:25 AM 07/22/2017   10:52 AM 12/03/2016   10:57 AM  Advanced Directives  Does Patient Have a Medical Advance Directive? No No No No No No No  Would patient like information on creating a medical advance directive? No - Patient declined No - Patient declined No - Patient declined No - Patient declined No - Patient declined  No - Patient declined    Current Medications (verified) Outpatient Encounter Medications as of 04/22/2023  Medication Sig   aspirin  EC 81 MG tablet Take 1 tablet (81 mg total) by mouth daily. Swallow whole.   Calcium  Carbonate-Vitamin D (CALCIUM  + D PO) Take 1 tablet by mouth 2 (two) times daily.    carvedilol  (COREG ) 6.25 MG tablet Take 1 tablet (6.25 mg total) by mouth 2 (two) times daily.   ELDERBERRY PO Take by mouth daily. gummies   esomeprazole  (NEXIUM ) 20 MG capsule TAKE 1 CAPSULE BY MOUTH DAILY AT NOON   ezetimibe   (ZETIA ) 10 MG tablet Take 1 tablet (10 mg total) by mouth daily.   fexofenadine (ALLEGRA) 180 MG tablet Take 180 mg by mouth daily as needed for allergies.    hydrochlorothiazide  (HYDRODIURIL ) 25 MG tablet TAKE 1 TABLET BY MOUTH EVERY DAY   hydrocortisone  (ANUSOL -HC) 2.5 % rectal cream Place 1 Application rectally 2 (two) times daily.   Magnesium  500 MG TABS Take 500 mg by mouth daily.    rosuvastatin  (CRESTOR ) 20 MG tablet Take 1 tablet (20 mg total) by mouth daily.   triamcinolone (NASACORT) 55 MCG/ACT AERO nasal inhaler Place 2 sprays into the nose daily as needed (allergies).    Turmeric 500 MG CAPS Take by mouth. Taking 2 caps daily   No facility-administered encounter medications on file as of 04/22/2023.    Allergies (verified) Other, Misc. sulfonamide containing compounds, Penicillins, and Sulfa antibiotics   History: Past Medical History:  Diagnosis Date   Arthritis    Breast cancer (HCC) 02/2014   Cancer (HCC)    Vulvular   GERD (gastroesophageal reflux disease)    Hepatitis    Hep B in 1981   PONV (postoperative nausea and vomiting)    pt states scopalamine patch helped and zofran  and phenergan  helped   Past Surgical History:  Procedure Laterality Date   ABDOMINAL HYSTERECTOMY  1997   AUGMENTATION MAMMAPLASTY Right 02/2015   Silicone   BREAST BIOPSY Right    Positive   BREAST EXCISIONAL BIOPSY  Left 02/17/2018   Removed fat necrosis    BREAST REDUCTION SURGERY Left 01/18/2015   Procedure: MAMMARY REDUCTION  (BREAST);  Surgeon: Redell Nettle, MD;  Location: [facility];  Service: Plastics;  Laterality: Left;   BREAST REDUCTION SURGERY Left 02/17/2018   Procedure: Left breast reduction with excision of fat necrosis-no liposuction needed;  Surgeon: Lowery Estefana RAMAN, DO;  Location: [facility];  Service: Plastics;  Laterality: Left;   PLACEMENT OF BREAST IMPLANTS Right 02/17/2018   Procedure: PLACEMENT OF BREAST IMPLANTS;  Surgeon: Lowery Estefana RAMAN, DO;   Location: [facility];  Service: Plastics;  Laterality: Right;   TISSUE EXPANDER PLACEMENT Right 01/18/2015   Procedure: Right  BREAST EXPANDER PLACEMENT WITH ALLODERM;  Surgeon: Redell Nettle, MD;  Location: [facility];  Service: Plastics;  Laterality: Right;  DR NETTLE RM 2 PER BRENDA   TISSUE EXPANDER PLACEMENT Right 03/13/2015   Procedure: Right breast tissue expander removal and Sillicone implant placement.;  Surgeon: Redell Nettle, MD;  Location: [facility];  Service: Plastics;  Laterality: Right;   TOTAL MASTECTOMY Right 02/2013   stage 1 ER/PR+   VULVECTOMY     Family History  Problem Relation Age of Onset   Hypertension Father    Diabetes Father    Stroke Father    Diabetes Brother    Hypertension Mother    Thyroid  disease Mother    Leukemia Maternal Grandmother    Breast cancer Paternal Grandmother    Breast cancer Cousin    Social History   Socioeconomic History   Marital status: Married    Spouse name: Not on file   Number of children: 2   Years of education: Not on file   Highest education level: Bachelor's degree (e.g., BA, AB, BS)  Occupational History   Not on file  Tobacco Use   Smoking status: Never   Smokeless tobacco: Never  Vaping Use   Vaping status: Never Used  Substance and Sexual Activity   Alcohol use: Yes    Comment: 3-5 glasses of wine weekly   Drug use: No   Sexual activity: Yes  Other Topics Concern   Not on file  Social History Narrative   Not on file   Social Drivers of Health   Financial Resource Strain: Low Risk  (04/22/2023)   Overall Financial Resource Strain (CARDIA)    Difficulty of Paying Living Expenses: Not hard at all  Food Insecurity: No Food Insecurity (04/22/2023)   Hunger Vital Sign    Worried About Running Out of Food in the Last Year: Never true    Ran Out of Food in the Last Year: Never true  Transportation Needs: No Transportation Needs (04/22/2023)   PRAPARE - Administrator, Civil Service  (Medical): No    Lack of Transportation (Non-Medical): No  Physical Activity: Sufficiently Active (04/22/2023)   Exercise Vital Sign    Days of Exercise per Week: 3 days    Minutes of Exercise per Session: 50 min  Stress: No Stress Concern Present (04/22/2023)   [employer] of Occupational Health - Occupational Stress Questionnaire    Feeling of Stress : Not at all  Social Connections: Socially Integrated (04/22/2023)   Social Connection and Isolation Panel [NHANES]    Frequency of Communication with Friends and Family: More than three times a week    Frequency of Social Gatherings with Friends and Family: Once a week    Attends Religious Services: More than 4 times per year  Active Member of Clubs or Organizations: Yes    Attends Engineer, Structural: More than 4 times per year    Marital Status: Married    Tobacco Counseling Counseling given: Not Answered   Clinical Intake:  Pre-visit preparation completed: Yes  Pain : No/denies pain     BMI - recorded: 27.5 Nutritional Status: BMI 25 -29 Overweight Nutritional Risks: None Diabetes: No  How often do you need to have someone help you when you read instructions, pamphlets, or other written materials from your doctor or pharmacy?: 1 - Never  Interpreter Needed?: No  Information entered by :: JHONNIE DAS, LPN   Activities of Daily Living    04/22/2023   10:50 AM 04/18/2023    9:55 AM  In your present state of health, do you have any difficulty performing the following activities:  Hearing? 0 0  Vision? 1 1  Difficulty concentrating or making decisions? 0 0  Walking or climbing stairs? 0 0  Dressing or bathing? 0 0  Doing errands, shopping? 0 0  Preparing Food and eating ? N N  Using the Toilet? N N  In the past six months, have you accidently leaked urine? N N  Do you have problems with loss of bowel control? N N  Managing your Medications? N N  Managing your Finances? N N  Housekeeping or managing  your Housekeeping? N N    Patient Care Team: Justus Leita DEL, MD as PCP - General (Internal Medicine) Darron Deatrice LABOR, MD as Consulting Physician (Cardiology) Jackquline Sawyer, MD (Dermatology) Dillingham, Estefana RAMAN, DO as Attending Physician (Plastic Surgery)  Indicate any recent Medical Services you may have received from other than [facility] providers in the past year (date may be approximate).     Assessment:   This is a routine wellness examination for Yadhira.  Hearing/Vision screen Hearing Screening - Comments:: NO AIDS Vision Screening - Comments:: WEARS GLASSES ALL THE TIME- DR.JOHNSON IN [place]   Goals Addressed             This Visit's Progress    DIET - INCREASE WATER INTAKE         Depression Screen    04/22/2023   10:47 AM 12/26/2022   11:35 AM 06/18/2022    8:20 AM 03/26/2022    9:10 AM 11/25/2021   10:08 AM 06/10/2021    9:22 AM 11/23/2020   10:03 AM  PHQ 2/9 Scores  PHQ - 2 Score 0 0 0 0 0 0 0  PHQ- 9 Score 0 0 0 0 2  2    Fall Risk    04/22/2023   10:49 AM 04/18/2023    9:55 AM 12/26/2022   11:35 AM 06/18/2022    8:22 AM 06/14/2022    8:51 AM  Fall Risk   Falls in the past year? 1 1 0 0 0  Number falls in past yr: 0 0 0 0   Injury with Fall? 1 1 0 0   Risk for fall due to : History of fall(s)  No Fall Risks No Fall Risks   Follow up Falls evaluation completed;Falls prevention discussed  Falls evaluation completed Falls prevention discussed;Falls evaluation completed     MEDICARE RISK AT HOME: Medicare Risk at Home Any stairs in or around the home?: Yes If so, are there any without handrails?: No Home free of loose throw rugs in walkways, pet beds, electrical cords, etc?: Yes Adequate lighting in your home to reduce risk of falls?: Yes  Life alert?: No Use of a cane, walker or w/c?: No Grab bars in the bathroom?: No Shower chair or bench in shower?: Yes Elevated toilet seat or a handicapped toilet?: No  TIMED UP AND GO:  Was the test performed?  No     Cognitive Function:        04/22/2023   10:51 AM 06/18/2022    8:26 AM  6CIT Screen  What Year? 0 points 0 points  What month? 0 points 0 points  What time? 0 points 0 points  Count back from 20 0 points 0 points  Months in reverse 0 points 0 points  Repeat phrase 0 points 0 points  Total Score 0 points 0 points    Immunizations Immunization History  Administered Date(s) Administered   Fluad Quad(high Dose 65+) 11/23/2020, 11/25/2021   Fluad Trivalent(High Dose 65+) 12/26/2022   Influenza-Unspecified 12/18/2014, 11/26/2017, 01/24/2019   Moderna Covid-19 Vaccine Bivalent Booster 110yrs & up 01/02/2021   PFIZER(Purple Top)SARS-COV-2 Vaccination 03/08/2019, 03/29/2019, 12/30/2019   Pfizer(Comirnaty)Fall Seasonal Vaccine 12 years and older 02/14/2022, 01/14/2023   Pneumococcal Conjugate-13 11/17/2018   Pneumococcal Polysaccharide-23 11/22/2019   Respiratory Syncytial Virus Vaccine,Recomb Aduvanted(Arexvy) 02/14/2022   Tdap 07/16/2017   Zoster Recombinant(Shingrix ) 01/24/2019, 04/18/2019   Zoster, Live 03/19/2011    TDAP status: Up to date  Flu Vaccine status: Up to date  Pneumococcal vaccine status: Up to date  Covid-19 vaccine status: Completed vaccines  Qualifies for Shingles Vaccine? Yes   Zostavax completed Yes   Shingrix  Completed?: Yes  Screening Tests Health Maintenance  Topic Date Due   Colonoscopy  09/24/2023   MAMMOGRAM  01/28/2024   Medicare Annual Wellness (AWV)  04/21/2024   DTaP/Tdap/Td (2 - Td or Tdap) 07/17/2027   Pneumonia Vaccine 33+ Years old  Completed   INFLUENZA VACCINE  Completed   DEXA SCAN  Completed   COVID-19 Vaccine  Completed   Hepatitis C Screening  Completed   Zoster Vaccines- Shingrix   Completed   HPV VACCINES  Aged Out    Health Maintenance  There are no preventive care reminders to display for this patient.   Colorectal cancer screening: Type of screening: Colonoscopy. Completed 09/24/23. Repeat every 10 years  Mammogram  status: Completed 01/28/23. Repeat every year  Bone Density status: Completed 12/18/20. Results reflect: Bone density results: NORMAL. Repeat every 5 years.  Lung Cancer Screening: (Low Dose CT Chest recommended if Age 67-80 years, 20 pack-year currently smoking OR have quit w/in 15years.) does not qualify.    Additional Screening:  Hepatitis C Screening: does qualify; Completed 07/16/17  Vision Screening: Recommended annual ophthalmology exams for early detection of glaucoma and other disorders of the eye. Is the patient up to date with their annual eye exam?  Yes  Who is the provider or what is the name of the office in which the patient attends annual eye exams? DR.JOHNSON IN [place] If pt is not established with a provider, would they like to be referred to a provider to establish care? No .   Dental Screening: Recommended annual dental exams for proper oral hygiene   Community Resource Referral / Chronic Care Management: CRR required this visit?  No   CCM required this visit?  No     Plan:     I have personally reviewed and noted the following in the patient's chart:   Medical and social history Use of alcohol, tobacco or illicit drugs  Current medications and supplements including opioid prescriptions. Patient is not currently taking opioid  prescriptions. Functional ability and status Nutritional status Physical activity Advanced directives List of other physicians Hospitalizations, surgeries, and ER visits in previous 12 months Vitals Screenings to include cognitive, depression, and falls Referrals and appointments  In addition, I have reviewed and discussed with patient certain preventive protocols, quality metrics, and best practice recommendations. A written personalized care plan for preventive services as well as general preventive health recommendations were provided to patient.     Jhonnie GORMAN Das, LPN   09/17/7972   After Visit Summary: (MyChart) Due to  this being a telephonic visit, the after visit summary with patients personalized plan was offered to patient via MyChart   Nurse Notes: NONE

## 2023-05-16 HISTORY — PX: CATARACT EXTRACTION PHACO AND INTRAOCULAR LENS PLACEMENT W/ CORTICOSTEROID: SHX7020

## 2023-05-27 ENCOUNTER — Telehealth: Payer: Self-pay | Admitting: Cardiovascular Disease

## 2023-05-27 NOTE — Telephone Encounter (Signed)
 Caller Joyce Gross Overby, [facility], Orson Aloe) stated she will be faxing urgent request for patient information to be faxed to fax# [phone].

## 2023-07-07 ENCOUNTER — Ambulatory Visit: Payer: Medicare HMO | Admitting: Plastic Surgery

## 2023-08-04 ENCOUNTER — Ambulatory Visit: Admitting: Plastic Surgery

## 2023-09-22 IMAGING — MG MM DIGITAL DIAGNOSTIC UNILAT*L* W/ TOMO W/ CAD
8 series · 8 of 24 positions shown · non-contrast
Comparison: Previous exam(s).

CLINICAL DATA: 69-year-old female with history of right breast
mastectomy for cancer presenting for about 3 months of intermittent
focal pain in the left breast. She has had a reduction in the left
breast with a subsequent revisional surgery for scarring.

EXAM:
DIGITAL DIAGNOSTIC UNILATERAL LEFT MAMMOGRAM WITH TOMOSYNTHESIS AND
CAD; ULTRASOUND LEFT BREAST LIMITED
TECHNIQUE: Left digital diagnostic mammography and breast tomosynthesis was
performed. The images were evaluated with computer-aided detection.;
Targeted ultrasound examination of the left breast was performed.

[L MLO synth-2D (1 of 3)]
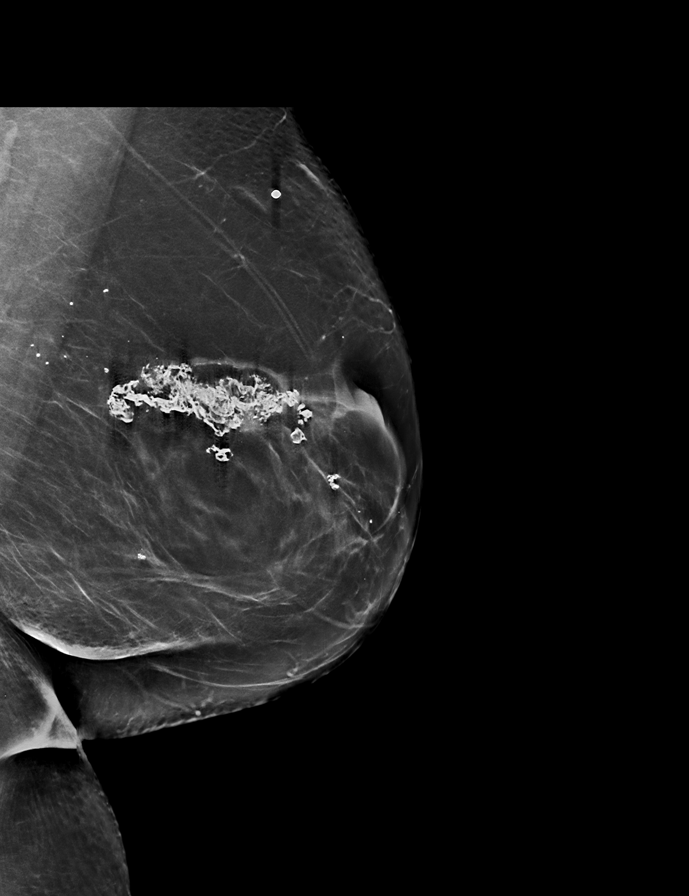

[L MLO synth-2D (2 of 3)]
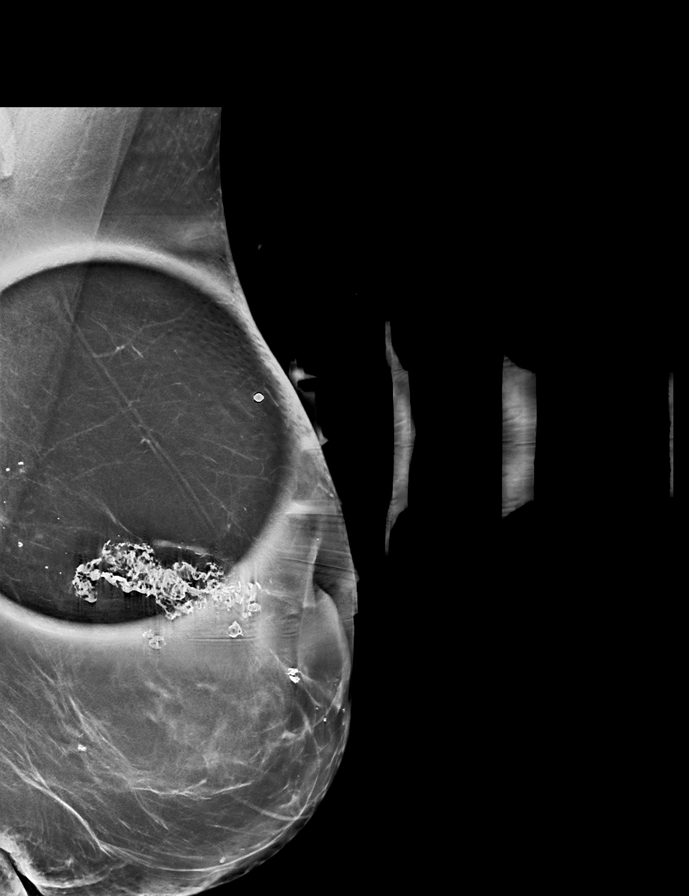

[L CC synth-2D]
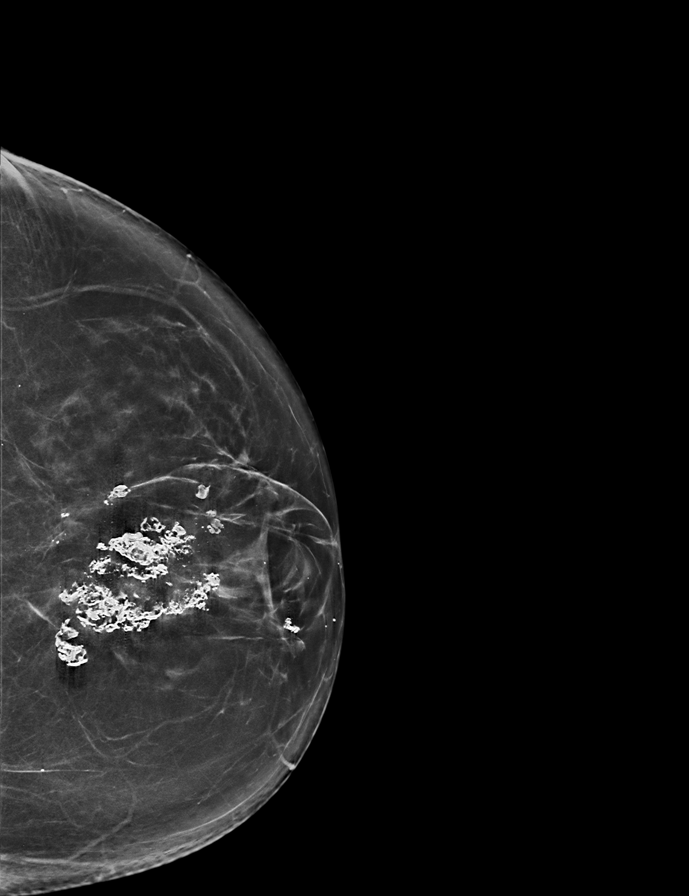

[L MLO synth-2D (3 of 3)]
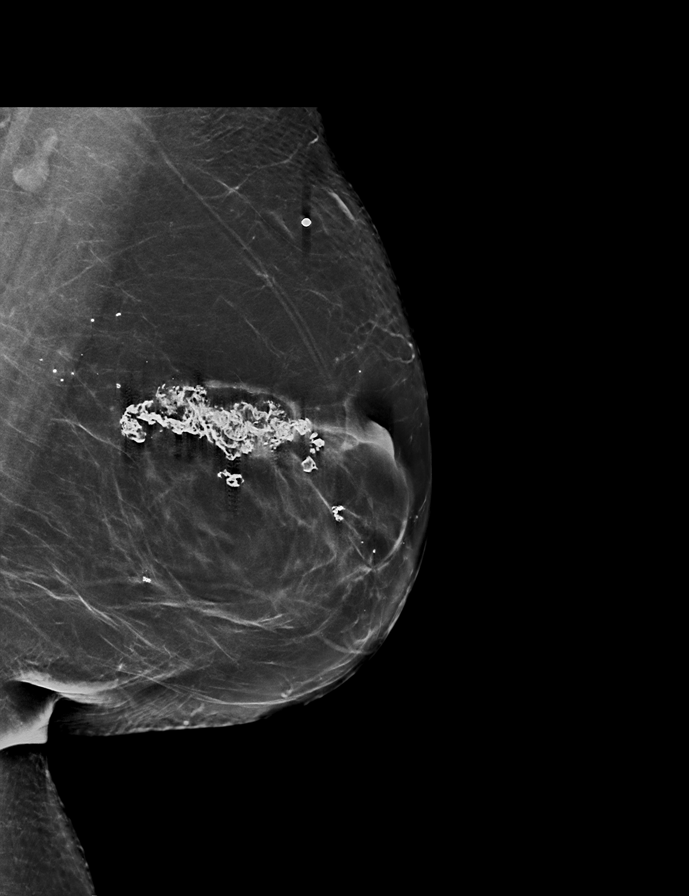

[L MLO tomo (1 of 3) · tomo slice 39/76.0]
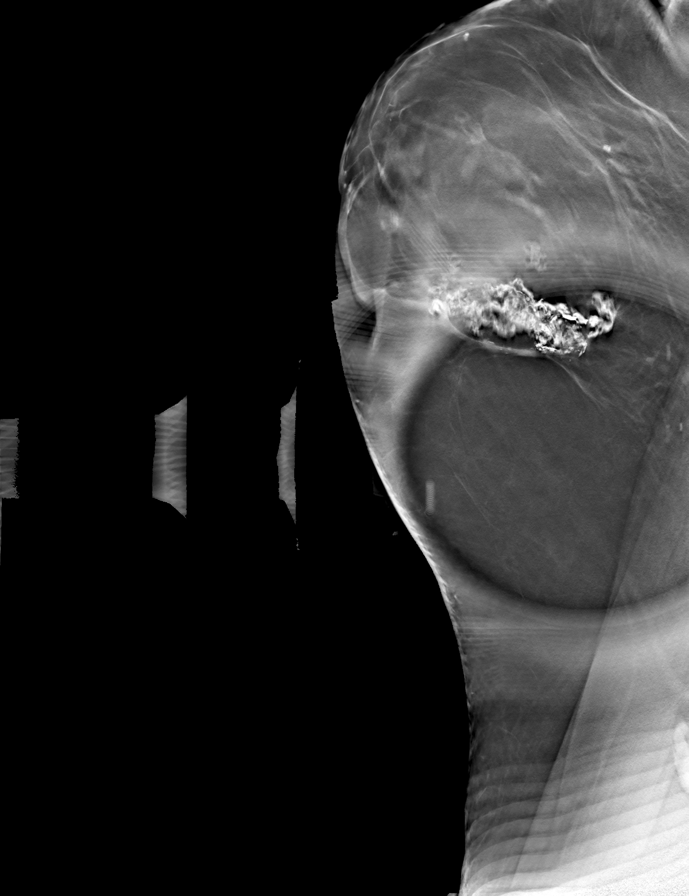

[L MLO tomo (2 of 3) · tomo slice 41/82.0]
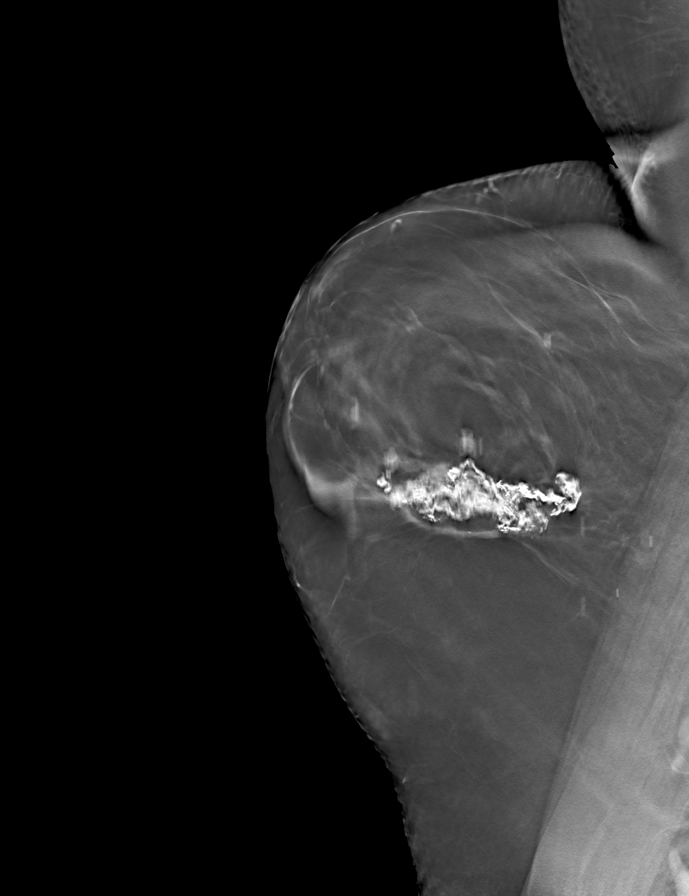

[L MLO tomo (3 of 3) · tomo slice 40/79.0]
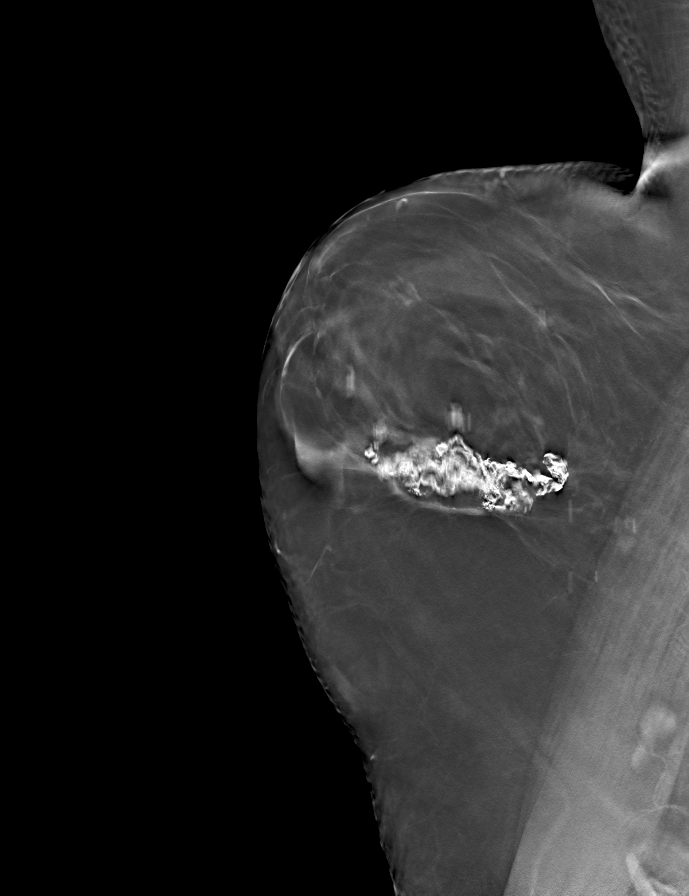

[L CC tomo · tomo slice 35/70.0]
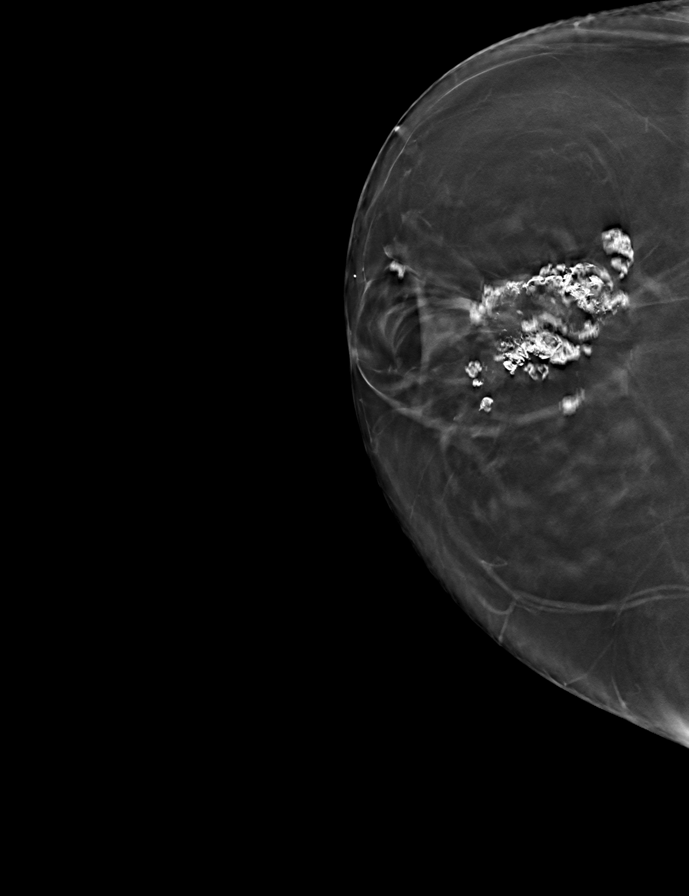

[8 of 24 positions shown; findings below may reference images not displayed]

ACR Breast Density Category b: There are scattered areas of
fibroglandular density.
FINDINGS: The patient has had a right breast mastectomy. A BB indicating the
site of pain has been placed along the superior aspect of the left
breast. There are no new suspicious findings found deep to this
marker. There is an area of tightly clustered coarse heterogeneous
calcifications spanning approximately 5 cm associated with an oil
cyst in the superior central left breast consistent with fat
necrosis. This is positioned deep to the palpable marker. No
suspicious calcifications, masses or areas of distortion are seen in
the left breast.

Ultrasound targeted to the left breast at 11-12:30 demonstrates
normal fibroglandular tissue. There is a shadowing oil cyst with
associated echogenic calcifications at 11 o'clock, 3 cm from the
nipple consistent with the patient's known fat necrosis.
IMPRESSION: 1. There are no suspicious findings at the site of focal pain in the
superior left breast.

2.  No mammographic evidence of malignancy in the left breast.

RECOMMENDATION:
1. Clinical follow-up recommended for the tender area of concern in
the left breast. Any further workup should be based on clinical
grounds.

2.  Screening mammogram in one year.(Code:[id number]-[id number])

I have discussed the findings and recommendations with the patient.
If applicable, a reminder letter will be sent to the patient
regarding the next appointment.

BI-RADS CATEGORY  2: Benign.

## 2023-12-10 ENCOUNTER — Other Ambulatory Visit: Payer: Self-pay | Admitting: Cardiovascular Disease

## 2023-12-10 ENCOUNTER — Ambulatory Visit: Attending: Cardiovascular Disease | Admitting: Cardiovascular Disease

## 2023-12-10 ENCOUNTER — Encounter: Payer: Self-pay | Admitting: Cardiovascular Disease

## 2023-12-10 VITALS — BP 120/88 | HR 72 | Ht 65.5 in | Wt 144.5 lb

## 2023-12-10 DIAGNOSIS — I10 Essential (primary) hypertension: Secondary | ICD-10-CM | POA: Diagnosis not present

## 2023-12-10 DIAGNOSIS — I2510 Atherosclerotic heart disease of native coronary artery without angina pectoris: Secondary | ICD-10-CM

## 2023-12-10 DIAGNOSIS — E785 Hyperlipidemia, unspecified: Secondary | ICD-10-CM | POA: Diagnosis not present

## 2023-12-10 MED ORDER — EZETIMIBE 10 MG PO TABS: 10.0000 mg | ORAL_TABLET | Freq: Every day | ORAL | 3 refills | Status: AC

## 2023-12-10 MED ORDER — HYDROCHLOROTHIAZIDE 25 MG PO TABS: 25.0000 mg | ORAL_TABLET | Freq: Every day | ORAL | 3 refills | Status: AC

## 2023-12-10 MED ORDER — CARVEDILOL 6.25 MG PO TABS: 6.2500 mg | ORAL_TABLET | Freq: Two times a day (BID) | ORAL | 3 refills | Status: AC

## 2023-12-10 MED ORDER — ROSUVASTATIN CALCIUM 20 MG PO TABS: 20.0000 mg | ORAL_TABLET | Freq: Every day | ORAL | 3 refills | Status: AC

## 2023-12-10 NOTE — Progress Notes (Signed)
 Cardiology Office Note   Date:  12/10/2023   ID:  [name], DOB 1951/03/03, MRN [id number]  PCP:  Justus Leita DEL, MD  Cardiologist:   Deatrice Cage, MD   Chief Complaint  Patient presents with   Follow-up    12 Month f/u no complaints today. Meds reviewed verbally with pt.       History of Present Illness: [name] is a 73 y.o. female who is here today for follow-up visit regarding mild to moderate coronary artery disease.   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  Carotid Doppler in March of 2021 showed no significant disease.  Cardiac CTA in June 2021 showed a calcium  score of 274 which was in the 90th percentile for age and sex matched control.  There was evidence of moderate stenosis in the mid LAD and mild disease in the proximal LAD as well as ostial and distal right coronary artery.  None of the lesions were significant by FFR.  Echocardiogram showed normal LV systolic function and mild mitral regurgitation.  No evidence of pulmonary hypertension.   She has been treated medically.  She has been doing extremely well with no chest pain, shortness of breath or palpitations.  She lost 25 pounds over the last year with weight watchers.   Past Medical History:  Diagnosis Date   Arthritis    Breast cancer (HCC) 02/2014   Cancer (HCC)    Vulvular   GERD (gastroesophageal reflux disease)    Hepatitis    Hep B in 1981   PONV (postoperative nausea and vomiting)    pt states scopalamine patch helped and zofran  and phenergan  helped    Past Surgical History:  Procedure Laterality Date   ABDOMINAL HYSTERECTOMY  1997   AUGMENTATION MAMMAPLASTY Right 02/2015   Silicone   BREAST BIOPSY Right    Positive   BREAST EXCISIONAL BIOPSY Left 02/17/2018   Removed fat necrosis    BREAST REDUCTION SURGERY Left 01/18/2015   Procedure:  MAMMARY REDUCTION  (BREAST);  Surgeon: Redell Nettle, MD;  Location: [facility];  Service: Plastics;  Laterality: Left;   BREAST REDUCTION SURGERY Left 02/17/2018   Procedure: Left breast reduction with excision of fat necrosis-no liposuction needed;  Surgeon: Lowery Estefana RAMAN, DO;  Location: [facility];  Service: Plastics;  Laterality: Left;   PLACEMENT OF BREAST IMPLANTS Right 02/17/2018   Procedure: PLACEMENT OF BREAST IMPLANTS;  Surgeon: Lowery Estefana RAMAN, DO;  Location: [facility];  Service: Plastics;  Laterality: Right;   TISSUE EXPANDER PLACEMENT Right 01/18/2015   Procedure: Right  BREAST EXPANDER PLACEMENT WITH ALLODERM;  Surgeon: Redell Nettle, MD;  Location: [facility];  Service: Plastics;  Laterality: Right;  DR NETTLE RM 2 PER BRENDA   TISSUE EXPANDER PLACEMENT Right 03/13/2015   Procedure: Right breast tissue expander removal and Sillicone implant placement.;  Surgeon: Redell Nettle, MD;  Location: [facility];  Service: Plastics;  Laterality: Right;   TOTAL MASTECTOMY Right 02/2013   stage 1 ER/PR+   VULVECTOMY       Current Outpatient Medications  Medication Sig Dispense Refill   aspirin  EC 81 MG tablet Take 1 tablet (81 mg total) by mouth daily. Swallow whole.     Calcium  Carbonate-Vitamin D (CALCIUM  + D PO) Take 1 tablet by mouth 2 (two) times daily.      carvedilol  (COREG )  6.25 MG tablet Take 1 tablet (6.25 mg total) by mouth 2 (two) times daily. 180 tablet 3   ELDERBERRY PO Take by mouth daily. gummies     esomeprazole  (NEXIUM ) 20 MG capsule TAKE 1 CAPSULE BY MOUTH DAILY AT NOON 90 capsule 3   ezetimibe  (ZETIA ) 10 MG tablet Take 1 tablet (10 mg total) by mouth daily. 90 tablet 3   fexofenadine (ALLEGRA) 180 MG tablet Take 180 mg by mouth daily as needed for allergies.      Magnesium  500 MG TABS Take 500 mg by mouth daily.      rosuvastatin  (CRESTOR ) 20 MG tablet Take 1 tablet (20 mg total) by mouth daily. 90 tablet 3   triamcinolone (NASACORT) 55  MCG/ACT AERO nasal inhaler Place 2 sprays into the nose daily as needed (allergies).      Turmeric 500 MG CAPS Take by mouth. Taking 2 caps daily     hydrochlorothiazide  (HYDRODIURIL ) 25 MG tablet TAKE 1 TABLET BY MOUTH EVERY DAY 90 tablet 3   hydrocortisone  (ANUSOL -HC) 2.5 % rectal cream Place 1 Application rectally 2 (two) times daily. 30 g 2   No current facility-administered medications for this visit.    Allergies:   Other, Misc. sulfonamide containing compounds, Penicillins, and Sulfa antibiotics    Social History:  The patient  reports that she has never smoked. She has never used smokeless tobacco. She reports current alcohol use. She reports that she does not use drugs.   Family History:  The patient's family history includes Breast cancer in her cousin and paternal grandmother; Diabetes in her brother and father; Hypertension in her father and mother; Leukemia in her maternal grandmother; Stroke in her father; Thyroid  disease in her mother.    ROS:  Please see the history of present illness.   Otherwise, review of systems are positive for none.   All other systems are reviewed and negative.    PHYSICAL EXAM: VS:  BP 120/88 (BP Location: Left Arm, Patient Position: Sitting, Cuff Size: Normal)   Pulse 72   Ht 5' 5.5 (1.664 m)   Wt 144 lb 8 oz (65.5 kg)   SpO2 98%   BMI 23.68 kg/m  , BMI Body mass index is 23.68 kg/m. GEN: Well nourished, well developed, in no acute distress  HEENT: normal  Neck: no JVD, carotid bruits, or masses Cardiac: RRR; no murmurs, rubs, or gallops, no leg edema Respiratory:  clear to auscultation bilaterally, normal work of breathing GI: soft, nontender, nondistended, + BS MS: no deformity or atrophy  Skin: warm and dry, no rash Neuro:  Strength and sensation are intact Psych: euthymic mood, full affect   EKG:  EKG is ordered today. The ekg ordered today demonstrates: Normal sinus rhythm Inferior infarct , age undetermined Cannot rule out  Anterior infarct , age undetermined When compared with ECG of 27-Nov-2022 09:11, Minimal criteria for Anterior infarct are now Present No significant change was found        Recent Labs: 12/26/2022: ALT 21; BUN 15; Creatinine, Ser 0.93; Hemoglobin 14.9; Platelets 314; Potassium 4.1; Sodium 143; TSH 2.530    Lipid Panel    Component Value Date/Time   CHOL 166 12/26/2022 1136   TRIG 140 12/26/2022 1136   HDL 61 12/26/2022 1136   CHOLHDL 2.7 12/26/2022 1136   CHOLHDL 2.1 02/03/2020 0757   VLDL 17 02/03/2020 0757   LDLCALC 81 12/26/2022 1136      Wt Readings from Last 3 Encounters:  12/10/23 144 lb 8  oz (65.5 kg)  12/26/22 168 lb (76.2 kg)  11/27/22 171 lb (77.6 kg)          08/11/2019    2:27 PM  PAD Screen  Previous PAD dx? No  Previous surgical procedure? No  Pain with walking? No  Feet/toe relief with dangling? No  Painful, non-healing ulcers? No  Extremities discolored? No      ASSESSMENT AND PLAN:  1.  Coronary artery disease involving native coronary arteries without angina: CTA showed mild to moderate nonobstructive coronary artery disease with elevated calcium  score.  Continue aggressive treatment of risk factors.  Continue low-dose aspirin .  2.  Hyperlipidemia: I reviewed most recent lipid profile done in October of last year which showed an LDL of 81 which is above target.  She is currently on rosuvastatin  20 mg daily and ezetimibe  10 mg daily.  Both of these were refilled.  Given weight loss of 25 pounds over the last year, I suspect that her LDL is going to be improved with her next physical in October.  3.  Essential hypertension: Blood pressure is well-controlled on carvedilol  and hydrochlorothiazide .  Both were refilled today.    Disposition:   FU with me in 12 months.  Signed,  Deatrice Cage, MD  12/10/2023 9:31 AM    [facility]

## 2023-12-10 NOTE — Patient Instructions (Signed)
 Medication Instructions:  No changes *If you need a refill on your cardiac medications before your next appointment, please call your pharmacy*  Lab Work: None ordered If you have labs (blood work) drawn today and your tests are completely normal, you will receive your results only by: MyChart Message (if you have MyChart) OR A paper copy in the mail If you have any lab test that is abnormal or we need to change your treatment, we will call you to review the results.  Testing/Procedures: None ordered  Follow-Up: At [facility], you and your health needs are our priority.  As part of our continuing mission to provide you with exceptional heart care, our providers are all part of one team.  This team includes your primary Cardiologist (physician) and Advanced Practice Providers or APPs (Physician Assistants and Nurse Practitioners) who all work together to provide you with the care you need, when you need it.  Your next appointment:   12 month(s)  Provider:   You may see Dr. Kirke Corin or one of the following Advanced Practice Providers on your designated Care Team:   Nicolasa Ducking, NP Ames Dura, PA-C Eula Listen, PA-C Cadence Folsom, PA-C Charlsie Quest, NP Carlos Levering, NP    We recommend signing up for the patient portal called "MyChart".  Sign up information is provided on this After Visit Summary.  MyChart is used to connect with patients for Virtual Visits (Telemedicine).  Patients are able to view lab/test results, encounter notes, upcoming appointments, etc.  Non-urgent messages can be sent to your provider as well.   To learn more about what you can do with MyChart, go to [email or web address].

## 2023-12-11 ENCOUNTER — Other Ambulatory Visit: Payer: Self-pay | Admitting: Internal Medicine

## 2023-12-11 DIAGNOSIS — Z1231 Encounter for screening mammogram for malignant neoplasm of breast: Secondary | ICD-10-CM

## 2023-12-18 ENCOUNTER — Other Ambulatory Visit: Payer: Self-pay | Admitting: Internal Medicine

## 2023-12-18 DIAGNOSIS — K219 Gastro-esophageal reflux disease without esophagitis: Secondary | ICD-10-CM

## 2023-12-18 LAB — COLOGUARD: COLOGUARD: NEGATIVE

## 2023-12-18 NOTE — Telephone Encounter (Signed)
 Courtesy refill. Patient will need an office visit for additional refills.  Requested Prescriptions  Pending Prescriptions Disp Refills   esomeprazole  (NEXIUM ) 20 MG capsule [Pharmacy Med Name: ESOMEPRAZOLE  MAG DR 20 MG CAP] 30 capsule 0    Sig: TAKE 1 CAPSULE BY MOUTH DAILY AT NOON     Gastroenterology: Proton Pump Inhibitors 2 Passed - 12/18/2023  3:29 PM      Passed - ALT in normal range and within 360 days    ALT  Date Value Ref Range Status  12/26/2022 21 0 - 32 IU/L Final         Passed - AST in normal range and within 360 days    AST  Date Value Ref Range Status  12/26/2022 26 0 - 40 IU/L Final         Passed - Valid encounter within last 12 months    Recent Outpatient Visits   None     Future Appointments             In 1 week Justus, Leita DEL, MD [facility] at [facility], [place], [phone]

## 2023-12-29 ENCOUNTER — Encounter: Payer: Self-pay | Admitting: Plastic Surgery

## 2023-12-29 ENCOUNTER — Ambulatory Visit: Admitting: Plastic Surgery

## 2023-12-29 ENCOUNTER — Ambulatory Visit (INDEPENDENT_AMBULATORY_CARE_PROVIDER_SITE_OTHER): Payer: Self-pay | Admitting: Internal Medicine

## 2023-12-29 ENCOUNTER — Encounter: Payer: Self-pay | Admitting: Internal Medicine

## 2023-12-29 VITALS — BP 122/64 | HR 60 | Ht 65.5 in | Wt 144.0 lb

## 2023-12-29 DIAGNOSIS — N651 Disproportion of reconstructed breast: Secondary | ICD-10-CM

## 2023-12-29 DIAGNOSIS — Z853 Personal history of malignant neoplasm of breast: Secondary | ICD-10-CM

## 2023-12-29 DIAGNOSIS — Z9011 Acquired absence of right breast and nipple: Secondary | ICD-10-CM

## 2023-12-29 DIAGNOSIS — M7061 Trochanteric bursitis, right hip: Secondary | ICD-10-CM

## 2023-12-29 DIAGNOSIS — E785 Hyperlipidemia, unspecified: Secondary | ICD-10-CM | POA: Diagnosis not present

## 2023-12-29 DIAGNOSIS — I10 Essential (primary) hypertension: Secondary | ICD-10-CM

## 2023-12-29 DIAGNOSIS — Z08 Encounter for follow-up examination after completed treatment for malignant neoplasm: Secondary | ICD-10-CM

## 2023-12-29 DIAGNOSIS — Z1211 Encounter for screening for malignant neoplasm of colon: Secondary | ICD-10-CM | POA: Diagnosis not present

## 2023-12-29 DIAGNOSIS — Z23 Encounter for immunization: Secondary | ICD-10-CM

## 2023-12-29 DIAGNOSIS — K219 Gastro-esophageal reflux disease without esophagitis: Secondary | ICD-10-CM

## 2023-12-29 DIAGNOSIS — I2510 Atherosclerotic heart disease of native coronary artery without angina pectoris: Secondary | ICD-10-CM

## 2023-12-29 DIAGNOSIS — Z0000 Encounter for general adult medical examination without abnormal findings: Secondary | ICD-10-CM | POA: Diagnosis not present

## 2023-12-29 DIAGNOSIS — R7303 Prediabetes: Secondary | ICD-10-CM | POA: Insufficient documentation

## 2023-12-29 MED ORDER — ESOMEPRAZOLE MAGNESIUM 20 MG PO CPDR: DELAYED_RELEASE_CAPSULE | ORAL | 1 refills | Status: AC

## 2023-12-29 NOTE — Progress Notes (Signed)
 Date:  12/29/2023   Name:  [name]   DOB:  09-Jul-1950   MRN:  [id number]   Chief Complaint: Annual Exam [name] is a 73 y.o. female who presents today for her Complete Annual Exam. She feels well. She reports exercising. She reports she is sleeping well. Breast complaints - none.  Health Maintenance  Topic Date Due   COVID-19 Vaccine (7 - 2025-26 season) 11/16/2023   Breast Cancer Screening  01/28/2024   Medicare Annual Wellness Visit  04/21/2024   Cologuard (Stool DNA test)  12/14/2026   DTaP/Tdap/Td vaccine (2 - Td or Tdap) 07/17/2027   Pneumococcal Vaccine for age over 6  Completed   Flu Shot  Completed   DEXA scan (bone density measurement)  Completed   Hepatitis C Screening  Completed   Zoster (Shingles) Vaccine  Completed   Meningitis B Vaccine  Aged Out   Colon Cancer Screening  Discontinued   Hypertension This is a chronic problem. The problem is controlled. Pertinent negatives include no chest pain, headaches, palpitations or shortness of breath. Past treatments include beta blockers and diuretics. The current treatment provides significant improvement. Hypertensive end-organ damage includes CAD/MI. There is no history of kidney disease or CVA.  Hyperlipidemia This is a chronic problem. The problem is controlled. Pertinent negatives include no chest pain, myalgias or shortness of breath. Current antihyperlipidemic treatment includes statins and ezetimibe . The current treatment provides significant improvement of lipids. There are no compliance problems.   Gastroesophageal Reflux She complains of heartburn. She reports no abdominal pain, no chest pain, no coughing or no wheezing. This is a recurrent problem. The problem occurs rarely. Pertinent negatives include no fatigue. She has tried a PPI for the symptoms.    Review of Systems  Constitutional:  Negative for fatigue and unexpected weight change.  HENT:  Negative for trouble swallowing.   Eyes:   Negative for visual disturbance.  Respiratory:  Negative for cough, chest tightness, shortness of breath and wheezing.   Cardiovascular:  Negative for chest pain, palpitations and leg swelling.  Gastrointestinal:  Positive for heartburn. Negative for abdominal pain, constipation and diarrhea.       Recent mild diverticulitis flare managed with bland diet and fluids  Genitourinary:  Negative for frequency and urgency.  Musculoskeletal:  Positive for arthralgias (mild hip pain intermittently). Negative for myalgias.  Skin:  Negative for color change and rash.  Neurological:  Negative for dizziness, weakness, light-headedness and headaches.  Psychiatric/Behavioral:  Negative for dysphoric mood and sleep disturbance. The patient is not nervous/anxious.      Lab Results  Component Value Date   NA 143 12/26/2022   K 4.1 12/26/2022   CO2 29 12/26/2022   GLUCOSE 98 12/26/2022   BUN 15 12/26/2022   CREATININE 0.93 12/26/2022   CALCIUM  9.9 12/26/2022   EGFR 67 12/26/2022   GFRNONAA >60 06/06/2020   Lab Results  Component Value Date   CHOL 166 12/26/2022   HDL 61 12/26/2022   LDLCALC 81 12/26/2022   TRIG 140 12/26/2022   CHOLHDL 2.7 12/26/2022   Lab Results  Component Value Date   TSH 2.530 12/26/2022   Lab Results  Component Value Date   HGBA1C 6.0 (H) 12/26/2022   Lab Results  Component Value Date   WBC 6.9 12/26/2022   HGB 14.9 12/26/2022   HCT 46.4 12/26/2022   MCV 96 12/26/2022   PLT 314 12/26/2022   Lab Results  Component Value Date   ALT  21 12/26/2022   AST 26 12/26/2022   ALKPHOS 73 12/26/2022   BILITOT 0.2 12/26/2022   No results found for: MARIEN BOLLS, VD25OH   Patient Active Problem List   Diagnosis Date Noted   Prediabetes 12/29/2023   Bursitis of right hip 12/26/2022   Essential hypertension 03/26/2022   Coronary artery disease involving native coronary artery of native heart without angina pectoris 11/01/2019   Gastroesophageal reflux  disease 11/17/2018   Acquired absence of breast 02/09/2018   Breast asymmetry following reconstructive surgery 02/09/2018   Hot flashes 05/28/2016   Environmental and seasonal allergies 12/18/2014   History of carcinoma in situ of vulva 12/18/2014   Alopecia 12/18/2014   Dependent edema 12/18/2014   History of hepatitis B virus infection 12/18/2014   Hx of breast cancer 12/18/2014   Hyperlipidemia, mild 12/18/2014    Allergies  Allergen Reactions   Other Rash   Misc. Sulfonamide Containing Compounds Rash   Penicillins Rash    Has patient had a PCN reaction causing immediate rash, facial/tongue/throat swelling, SOB or lightheadedness with hypotension: No Has patient had a PCN reaction causing severe rash involving mucus membranes or skin necrosis: No Has patient had a PCN reaction that required hospitalization: No Has patient had a PCN reaction occurring within the last 10 years: No If all of the above answers are NO, then may proceed with Cephalosporin use.'   Sulfa Antibiotics Rash    Past Surgical History:  Procedure Laterality Date   ABDOMINAL HYSTERECTOMY  1997   AUGMENTATION MAMMAPLASTY Right 02/2015   Silicone   BREAST BIOPSY Right    Positive   BREAST EXCISIONAL BIOPSY Left 02/17/2018   Removed fat necrosis    BREAST REDUCTION SURGERY Left 01/18/2015   Procedure: MAMMARY REDUCTION  (BREAST);  Surgeon: Redell Nettle, MD;  Location: [facility];  Service: Plastics;  Laterality: Left;   BREAST REDUCTION SURGERY Left 02/17/2018   Procedure: Left breast reduction with excision of fat necrosis-no liposuction needed;  Surgeon: Lowery Estefana RAMAN, DO;  Location: [facility];  Service: Plastics;  Laterality: Left;   cataract     CATARACT EXTRACTION PHACO AND INTRAOCULAR LENS PLACEMENT W/ CORTICOSTEROID Bilateral 05/2023   PLACEMENT OF BREAST IMPLANTS Right 02/17/2018   Procedure: PLACEMENT OF BREAST IMPLANTS;  Surgeon: Lowery Estefana RAMAN, DO;  Location: [facility];   Service: Plastics;  Laterality: Right;   TISSUE EXPANDER PLACEMENT Right 01/18/2015   Procedure: Right  BREAST EXPANDER PLACEMENT WITH ALLODERM;  Surgeon: Redell Nettle, MD;  Location: [facility];  Service: Plastics;  Laterality: Right;  DR NETTLE RM 2 PER BRENDA   TISSUE EXPANDER PLACEMENT Right 03/13/2015   Procedure: Right breast tissue expander removal and Sillicone implant placement.;  Surgeon: Redell Nettle, MD;  Location: [facility];  Service: Plastics;  Laterality: Right;   TOTAL MASTECTOMY Right 02/2013   stage 1 ER/PR+   VULVECTOMY      Social History   Tobacco Use   Smoking status: Never   Smokeless tobacco: Never  Vaping Use   Vaping status: Never Used  Substance Use Topics   Alcohol use: Yes    Comment: 3-5 glasses of wine weekly   Drug use: No     Medication list has been reviewed and updated.  Current Meds  Medication Sig   aspirin  EC 81 MG tablet Take 1 tablet (81 mg total) by mouth daily. Swallow whole.   Calcium  Carbonate-Vitamin D (CALCIUM  + D PO) Take 1 tablet by mouth 2 (two) times  daily.    carvedilol  (COREG ) 6.25 MG tablet Take 1 tablet (6.25 mg total) by mouth 2 (two) times daily.   ELDERBERRY PO Take by mouth daily. gummies   ezetimibe  (ZETIA ) 10 MG tablet Take 1 tablet (10 mg total) by mouth daily.   fexofenadine (ALLEGRA) 180 MG tablet Take 180 mg by mouth daily as needed for allergies.    hydrochlorothiazide  (HYDRODIURIL ) 25 MG tablet Take 1 tablet (25 mg total) by mouth daily.   Magnesium  500 MG TABS Take 500 mg by mouth daily.    rosuvastatin  (CRESTOR ) 20 MG tablet Take 1 tablet (20 mg total) by mouth daily.   triamcinolone (NASACORT) 55 MCG/ACT AERO nasal inhaler Place 2 sprays into the nose daily as needed (allergies).    Turmeric 500 MG CAPS Take by mouth. Taking 2 caps daily   [DISCONTINUED] esomeprazole  (NEXIUM ) 20 MG capsule TAKE 1 CAPSULE BY MOUTH DAILY AT NOON       12/29/2023    1:13 PM 12/26/2022   11:35 AM 03/26/2022     9:10 AM 11/25/2021   10:08 AM  GAD 7 : Generalized Anxiety Score  Nervous, Anxious, on Edge 0 0 0 1  Control/stop worrying 0 0 0 1  Worry too much - different things 0 0 0 1  Trouble relaxing 0 0 0 1  Restless 0 0 0 1  Easily annoyed or irritable 0 0 0 0  Afraid - awful might happen 0 0 0 0  Total GAD 7 Score 0 0 0 5  Anxiety Difficulty Not difficult at all Not difficult at all Not difficult at all Not difficult at all       12/29/2023    1:13 PM 04/22/2023   10:47 AM 12/26/2022   11:35 AM  Depression screen PHQ 2/9  Decreased Interest 0 0 0  Down, Depressed, Hopeless 0 0 0  PHQ - 2 Score 0 0 0  Altered sleeping 0 0 0  Tired, decreased energy 0 0 0  Change in appetite 0 0 0  Feeling bad or failure about yourself  0 0 0  Trouble concentrating 0 0 0  Moving slowly or fidgety/restless 0 0 0  Suicidal thoughts 0 0 0  PHQ-9 Score 0 0 0  Difficult doing work/chores Not difficult at all Not difficult at all Not difficult at all    BP Readings from Last 3 Encounters:  12/29/23 122/64  12/10/23 120/88  12/26/22 126/78    Physical Exam Vitals and nursing note reviewed.  Constitutional:      General: She is not in acute distress.    Appearance: Normal appearance. She is well-developed.  HENT:     Head: Normocephalic and atraumatic.     Right Ear: Tympanic membrane and ear canal normal.     Left Ear: Tympanic membrane and ear canal normal.     Nose:     Right Sinus: No maxillary sinus tenderness.     Left Sinus: No maxillary sinus tenderness.  Eyes:     General: No scleral icterus.       Right eye: No discharge.        Left eye: No discharge.     Conjunctiva/sclera: Conjunctivae normal.  Neck:     Thyroid : No thyromegaly.     Vascular: No carotid bruit.  Cardiovascular:     Rate and Rhythm: Normal rate and regular rhythm.     Pulses: Normal pulses.     Heart sounds: Normal heart sounds.  Pulmonary:  Effort: Pulmonary effort is normal. No respiratory distress.      Breath sounds: No wheezing or rhonchi.  Abdominal:     General: Bowel sounds are normal.     Palpations: Abdomen is soft. There is no mass.     Tenderness: There is no abdominal tenderness. There is no guarding or rebound.     Hernia: No hernia is present.  Musculoskeletal:     Cervical back: Normal range of motion. No erythema.     Right lower leg: No edema.     Left lower leg: No edema.  Lymphadenopathy:     Cervical: No cervical adenopathy.  Skin:    General: Skin is warm and dry.     Capillary Refill: Capillary refill takes less than 2 seconds.     Findings: No rash.  Neurological:     General: No focal deficit present.     Mental Status: She is alert and oriented to person, place, and time.     Cranial Nerves: No cranial nerve deficit.     Sensory: No sensory deficit.     Deep Tendon Reflexes: Reflexes are normal and symmetric.  Psychiatric:        Attention and Perception: Attention normal.        Mood and Affect: Mood normal.        Behavior: Behavior normal.     Wt Readings from Last 3 Encounters:  12/29/23 144 lb (65.3 kg)  12/10/23 144 lb 8 oz (65.5 kg)  12/26/22 168 lb (76.2 kg)    BP 122/64   Pulse 60   Ht 5' 5.5 (1.664 m)   Wt 144 lb (65.3 kg)   SpO2 96%   BMI 23.60 kg/m   Assessment and Plan:  Problem List Items Addressed This Visit       Unprioritized   Hx of breast cancer   Doing well, mammogram scheduled for November      Hyperlipidemia, mild (Chronic)   Relevant Orders   Lipid panel   Gastroesophageal reflux disease (Chronic)   Currently taking Nexium  20 mg with minimal reflux symptoms. Patient denies red flag symptoms - no melena, weight loss, dysphagia. Will maintain current management.       Relevant Medications   esomeprazole  (NEXIUM ) 20 MG capsule   Other Relevant Orders   CBC with Differential/Platelet   Coronary artery disease involving native coronary artery of native heart without angina pectoris (Chronic)   Doing well  without angina or shortness of breath. Recently seen by Cardiology - no testing done. She has lost significant weight this year so lipids should be excellent.      Relevant Orders   Lipid panel   Essential hypertension (Chronic)   Well controlled blood pressure today. Current regimen is Coreg  and hctz. No medication side effects noted.        Relevant Orders   CBC with Differential/Platelet   Comprehensive metabolic panel with GFR   TSH   Urinalysis, Routine w reflex microscopic   Prediabetes   A1C was 6.0 last year. She has been working on diet changes and losing weight. Weight is down 24 lbs from last year with [employer]. Expect significant improvement in A1C.       Relevant Orders   Hemoglobin A1c   Other Visit Diagnoses       Annual physical exam    -  Primary   up to date on screenings and immunizations repeat Cologuard 3 yrs continue healthy diet and exercise  Colon cancer screening       last done in 2015 at an outside hospital No report available Recent Cologuard negative (?ordered by insurance)     Encounter for immunization       Relevant Orders   Flu vaccine HIGH DOSE PF(Fluzone Trivalent) (Completed)       Return in about 6 months (around 06/28/2024) for TOC HTN, lipids, gerd  Dr. Lemon.    Leita HILARIO Adie, MD [facility] [place]

## 2023-12-29 NOTE — Assessment & Plan Note (Signed)
 Currently taking Nexium  20 mg with minimal reflux symptoms. Patient denies red flag symptoms - no melena, weight loss, dysphagia. Will maintain current management.

## 2023-12-29 NOTE — Progress Notes (Signed)
 Patient ID: [name], female    DOB: 10/10/1950, 73 y.o.   MRN: [id number]   Chief Complaint  Patient presents with   Follow-up   Breast Cancer    The patient is a 73 year old female here for 1 year follow-up after breast reconstruction for cancer.  She had a right mastectomy in 2014 with expander and implant placement.  She then had the left side reduced for symmetry.  She was not happy with the asymmetry and had a 445 cc Allergan Inspira implant in place on the right breast.  So we went to the OR in December 2019 and downsized her to a Mentor smooth round high-profile gel 400 cc implant.  We did a left breast reduction for symmetry.  She had some fat necrosis that we excised as well.  She is quite pleased with her results.  She had ultrasound last year on the right breast and that was fine.  She is due for mammogram on the left breast in November.     Review of Systems  Constitutional: Negative.   HENT: Negative.    Eyes: Negative.   Respiratory: Negative.    Cardiovascular: Negative.   Gastrointestinal: Negative.   Endocrine: Negative.   Genitourinary: Negative.   Musculoskeletal: Negative.   Skin: Negative.     Past Medical History:  Diagnosis Date   Arthritis    Breast cancer (HCC) 02/2014   Cancer (HCC)    Vulvular   GERD (gastroesophageal reflux disease)    Hepatitis    Hep B in 1981   PONV (postoperative nausea and vomiting)    pt states scopalamine patch helped and zofran  and phenergan  helped    Past Surgical History:  Procedure Laterality Date   ABDOMINAL HYSTERECTOMY  1997   AUGMENTATION MAMMAPLASTY Right 02/2015   Silicone   BREAST BIOPSY Right    Positive   BREAST EXCISIONAL BIOPSY Left 02/17/2018   Removed fat necrosis    BREAST REDUCTION SURGERY Left 01/18/2015   Procedure: MAMMARY REDUCTION  (BREAST);  Surgeon: Redell Nettle, MD;  Location: [facility];  Service: Plastics;  Laterality: Left;   BREAST REDUCTION SURGERY Left 02/17/2018    Procedure: Left breast reduction with excision of fat necrosis-no liposuction needed;  Surgeon: Lowery Estefana RAMAN, DO;  Location: [facility];  Service: Plastics;  Laterality: Left;   PLACEMENT OF BREAST IMPLANTS Right 02/17/2018   Procedure: PLACEMENT OF BREAST IMPLANTS;  Surgeon: Lowery Estefana RAMAN, DO;  Location: [facility];  Service: Plastics;  Laterality: Right;   TISSUE EXPANDER PLACEMENT Right 01/18/2015   Procedure: Right  BREAST EXPANDER PLACEMENT WITH ALLODERM;  Surgeon: Redell Nettle, MD;  Location: [facility];  Service: Plastics;  Laterality: Right;  DR NETTLE RM 2 PER BRENDA   TISSUE EXPANDER PLACEMENT Right 03/13/2015   Procedure: Right breast tissue expander removal and Sillicone implant placement.;  Surgeon: Redell Nettle, MD;  Location: [facility];  Service: Plastics;  Laterality: Right;   TOTAL MASTECTOMY Right 02/2013   stage 1 ER/PR+   VULVECTOMY        Current Outpatient Medications:    aspirin  EC 81 MG tablet, Take 1 tablet (81 mg total) by mouth daily. Swallow whole., Disp: , Rfl:    Calcium  Carbonate-Vitamin D (CALCIUM  + D PO), Take 1 tablet by mouth 2 (two) times daily. , Disp: , Rfl:    carvedilol  (COREG ) 6.25 MG tablet, Take 1 tablet (6.25 mg total) by mouth 2 (two) times daily., Disp: 180  tablet, Rfl: 3   ELDERBERRY PO, Take by mouth daily. gummies, Disp: , Rfl:    esomeprazole  (NEXIUM ) 20 MG capsule, TAKE 1 CAPSULE BY MOUTH DAILY AT NOON, Disp: 30 capsule, Rfl: 0   ezetimibe  (ZETIA ) 10 MG tablet, Take 1 tablet (10 mg total) by mouth daily., Disp: 90 tablet, Rfl: 3   fexofenadine (ALLEGRA) 180 MG tablet, Take 180 mg by mouth daily as needed for allergies. , Disp: , Rfl:    hydrochlorothiazide  (HYDRODIURIL ) 25 MG tablet, Take 1 tablet (25 mg total) by mouth daily., Disp: 90 tablet, Rfl: 3   hydrocortisone  (ANUSOL -HC) 2.5 % rectal cream, Place 1 Application rectally 2 (two) times daily., Disp: 30 g, Rfl: 2   Magnesium  500 MG TABS, Take 500 mg by mouth daily.  , Disp: , Rfl:    rosuvastatin  (CRESTOR ) 20 MG tablet, Take 1 tablet (20 mg total) by mouth daily., Disp: 90 tablet, Rfl: 3   triamcinolone (NASACORT) 55 MCG/ACT AERO nasal inhaler, Place 2 sprays into the nose daily as needed (allergies). , Disp: , Rfl:    Turmeric 500 MG CAPS, Take by mouth. Taking 2 caps daily, Disp: , Rfl:    Objective:   There were no vitals filed for this visit.  Physical Exam Vitals reviewed.  Constitutional:      Appearance: Normal appearance.  HENT:     Head: Atraumatic.  Cardiovascular:     Rate and Rhythm: Normal rate.     Pulses: Normal pulses.  Pulmonary:     Effort: Pulmonary effort is normal.  Abdominal:     Palpations: Abdomen is soft.  Skin:    Capillary Refill: Capillary refill takes less than 2 seconds.  Neurological:     Mental Status: She is alert and oriented to person, place, and time.  Psychiatric:        Mood and Affect: Mood normal.        Behavior: Behavior normal.        Thought Content: Thought content normal.        Judgment: Judgment normal.     Assessment & Plan:  Breast asymmetry following reconstructive surgery  Acquired absence of right breast  We will plan to see her back in 1 year if she has any questions or concerns she should let us  know.  She knows she can have a nipple areola tattoo at any time as she desires.  Estefana RAMAN Jessamyn Watterson, DO

## 2023-12-29 NOTE — Assessment & Plan Note (Signed)
 Well controlled blood pressure today. Current regimen is Coreg  and hctz. No medication side effects noted.

## 2023-12-29 NOTE — Assessment & Plan Note (Signed)
 Doing well, mammogram scheduled for November

## 2023-12-29 NOTE — Assessment & Plan Note (Signed)
 Doing well without angina or shortness of breath. Recently seen by Cardiology - no testing done. She has lost significant weight this year so lipids should be excellent.

## 2023-12-29 NOTE — Assessment & Plan Note (Addendum)
 A1C was 6.0 last year. She has been working on diet changes and losing weight. Weight is down 24 lbs from last year with [employer]. Expect significant improvement in A1C.

## 2023-12-30 ENCOUNTER — Ambulatory Visit: Payer: Self-pay | Admitting: Internal Medicine

## 2023-12-30 ENCOUNTER — Encounter: Payer: Self-pay | Admitting: Internal Medicine

## 2023-12-30 ENCOUNTER — Other Ambulatory Visit: Payer: Self-pay | Admitting: Internal Medicine

## 2023-12-30 DIAGNOSIS — M7071 Other bursitis of hip, right hip: Secondary | ICD-10-CM

## 2023-12-30 LAB — MICROSCOPIC EXAMINATION
Bacteria, UA: NONE SEEN
Casts: NONE SEEN /LPF

## 2023-12-30 LAB — TSH: TSH: 2.14 u[IU]/mL (ref 0.450–4.500)

## 2023-12-30 LAB — CBC WITH DIFFERENTIAL/PLATELET
Basophils Absolute: 0.1 x10E3/uL (ref 0.0–0.2)
Basos: 1 %
EOS (ABSOLUTE): 0.3 x10E3/uL (ref 0.0–0.4)
Eos: 4 %
Hematocrit: 42.8 % (ref 34.0–46.6)
Hemoglobin: 13.9 g/dL (ref 11.1–15.9)
Immature Grans (Abs): 0 x10E3/uL (ref 0.0–0.1)
Immature Granulocytes: 0 %
Lymphocytes Absolute: 1.5 x10E3/uL (ref 0.7–3.1)
Lymphs: 23 %
MCH: 31.5 pg (ref 26.6–33.0)
MCHC: 32.5 g/dL (ref 31.5–35.7)
MCV: 97 fL (ref 79–97)
Monocytes Absolute: 1.1 x10E3/uL — ABNORMAL HIGH (ref 0.1–0.9)
Monocytes: 18 %
Neutrophils Absolute: 3.3 x10E3/uL (ref 1.4–7.0)
Neutrophils: 54 %
Platelets: 315 x10E3/uL (ref 150–450)
RBC: 4.41 x10E6/uL (ref 3.77–5.28)
RDW: 12.1 % (ref 11.7–15.4)
WBC: 6.2 x10E3/uL (ref 3.4–10.8)

## 2023-12-30 LAB — COMPREHENSIVE METABOLIC PANEL WITH GFR
ALT: 17 IU/L (ref 0–32)
AST: 28 IU/L (ref 0–40)
Albumin: 4.3 g/dL (ref 3.9–4.9)
Alkaline Phosphatase: 62 IU/L (ref 49–135)
BUN/Creatinine Ratio: 21 (ref 12–28)
BUN: 17 mg/dL (ref 8–27)
Bilirubin Total: 0.3 mg/dL (ref 0.0–1.2)
CO2: 26 mmol/L (ref 20–29)
Calcium: 9.5 mg/dL (ref 8.7–10.3)
Chloride: 97 mmol/L (ref 96–106)
Creatinine, Ser: 0.81 mg/dL (ref 0.57–1.00)
Globulin, Total: 2.6 g/dL (ref 1.5–4.5)
Glucose: 91 mg/dL (ref 70–99)
Potassium: 3.5 mmol/L (ref 3.5–5.2)
Sodium: 139 mmol/L (ref 134–144)
Total Protein: 6.9 g/dL (ref 6.0–8.5)
eGFR: 78 mL/min/1.73 (ref >59)

## 2023-12-30 LAB — URINALYSIS, ROUTINE W REFLEX MICROSCOPIC
Bilirubin, UA: NEGATIVE
Glucose, UA: NEGATIVE
Ketones, UA: NEGATIVE
Nitrite, UA: NEGATIVE
Protein,UA: NEGATIVE
RBC, UA: NEGATIVE
Specific Gravity, UA: 1.022 (ref 1.005–1.030)
Urobilinogen, Ur: 0.2 mg/dL (ref 0.2–1.0)
pH, UA: 6 (ref 5.0–7.5)

## 2023-12-30 LAB — HEMOGLOBIN A1C
Est. average glucose Bld gHb Est-mCnc: 123 mg/dL
Hgb A1c MFr Bld: 5.9 % — ABNORMAL HIGH (ref 4.8–5.6)

## 2023-12-30 LAB — LIPID PANEL
Chol/HDL Ratio: 2.6 ratio (ref 0.0–4.4)
Cholesterol, Total: 153 mg/dL (ref 100–199)
HDL: 60 mg/dL (ref >39)
LDL Chol Calc (NIH): 67 mg/dL (ref 0–99)
Triglycerides: 154 mg/dL — ABNORMAL HIGH (ref 0–149)
VLDL Cholesterol Cal: 26 mg/dL (ref 5–40)

## 2023-12-30 MED ORDER — TRAMADOL HCL 50 MG PO TABS: 50.0000 mg | ORAL_TABLET | Freq: Three times a day (TID) | ORAL | 0 refills | Status: AC | PRN

## 2023-12-30 NOTE — Assessment & Plan Note (Signed)
 Intermittent flare ups with excessive physical activity Uses Tramadol  prn - usually about 15 per year.

## 2023-12-30 NOTE — Telephone Encounter (Signed)
 Please review.  KP

## 2023-12-30 NOTE — Telephone Encounter (Signed)
Duplicate message  KP 

## 2023-12-30 NOTE — Progress Notes (Unsigned)
 Date:  12/30/2023   Name:  [name]   DOB:  [birth year]   MRN:  [id number]   Chief Complaint: No chief complaint on file.  HPI  Review of Systems   Lab Results  Component Value Date   NA 139 12/29/2023   K 3.5 12/29/2023   CO2 26 12/29/2023   GLUCOSE 91 12/29/2023   BUN 17 12/29/2023   CREATININE 0.81 12/29/2023   CALCIUM  9.5 12/29/2023   EGFR 78 12/29/2023   GFRNONAA >60 06/06/2020   Lab Results  Component Value Date   CHOL 153 12/29/2023   HDL 60 12/29/2023   LDLCALC 67 12/29/2023   TRIG 154 (H) 12/29/2023   CHOLHDL 2.6 12/29/2023   Lab Results  Component Value Date   TSH 2.140 12/29/2023   Lab Results  Component Value Date   HGBA1C 5.9 (H) 12/29/2023   Lab Results  Component Value Date   WBC 6.2 12/29/2023   HGB 13.9 12/29/2023   HCT 42.8 12/29/2023   MCV 97 12/29/2023   PLT 315 12/29/2023   Lab Results  Component Value Date   ALT 17 12/29/2023   AST 28 12/29/2023   ALKPHOS 62 12/29/2023   BILITOT 0.3 12/29/2023   No results found for: MARIEN BOLLS, VD25OH   Patient Active Problem List   Diagnosis Date Noted   Prediabetes 12/29/2023   Bursitis of right hip 12/26/2022   Essential hypertension 03/26/2022   Coronary artery disease involving native coronary artery of native heart without angina pectoris 11/01/2019   Gastroesophageal reflux disease 11/17/2018   Acquired absence of breast 02/09/2018   Breast asymmetry following reconstructive surgery 02/09/2018   Hot flashes 05/28/2016   Environmental and seasonal allergies 12/18/2014   History of carcinoma in situ of vulva 12/18/2014   Alopecia 12/18/2014   Dependent edema 12/18/2014   History of hepatitis B virus infection 12/18/2014   Hx of breast cancer 12/18/2014   Hyperlipidemia, mild 12/18/2014    Allergies  Allergen Reactions   Other Rash   Misc. Sulfonamide Containing Compounds Rash   Penicillins Rash    Has patient had a PCN reaction causing immediate rash,  facial/tongue/throat swelling, SOB or lightheadedness with hypotension: No Has patient had a PCN reaction causing severe rash involving mucus membranes or skin necrosis: No Has patient had a PCN reaction that required hospitalization: No Has patient had a PCN reaction occurring within the last 10 years: No If all of the above answers are NO, then may proceed with Cephalosporin use.'   Sulfa Antibiotics Rash    Past Surgical History:  Procedure Laterality Date   ABDOMINAL HYSTERECTOMY  1997   AUGMENTATION MAMMAPLASTY Right 02/2015   Silicone   BREAST BIOPSY Right    Positive   BREAST EXCISIONAL BIOPSY Left 02/17/2018   Removed fat necrosis    BREAST REDUCTION SURGERY Left 01/18/2015   Procedure: MAMMARY REDUCTION  (BREAST);  Surgeon: Redell Nettle, MD;  Location: [facility];  Service: Plastics;  Laterality: Left;   BREAST REDUCTION SURGERY Left 02/17/2018   Procedure: Left breast reduction with excision of fat necrosis-no liposuction needed;  Surgeon: Lowery Estefana RAMAN, DO;  Location: [facility];  Service: Plastics;  Laterality: Left;   cataract     CATARACT EXTRACTION PHACO AND INTRAOCULAR LENS PLACEMENT W/ CORTICOSTEROID Bilateral 05/2023   PLACEMENT OF BREAST IMPLANTS Right 02/17/2018   Procedure: PLACEMENT OF BREAST IMPLANTS;  Surgeon: Lowery Estefana RAMAN, DO;  Location: [facility];  Service: Plastics;  Laterality: Right;  TISSUE EXPANDER PLACEMENT Right 01/18/2015   Procedure: Right  BREAST EXPANDER PLACEMENT WITH ALLODERM;  Surgeon: Redell Nettle, MD;  Location: [facility];  Service: Plastics;  Laterality: Right;  DR NETTLE RM 2 PER BRENDA   TISSUE EXPANDER PLACEMENT Right 03/13/2015   Procedure: Right breast tissue expander removal and Sillicone implant placement.;  Surgeon: Redell Nettle, MD;  Location: [facility];  Service: Plastics;  Laterality: Right;   TOTAL MASTECTOMY Right 02/2013   stage 1 ER/PR+   VULVECTOMY      Social History   Tobacco Use    Smoking status: Never   Smokeless tobacco: Never  Vaping Use   Vaping status: Never Used  Substance Use Topics   Alcohol use: Yes    Comment: 3-5 glasses of wine weekly   Drug use: No     Medication list has been reviewed and updated.  No outpatient medications have been marked as taking for the 12/30/23 encounter (Orders Only) with Justus Leita DEL, MD.       12/29/2023    1:13 PM 12/26/2022   11:35 AM 03/26/2022    9:10 AM 11/25/2021   10:08 AM  GAD 7 : Generalized Anxiety Score  Nervous, Anxious, on Edge 0 0 0 1  Control/stop worrying 0 0 0 1  Worry too much - different things 0 0 0 1  Trouble relaxing 0 0 0 1  Restless 0 0 0 1  Easily annoyed or irritable 0 0 0 0  Afraid - awful might happen 0 0 0 0  Total GAD 7 Score 0 0 0 5  Anxiety Difficulty Not difficult at all Not difficult at all Not difficult at all Not difficult at all       12/29/2023    1:13 PM 04/22/2023   10:47 AM 12/26/2022   11:35 AM  Depression screen PHQ 2/9  Decreased Interest 0 0 0  Down, Depressed, Hopeless 0 0 0  PHQ - 2 Score 0 0 0  Altered sleeping 0 0 0  Tired, decreased energy 0 0 0  Change in appetite 0 0 0  Feeling bad or failure about yourself  0 0 0  Trouble concentrating 0 0 0  Moving slowly or fidgety/restless 0 0 0  Suicidal thoughts 0 0 0  PHQ-9 Score 0 0 0  Difficult doing work/chores Not difficult at all Not difficult at all Not difficult at all    BP Readings from Last 3 Encounters:  12/29/23 122/64  12/10/23 120/88  12/26/22 126/78    Physical Exam  Wt Readings from Last 3 Encounters:  12/29/23 144 lb (65.3 kg)  12/10/23 144 lb 8 oz (65.5 kg)  12/26/22 168 lb (76.2 kg)    There were no vitals taken for this visit.  Assessment and Plan:  Problem List Items Addressed This Visit   None   No follow-ups on file.    Leita HILARIO Justus, MD [facility] [place]

## 2023-12-30 NOTE — Addendum Note (Signed)
 Addended by: JUSTUS LEITA DEL on: 12/30/2023 12:31 PM   Modules accepted: Level of Service

## 2024-01-29 ENCOUNTER — Encounter: Payer: Self-pay | Admitting: Internal Medicine

## 2024-01-29 ENCOUNTER — Other Ambulatory Visit: Payer: Self-pay | Admitting: Internal Medicine

## 2024-01-29 ENCOUNTER — Ambulatory Visit
Admission: RE | Admit: 2024-01-29 | Discharge: 2024-01-29 | Disposition: A | Discharge status: Home / Self Care | Source: Ambulatory Visit | Attending: Internal Medicine | Admitting: Internal Medicine

## 2024-01-29 DIAGNOSIS — Z1231 Encounter for screening mammogram for malignant neoplasm of breast: Secondary | ICD-10-CM | POA: Insufficient documentation

## 2024-03-31 ENCOUNTER — Ambulatory Visit (INDEPENDENT_AMBULATORY_CARE_PROVIDER_SITE_OTHER): Admitting: Student

## 2024-03-31 ENCOUNTER — Encounter: Payer: Self-pay | Admitting: Student

## 2024-03-31 VITALS — BP 120/84 | HR 94 | Temp 98.6°F | Ht 65.5 in | Wt 142.4 lb

## 2024-03-31 DIAGNOSIS — R7303 Prediabetes: Secondary | ICD-10-CM

## 2024-03-31 DIAGNOSIS — K5792 Diverticulitis of intestine, part unspecified, without perforation or abscess without bleeding: Secondary | ICD-10-CM

## 2024-03-31 MED ORDER — CIPROFLOXACIN HCL 500 MG PO TABS: 500.0000 mg | ORAL_TABLET | Freq: Two times a day (BID) | ORAL | 0 refills | Status: AC

## 2024-03-31 MED ORDER — LANCETS MISC: 1.0000 | 0 refills | Status: AC

## 2024-03-31 MED ORDER — TRAMADOL HCL 50 MG PO TABS: 50.0000 mg | ORAL_TABLET | Freq: Three times a day (TID) | ORAL | 0 refills | Status: AC | PRN

## 2024-03-31 MED ORDER — BLOOD GLUCOSE TEST VI STRP: 1.0000 | ORAL_STRIP | 0 refills | Status: AC

## 2024-03-31 MED ORDER — LANCET DEVICE MISC: 1.0000 | 0 refills | Status: AC

## 2024-03-31 MED ORDER — BLOOD GLUCOSE MONITORING SUPPL DEVI: 1.0000 | 0 refills | Status: AC

## 2024-03-31 NOTE — Progress Notes (Signed)
 "  Established Patient Office Visit  Subjective   Patient ID: [name], female    DOB: 03-05-1951  Age: 74 y.o. MRN: [id number]  Chief Complaint  Patient presents with   Diverticulosis    Flare started Friday, lower right quadrant, getting better, still has lingering pain     [name] is a 74 y.o. person with medical hx listed below who presents today for  RLQ pain for the past week. Pain is constant and cramping, pain has improved in severity. Has been eating a liquid diet and taking tramadol . Appetite is a little low. Some constipation. No fever, n/v/d, hematochezia currently.   Patient Active Problem List   Diagnosis Date Noted   Prediabetes 12/29/2023   Bursitis of right hip 12/26/2022   Essential hypertension 03/26/2022   Coronary artery disease involving native coronary artery of native heart without angina pectoris 11/01/2019   Gastroesophageal reflux disease 11/17/2018   Acquired absence of breast 02/09/2018   Breast asymmetry following reconstructive surgery 02/09/2018   Hot flashes 05/28/2016   Environmental and seasonal allergies 12/18/2014   History of carcinoma in situ of vulva 12/18/2014   Alopecia 12/18/2014   Dependent edema 12/18/2014   History of hepatitis B virus infection 12/18/2014   Hx of breast cancer 12/18/2014   Hyperlipidemia, mild 12/18/2014      ROS Refer to HPI    Objective:     Outpatient Encounter Medications as of 03/31/2024  Medication Sig   aspirin  EC 81 MG tablet Take 1 tablet (81 mg total) by mouth daily. Swallow whole.   Blood Glucose Monitoring Suppl DEVI 1 each by Does not apply route as directed. Dispense based on patient and insurance preference. Use up to four times daily as directed. (FOR ICD-10 E10.9, E11.9).   Calcium  Carbonate-Vitamin D (CALCIUM  + D PO) Take 1 tablet by mouth 2 (two) times daily.    carvedilol  (COREG ) 6.25 MG tablet Take 1 tablet (6.25 mg total) by mouth 2 (two) times daily.   [EXPIRED]  ciprofloxacin  (CIPRO ) 500 MG tablet Take 1 tablet (500 mg total) by mouth 2 (two) times daily for 7 days.   ELDERBERRY PO Take by mouth daily. gummies   esomeprazole  (NEXIUM ) 20 MG capsule TAKE 1 CAPSULE BY MOUTH DAILY AT NOON   ezetimibe  (ZETIA ) 10 MG tablet Take 1 tablet (10 mg total) by mouth daily.   fexofenadine (ALLEGRA) 180 MG tablet Take 180 mg by mouth daily as needed for allergies.    Glucose Blood (BLOOD GLUCOSE TEST STRIPS) STRP 1 each by Does not apply route as directed. Dispense based on patient and insurance preference. Use up to four times daily as directed. (FOR ICD-10 E10.9, E11.9).   hydrochlorothiazide  (HYDRODIURIL ) 25 MG tablet Take 1 tablet (25 mg total) by mouth daily.   Lancet Device MISC 1 each by Does not apply route as directed. Dispense based on patient and insurance preference. Use up to four times daily as directed. (FOR ICD-10 E10.9, E11.9).   Lancets MISC 1 each by Does not apply route as directed. Dispense based on patient and insurance preference. Use up to four times daily as directed. (FOR ICD-10 E10.9, E11.9).   Magnesium  500 MG TABS Take 500 mg by mouth daily.    rosuvastatin  (CRESTOR ) 20 MG tablet Take 1 tablet (20 mg total) by mouth daily.   [EXPIRED] traMADol  (ULTRAM ) 50 MG tablet Take 1 tablet (50 mg total) by mouth every 8 (eight) hours as needed for up to 5 days.  triamcinolone (NASACORT) 55 MCG/ACT AERO nasal inhaler Place 2 sprays into the nose daily as needed (allergies).    Turmeric 500 MG CAPS Take by mouth. Taking 2 caps daily   No facility-administered encounter medications on file as of 03/31/2024.    BP 120/84   Pulse 94   Temp 98.6 F (37 C) (Oral)   Ht 5' 5.5 (1.664 m)   Wt 142 lb 6 oz (64.6 kg)   SpO2 96%   BMI 23.33 kg/m  BP Readings from Last 3 Encounters:  03/31/24 120/84  12/29/23 122/64  12/10/23 120/88    Physical Exam Constitutional:      Appearance: Normal appearance.  HENT:     Mouth/Throat:     Mouth: Mucous  membranes are moist.     Pharynx: Oropharynx is clear.  Cardiovascular:     Rate and Rhythm: Normal rate and regular rhythm.  Pulmonary:     Effort: Pulmonary effort is normal.     Breath sounds: No rhonchi or rales.  Abdominal:     General: Abdomen is flat. Bowel sounds are normal. There is no distension.     Palpations: Abdomen is soft.     Tenderness: There is abdominal tenderness (Right lower quadrant). There is no guarding or rebound.  Musculoskeletal:        General: Normal range of motion.     Right lower leg: No edema.     Left lower leg: No edema.  Skin:    General: Skin is warm and dry.     Capillary Refill: Capillary refill takes less than 2 seconds.  Neurological:     General: No focal deficit present.     Mental Status: She is alert and oriented to person, place, and time.  Psychiatric:        Mood and Affect: Mood normal.        Behavior: Behavior normal.        03/31/2024   10:23 AM 12/29/2023    1:13 PM 04/22/2023   10:47 AM  Depression screen PHQ 2/9  Decreased Interest 0 0 0  Down, Depressed, Hopeless 0 0 0  PHQ - 2 Score 0 0 0  Altered sleeping  0 0  Tired, decreased energy  0 0  Change in appetite  0 0  Feeling bad or failure about yourself   0 0  Trouble concentrating  0 0  Moving slowly or fidgety/restless  0 0  Suicidal thoughts  0 0  PHQ-9 Score  0  0   Difficult doing work/chores  Not difficult at all Not difficult at all     Data saved with a previous flowsheet row definition       03/31/2024   10:23 AM 12/29/2023    1:13 PM 12/26/2022   11:35 AM 03/26/2022    9:10 AM  GAD 7 : Generalized Anxiety Score  Nervous, Anxious, on Edge 0  0  0  0   Control/stop worrying 0  0  0  0   Worry too much - different things  0  0  0   Trouble relaxing  0  0  0   Restless  0  0  0   Easily annoyed or irritable  0  0  0   Afraid - awful might happen  0  0  0   Total GAD 7 Score  0 0 0  Anxiety Difficulty  Not difficult at all Not difficult at all Not  difficult at  all     Data saved with a previous flowsheet row definition    No results found for any visits on 03/31/24.    The 10-year ASCVD risk score (Arnett DK, et al., 2019) is: 11.4%    Assessment & Plan:  Diverticulitis Likely flare of her diverticulitis.  Unable to tolerate linezolid will treat with Cipro  and pain control with tramadol .  Follow-up if symptoms or not improving. Prediabetes Assessment & Plan: Refilled meter supplies.   Other orders -     Ciprofloxacin  HCl; Take 1 tablet (500 mg total) by mouth 2 (two) times daily for 7 days.  Dispense: 14 tablet; Refill: 0 -     traMADol  HCl; Take 1 tablet (50 mg total) by mouth every 8 (eight) hours as needed for up to 5 days.  Dispense: 15 tablet; Refill: 0 -     Blood Glucose Monitoring Suppl; 1 each by Does not apply route as directed. Dispense based on patient and insurance preference. Use up to four times daily as directed. (FOR ICD-10 E10.9, E11.9).  Dispense: 1 each; Refill: 0 -     Blood Glucose Test; 1 each by Does not apply route as directed. Dispense based on patient and insurance preference. Use up to four times daily as directed. (FOR ICD-10 E10.9, E11.9).  Dispense: 100 strip; Refill: 0 -     Lancet Device; 1 each by Does not apply route as directed. Dispense based on patient and insurance preference. Use up to four times daily as directed. (FOR ICD-10 E10.9, E11.9).  Dispense: 1 each; Refill: 0 -     Lancets; 1 each by Does not apply route as directed. Dispense based on patient and insurance preference. Use up to four times daily as directed. (FOR ICD-10 E10.9, E11.9).  Dispense: 100 each; Refill: 0     No follow-ups on file.    Harlene Saddler, MD "

## 2024-04-09 NOTE — Assessment & Plan Note (Signed)
 Refilled meter supplies.

## 2024-04-19 ENCOUNTER — Encounter: Payer: Self-pay | Admitting: Student

## 2024-04-19 DIAGNOSIS — K5792 Diverticulitis of intestine, part unspecified, without perforation or abscess without bleeding: Secondary | ICD-10-CM

## 2024-04-19 NOTE — Telephone Encounter (Signed)
 With or with out contrast?

## 2024-04-19 NOTE — Telephone Encounter (Signed)
 Please advise patient. JM

## 2024-04-29 ENCOUNTER — Ambulatory Visit

## 2024-06-28 ENCOUNTER — Encounter: Admitting: Student

## 2024-07-21 ENCOUNTER — Ambulatory Visit

## 2025-01-03 ENCOUNTER — Ambulatory Visit: Admitting: Plastic Surgery
